# Patient Record
Sex: Male | Born: 1960 | Race: White | Hispanic: No | Smoking: Former smoker
Health system: Southern US, Community
[De-identification: ages and names within clinical notes are randomized; demographics above are authoritative.]

## PROBLEM LIST (undated history)

## (undated) DIAGNOSIS — Z79899 Other long term (current) drug therapy: Secondary | ICD-10-CM

## (undated) DIAGNOSIS — J449 Chronic obstructive pulmonary disease, unspecified: Secondary | ICD-10-CM

## (undated) DIAGNOSIS — I10 Essential (primary) hypertension: Secondary | ICD-10-CM

## (undated) DIAGNOSIS — E669 Obesity, unspecified: Secondary | ICD-10-CM

## (undated) DIAGNOSIS — Z5181 Encounter for therapeutic drug level monitoring: Secondary | ICD-10-CM

## (undated) DIAGNOSIS — G4733 Obstructive sleep apnea (adult) (pediatric): Secondary | ICD-10-CM

## (undated) DIAGNOSIS — I4819 Other persistent atrial fibrillation: Secondary | ICD-10-CM

## (undated) DIAGNOSIS — I5032 Chronic diastolic (congestive) heart failure: Secondary | ICD-10-CM

## (undated) HISTORY — DX: Other persistent atrial fibrillation: I48.19

## (undated) HISTORY — DX: Chronic diastolic (congestive) heart failure: I50.32

## (undated) HISTORY — DX: Obesity, unspecified: E66.9

## (undated) HISTORY — DX: Obstructive sleep apnea (adult) (pediatric): G47.33

## (undated) HISTORY — DX: Encounter for therapeutic drug level monitoring: Z79.899

## (undated) HISTORY — PX: HEMORROIDECTOMY: SUR656

## (undated) HISTORY — DX: Other long term (current) drug therapy: Z51.81

## (undated) HISTORY — PX: HERNIA REPAIR: SHX51

---

## 2015-10-29 ENCOUNTER — Ambulatory Visit: Payer: Self-pay | Admitting: Family Medicine

## 2015-11-09 ENCOUNTER — Ambulatory Visit: Payer: Self-pay | Admitting: Family Medicine

## 2016-12-15 ENCOUNTER — Other Ambulatory Visit: Payer: Self-pay | Admitting: Cardiology

## 2016-12-15 DIAGNOSIS — E669 Obesity, unspecified: Secondary | ICD-10-CM

## 2016-12-15 DIAGNOSIS — Z7901 Long term (current) use of anticoagulants: Secondary | ICD-10-CM

## 2016-12-15 DIAGNOSIS — I4819 Other persistent atrial fibrillation: Secondary | ICD-10-CM

## 2016-12-15 HISTORY — DX: Other persistent atrial fibrillation: I48.19

## 2016-12-15 HISTORY — DX: Obesity, unspecified: E66.9

## 2016-12-15 NOTE — H&P (Signed)
[name] [name], [name]    Date of visit:  12/15/2016 DOB:  [birth year]    Age:  56 yrs. Medical record number:  [id number]     Account number:  [id number] Primary Care Provider: Windle GuardELKINS, WILSON ____________________________ CURRENT DIAGNOSES  1. Dyspnea  2. Chest pain, unspecified  3. Persistent atrial fibrillation  4. COPD  5. Gastro-esophageal reflux disease  6. Obesity ____________________________ ALLERGIES  No Known Drug Allergies ____________________________ MEDICATIONS  1. Bevespi Aerosphere 9 mcg-4.8 mcg HFA aerosol inhaler, 2 puffs BID  2. carvedilol 12.5 mg tablet, take 2 tablets twice a day  3. flecainide 50 mg tablet, BID  4. pantoprazole 40 mg tablet,delayed release, 1 p.o. daily  5. Xarelto 20 mg tablet, 1 p.o. daily ____________________________ CHIEF COMPLAINTS  Followup of Persistent atrial fibrillation ____________________________ HISTORY OF PRESENT ILLNESS  Returns for cardiac followup. He was initially seen 3 weeks ago for evaluation of persistnet atrial fibrillation and echocardiogram showed EF of 55% with mild left atrial enlargement, mild LVH. No prior history of hypertension.He continues to complain of episodic dyspnea and remains in atrial fibrillation. He has been taking his carvedilol at a higher dose and states he has not missed any doses of anticoagulation. He is unclear how long his heart has been out of rhythm. He denies PND, orthopnea or significant edema. Has been anticoagulated now for close to one month. ____________________________ PAST HISTORY  Past Medical Illnesses:  GERD, COPD, erectile dysfunction, colitis, obesity;  Cardiovascular Illnesses:  angina (chronic), atrial fibrillation;  Infectious Diseases:  no previous history of significant infectious diseases;  Surgical Procedures:  hernia repair, hemmoroidectomy;  Trauma History:  no previous history of significant trauma;  NYHA Classification:  I;  Canadian Angina Classification:  Class 0: Asymptomatic;   Cardiology Procedures-Invasive:  no previous interventional or invasive cardiology procedures;  Cardiology Procedures-Noninvasive:  echocardiogram October 2018;  Peripheral Vascular Procedures:  no previous invasive peripheral vascular procedures.;  LVEF of 55% documented via echocardiogram on 12/01/2016,   ____________________________ CARDIO-PULMONARY TEST DATES EKG Date:  12/15/2016;  Echocardiography Date: 12/01/2016;   ____________________________ FAMILY HISTORY Brother -- Atrial fibrillation, Coronary artery bypass grafting, Brother alive with problem Brother -- Armed forces training and education officerAlive and well Father -- Father dead, COPD Mother -- Mother dead, COPD Sister -- Sister alive and well ____________________________ SOCIAL HISTORY Alcohol Use:  5-6 beers daily;  Smoking:  used to smoke but quit 2010, 30 pack year history;  Diet:  regular diet;  Lifestyle:  divorced and 1 daughter;  Exercise:  no regular exercise;  Occupation:  works part time;  Residence:  lives alone;   ____________________________ REVIEW OF SYSTEMS General:  obesity Ears, Nose, Throat, Mouth:  denies any hearing loss, epistaxis, hoarseness or difficulty speaking. Respiratory: denies dyspnea, cough, wheezing or hemoptysis. Cardiovascular:  please review HPI Abdominal: diarrhea Musculoskeletal:  denies arthritis, venous insufficiency, or muscle weakness Neurological:  denies headaches, stroke, or TIA  ____________________________ PHYSICAL EXAMINATION VITAL SIGNS  Blood Pressure:  114/70 Sitting, Left arm, large cuff  , 120/66 Standing, Left arm and large cuff   Pulse:  80/min. Weight:  222.00 lbs. Height:  70.00"BMI: 32  Constitutional:  pleasant white male in no acute distress, moderately obese Skin:  warm and dry to touch, no apparent skin lesions, or masses noted. Head:  normocephalic, normal hair pattern, no masses or tenderness Eyes:  EOMS Intact, PERRLA, C and S clear, Funduscopic exam not done. ENT:  ears, nose and throat reveal no  gross abnormalities.  Dentition good. Neck:  supple, without massess. No  JVD, thyromegaly or carotid bruits. Carotid upstroke normal. Chest:  normal symmetry, clear to auscultation. Cardiac:  irregular rhythm, normal S1 and S2, no S3 or S4, no murmur Abdomen:  abdomen soft,non-tender, no masses, no hepatospenomegaly, or aneurysm noted Peripheral Pulses:  the femoral,dorsalis pedis, and posterior tibial pulses are full and equal bilaterally with no bruits auscultated. Extremities & Back:  no deformities, clubbing, cyanosis, erythema or edema observed. Normal muscle strength and tone. Neurological:  no gross motor or sensory deficits noted, affect appropriate, oriented x3. ____________________________ IMPRESSIONS/PLAN  1. Persistent atrial fibrillation that is symptomatic 2. Long-term use of anticoagulation 3. Obesity  Recommendations:  I asked him to begin flecainide 50 mg twice daily.  He was also told to increase his carvedilol to 25 mg twice daily. We'll plan cardioversion next week to get him back into sinus rhythm. Cardioversion discussed with the patient including risks of stroke, arrhythmia, death, or anesthesia risks. The patient understands and is willing to proceed. ____________________________ TODAYS ORDERS  1. Comprehensive Metabolic Panel: Today  2. Complete Blood Count: Today  3. 12 Lead EKG: Today  4. Schedule cardioversion                       ____________________________ Cardiology Physician:  Darden PalmerW. Spencer Kindle Strohmeier, Jr. MD [facility]

## 2016-12-22 ENCOUNTER — Ambulatory Visit (HOSPITAL_COMMUNITY): Payer: Medicaid Other | Admitting: Certified Registered Nurse Anesthetist

## 2016-12-22 ENCOUNTER — Ambulatory Visit (HOSPITAL_COMMUNITY)
Admission: RE | Admit: 2016-12-22 | Discharge: 2016-12-22 | Disposition: A | Discharge status: Home / Self Care | Payer: Medicaid Other | Source: Ambulatory Visit | Attending: Cardiology | Admitting: Cardiology

## 2016-12-22 ENCOUNTER — Encounter (HOSPITAL_COMMUNITY)
Admission: RE | Disposition: A | Discharge status: Home / Self Care | Payer: Self-pay | Source: Ambulatory Visit | Attending: Cardiology

## 2016-12-22 ENCOUNTER — Encounter (HOSPITAL_COMMUNITY): Payer: Self-pay | Admitting: *Deleted

## 2016-12-22 DIAGNOSIS — E669 Obesity, unspecified: Secondary | ICD-10-CM

## 2016-12-22 DIAGNOSIS — I119 Hypertensive heart disease without heart failure: Secondary | ICD-10-CM | POA: Diagnosis not present

## 2016-12-22 DIAGNOSIS — K219 Gastro-esophageal reflux disease without esophagitis: Secondary | ICD-10-CM | POA: Diagnosis not present

## 2016-12-22 DIAGNOSIS — N529 Male erectile dysfunction, unspecified: Secondary | ICD-10-CM | POA: Insufficient documentation

## 2016-12-22 DIAGNOSIS — I517 Cardiomegaly: Secondary | ICD-10-CM | POA: Diagnosis not present

## 2016-12-22 DIAGNOSIS — Z7901 Long term (current) use of anticoagulants: Secondary | ICD-10-CM

## 2016-12-22 DIAGNOSIS — Z6831 Body mass index (BMI) 31.0-31.9, adult: Secondary | ICD-10-CM | POA: Diagnosis not present

## 2016-12-22 DIAGNOSIS — Z87891 Personal history of nicotine dependence: Secondary | ICD-10-CM | POA: Insufficient documentation

## 2016-12-22 DIAGNOSIS — Z836 Family history of other diseases of the respiratory system: Secondary | ICD-10-CM | POA: Insufficient documentation

## 2016-12-22 DIAGNOSIS — Z8249 Family history of ischemic heart disease and other diseases of the circulatory system: Secondary | ICD-10-CM | POA: Insufficient documentation

## 2016-12-22 DIAGNOSIS — Z79899 Other long term (current) drug therapy: Secondary | ICD-10-CM | POA: Insufficient documentation

## 2016-12-22 DIAGNOSIS — J449 Chronic obstructive pulmonary disease, unspecified: Secondary | ICD-10-CM | POA: Diagnosis not present

## 2016-12-22 DIAGNOSIS — I481 Persistent atrial fibrillation: Secondary | ICD-10-CM | POA: Diagnosis not present

## 2016-12-22 DIAGNOSIS — I4819 Other persistent atrial fibrillation: Secondary | ICD-10-CM

## 2016-12-22 HISTORY — DX: Chronic obstructive pulmonary disease, unspecified: J44.9

## 2016-12-22 HISTORY — DX: Essential (primary) hypertension: I10

## 2016-12-22 HISTORY — PX: CARDIOVERSION: SHX1299

## 2016-12-22 SURGERY — CARDIOVERSION
Anesthesia: General

## 2016-12-22 MED ORDER — PROPOFOL 10 MG/ML IV BOLUS
INTRAVENOUS | Status: DC | PRN
  Administered 2016-12-22: 50 mg via INTRAVENOUS
  Administered 2016-12-22: 100 mg via INTRAVENOUS

## 2016-12-22 MED ORDER — HYDROCORTISONE 1 % EX CREA: 1.0000 "application " | TOPICAL_CREAM | Freq: Three times a day (TID) | CUTANEOUS | Status: DC | PRN

## 2016-12-22 MED ORDER — SODIUM CHLORIDE 0.9 % IV SOLN
INTRAVENOUS | Status: DC
  Administered 2016-12-22: 13:00:00 via INTRAVENOUS

## 2016-12-22 MED ORDER — LIDOCAINE 2% (20 MG/ML) 5 ML SYRINGE
INTRAMUSCULAR | Status: DC | PRN
Start: 2016-12-22 — End: 2016-12-22
  Administered 2016-12-22: 20 mg via INTRAVENOUS

## 2016-12-22 NOTE — CV Procedure (Signed)
Electrical Cardioversion Procedure Note  [name] [name]   56 y.o. male MRN: [id number] DOB: 02/13/1960  Today's date: 12/22/2016  Procedure: Electrical Cardioversion  Indications:  Atrial Fibrillation  Time Out: Verified patient identification, verified procedure,medications/allergies/relevent history reviewed, required imaging and test results available.  Performed  Procedure Details  The patient was NPO after midnight. Anesthesia was administered at the beside  by Dr.Jackson with 20 mg of lidocaine and 100 mg and a subsequent bolus of 50 mg of propofol.  Cardioversion was done with synchronized biphasic defibrillation with AP pads with 150, 200 and 200 watts.  The patient failed to convert to sinus rhythm. The patient tolerated the procedure well   IMPRESSION:  Unsucessful cardioversion of atrial fibrillation    [name] [name], [name] HagemanJr. MD [facility]   12/22/2016, 1:25 PM

## 2016-12-22 NOTE — Anesthesia Postprocedure Evaluation (Signed)
Anesthesia Post Note  Patient: [name] [name]  Procedure(s) Performed: CARDIOVERSION (N/A )     Patient location during evaluation: Endoscopy Anesthesia Type: General Level of consciousness: awake and alert, patient cooperative and oriented Pain management: pain level controlled Vital Signs Assessment: post-procedure vital signs reviewed and stable Respiratory status: spontaneous breathing, nonlabored ventilation and respiratory function stable Cardiovascular status: blood pressure returned to baseline and stable (remains in afib) Postop Assessment: no apparent nausea or vomiting Anesthetic complications: no    Last Vitals:  Vitals:   12/22/16 1319 12/22/16 1334  BP:  107/64  Pulse:  71  Resp: 15 16  Temp:  36.5 C  SpO2:  97%    Last Pain:  Vitals:   12/22/16 1334  TempSrc: Oral                 [name] [name],[name] [name]

## 2016-12-22 NOTE — Anesthesia Preprocedure Evaluation (Addendum)
Anesthesia Evaluation  Patient identified by MRN, date of birth, ID band Patient awake    Reviewed: Allergy & Precautions, NPO status , Patient's Chart, lab work & pertinent test results, reviewed documented beta blocker date and time   History of Anesthesia Complications Negative for: history of anesthetic complications  Airway Mallampati: II  TM Distance: >3 FB Neck ROM: Full    Dental  (+) Upper Dentures, Poor Dentition, Loose, Chipped, Dental Advisory Given, Missing   Pulmonary COPD,  COPD inhaler, former smoker,    breath sounds clear to auscultation       Cardiovascular hypertension, Pt. on medications and Pt. on home beta blockers + dysrhythmias Atrial Fibrillation  Rhythm:Irregular Rate:Tachycardia  EF 55% (Dr. Donnie Ahoilley)   Neuro/Psych negative neurological ROS     GI/Hepatic Neg liver ROS, GERD  Medicated and Controlled,  Endo/Other  Morbid obesity  Renal/GU negative Renal ROS     Musculoskeletal   Abdominal (+) + obese,   Peds  Hematology Xarelto   Anesthesia Other Findings   Reproductive/Obstetrics                           Anesthesia Physical Anesthesia Plan  ASA: III  Anesthesia Plan: General   Post-op Pain Management:    Induction: Intravenous  PONV Risk Score and Plan: 2 and Treatment may vary due to age or medical condition  Airway Management Planned: Natural Airway and Mask  Additional Equipment:   Intra-op Plan:   Post-operative Plan:   Informed Consent: I have reviewed the patients History and Physical, chart, labs and discussed the procedure including the risks, benefits and alternatives for the proposed anesthesia with the patient or authorized representative who has indicated his/her understanding and acceptance.   Dental advisory given  Plan Discussed with: CRNA and Surgeon  Anesthesia Plan Comments: (Plan routine monitors, GA)        Anesthesia  Quick Evaluation

## 2016-12-22 NOTE — Transfer of Care (Signed)
Immediate Anesthesia Transfer of Care Note  Patient: [name] [name]  Procedure(s) Performed: CARDIOVERSION (N/A )  Patient Location: Endoscopy Unit  Anesthesia Type:General  Level of Consciousness: awake, alert  and oriented  Airway & Oxygen Therapy: Patient Spontanous Breathing  Post-op Assessment: Report given to RN and Post -op Vital signs reviewed and stable  Post vital signs: Reviewed and stable  Last Vitals:  Vitals:   12/22/16 1318 12/22/16 1319  BP:    Pulse: (!) 106   Resp: 14 15  Temp:    SpO2: 100%     Last Pain:  Vitals:   12/22/16 1204  TempSrc: Oral         Complications: No apparent anesthesia complications

## 2016-12-22 NOTE — Interval H&P Note (Signed)
History and Physical Interval Note:  12/22/2016 12:57 PM  [name] [name]  has presented today for surgery, with the diagnosis of AFIB  The various methods of treatment have been discussed with the patient and family. After consideration of risks, benefits and other options for treatment, the patient has consented to  Procedure(s): CARDIOVERSION (N/A) as a surgical intervention .  The patient's history has been reviewed, patient examined, no change in status, stable for surgery.  I have reviewed the patient's chart and labs.  Questions were answered to the patient's satisfaction.     [name] [name]

## 2016-12-22 NOTE — Discharge Instructions (Signed)
Electrical Cardioversion, Care After °This sheet gives you information about how to care for yourself after your procedure. Your health care provider may also give you more specific instructions. If you have problems or questions, contact your health care provider. °What can I expect after the procedure? °After the procedure, it is common to have: °· Some redness on the skin where the shocks were given. ° °Follow these instructions at home: °· Do not drive for 24 hours if you were given a medicine to help you relax (sedative). °· Take over-the-counter and prescription medicines only as told by your health care provider. °· Ask your health care provider how to check your pulse. Check it often. °· Rest for 48 hours after the procedure or as told by your health care provider. °· Avoid or limit your caffeine use as told by your health care provider. °Contact a health care provider if: °· You feel like your heart is beating too quickly or your pulse is not regular. °· You have a serious muscle cramp that does not go away. °Get help right away if: °· You have discomfort in your chest. °· You are dizzy or you feel faint. °· You have trouble breathing or you are short of breath. °· Your speech is slurred. °· You have trouble moving an arm or leg on one side of your body. °· Your fingers or toes turn cold or blue. °This information is not intended to replace advice given to you by your health care provider. Make sure you discuss any questions you have with your health care provider. °Document Released: 11/13/2012 Document Revised: 08/27/2015 Document Reviewed: 07/30/2015 °Elsevier Interactive Patient Education © 2018 [employer] ° °

## 2016-12-24 ENCOUNTER — Encounter (HOSPITAL_COMMUNITY): Payer: Self-pay | Admitting: Cardiology

## 2016-12-26 ENCOUNTER — Encounter: Payer: Self-pay | Admitting: Cardiology

## 2016-12-26 NOTE — Progress Notes (Signed)
[name] [name], [name] [name]    Date of visit:  12/26/2016 DOB:  09/04/1960    Age:  56 yrs. Medical record number:  [id number]     Account number:  [id number] Primary Care Provider: Windle GuardELKINS, WILSON ____________________________ CURRENT DIAGNOSES  1. Dyspnea  2. Chest pain, unspecified  3. Persistent atrial fibrillation  4. COPD  5. Gastro-esophageal reflux disease  6. Obesity ____________________________ ALLERGIES  No Known Drug Allergies ____________________________ MEDICATIONS  1. Bevespi Aerosphere 9 mcg-4.8 mcg HFA aerosol inhaler, 2 puffs BID  2. carvedilol 12.5 mg tablet, take 2 tablets twice a day  3. pantoprazole 40 mg tablet,delayed release, 1 p.o. daily PRN  4. Xarelto 20 mg tablet, 1 p.o. daily ____________________________ HISTORY OF PRESENT ILLNESS Returns for cardiac followup. He had cardioversion last week but was unsuccessful despite 3 shocks. After awakening from anesthesia he then admitted that he felt as if he could potentially have been out of rhythm as long as 3 or 4 years because he stated that his dyspnea was more chronic than he had originally let us to believe. He has mild left atrial enlargement. Mild LVH with normal systolic function. No bleeding complications anticoagulation. Remains obese. He has not had any previous study for sleep apnea. Still limited as to what he is able to do. Denies PND, orthopnea or edema. Tolerated cardioversion reasonably well. ____________________________ PAST HISTORY  Past Medical Illnesses:  GERD, COPD, erectile dysfunction, colitis, obesity;  Cardiovascular Illnesses:  atrial fibrillation;  Infectious Diseases:  no previous history of significant infectious diseases;  Surgical Procedures:  hernia repair, hemmoroidectomy;  Trauma History:  no previous history of significant trauma;  NYHA Classification:  I;  Canadian Angina Classification:  Class 0: Asymptomatic;  Cardiology Procedures-Invasive: cardioversion November 2018 Cardiology  Procedures-Noninvasive:  echocardiogram October 2018;  Peripheral Vascular Procedures:  no previous invasive peripheral vascular procedures.;  LVEF of 55% documented via echocardiogram on 12/01/2016,   ____________________________ CARDIO-PULMONARY TEST DATES EKG Date:  12/26/2016;  Echocardiography Date: 12/01/2016;   ____________________________ FAMILY HISTORY Brother -- Atrial fibrillation, Coronary artery bypass grafting, Brother alive with problem Brother -- Armed forces training and education officerAlive and well Father -- Father dead, COPD Mother -- Mother dead, COPD Sister -- Sister alive and well ____________________________ SOCIAL HISTORY Alcohol Use:  5-6 beers daily;  Smoking:  used to smoke but quit 2010, 30 pack year history;  Diet:  regular diet;  Lifestyle:  divorced and 1 daughter;  Exercise:  no regular exercise;  Occupation:  works part time;  Residence:  lives alone;   ____________________________ REVIEW OF SYSTEMS General:  obesity Ears, Nose, Throat, Mouth:  denies any hearing loss, epistaxis, hoarseness or difficulty speaking. Respiratory: denies dyspnea, cough, wheezing or hemoptysis. Cardiovascular:  please review HPI Abdominal: diarrhea Musculoskeletal:  denies arthritis, venous insufficiency, or muscle weakness Neurological:  denies headaches, stroke, or TIA  ____________________________ PHYSICAL EXAMINATION VITAL SIGNS  Blood Pressure:  100/72 Sitting, Left arm, large cuff  , 110/80 Standing, Left arm and large cuff   Pulse:  100/min. Weight:  224.00 lbs. Height:  70.00"BMI: 32  Constitutional:  pleasant white male in no acute distress, moderately obese Skin:  warm and dry to touch, no apparent skin lesions, or masses noted. Head:  normocephalic, normal hair pattern, no masses or tenderness Neck:  supple, without massess. No JVD, thyromegaly or carotid bruits. Carotid upstroke normal. Chest:  normal symmetry, clear to auscultation. Cardiac:  irregular rhythm, normal S1 and S2, no S3 or S4, no  murmur Extremities & Back:  no deformities, clubbing, cyanosis, erythema  or edema observed. Normal muscle strength and tone. Neurological:  no gross motor or sensory deficits noted, affect appropriate, oriented x3. ____________________________ IMPRESSIONS/PLAN 1. Persistent atrial fibrillation may have been long-standing 2. Obesity need to lose weight  3. COPD 4. Long-term use of anticoagulation  Recommendations:  Twelve-lead EKG personally reviewed by me today shows atrial fibrillation with controlled response, left axis deviation, nonspecific ST-T wave changes. We again discussed atrial fibrillation. He is fairly young and atrium is only mild to moderately enlarged. I would like for him to have a consultation with electrophysiologist to determine if we would be a candidate for additional antiarrhythmic drugs or ablation. He previously had been on flecainide prior to attempts at cardioversion and I discontinued that today. Continue anticoagulation. Follow up with me in 3 months. ____________________________ TODAYS ORDERS  1. 12 Lead EKG: Today  2. Sleep Study with c-pap: At Patient Convenience  3. Return Visit: 3 months                       ____________________________ Cardiology Physician:  Darden PalmerW. Spencer Gauri Galvao, Jr. MD [facility]

## 2016-12-27 ENCOUNTER — Other Ambulatory Visit (HOSPITAL_BASED_OUTPATIENT_CLINIC_OR_DEPARTMENT_OTHER): Payer: Self-pay

## 2016-12-27 DIAGNOSIS — I4819 Other persistent atrial fibrillation: Secondary | ICD-10-CM

## 2017-01-16 ENCOUNTER — Institutional Professional Consult (permissible substitution): Payer: Medicaid Other | Admitting: Cardiology

## 2017-01-22 ENCOUNTER — Ambulatory Visit (HOSPITAL_BASED_OUTPATIENT_CLINIC_OR_DEPARTMENT_OTHER): Payer: Medicaid Other | Attending: Cardiology | Admitting: Internal Medicine

## 2017-01-22 DIAGNOSIS — Z79899 Other long term (current) drug therapy: Secondary | ICD-10-CM | POA: Insufficient documentation

## 2017-01-22 DIAGNOSIS — R0683 Snoring: Secondary | ICD-10-CM | POA: Insufficient documentation

## 2017-01-22 DIAGNOSIS — Z7901 Long term (current) use of anticoagulants: Secondary | ICD-10-CM | POA: Diagnosis not present

## 2017-01-22 DIAGNOSIS — G4733 Obstructive sleep apnea (adult) (pediatric): Secondary | ICD-10-CM | POA: Diagnosis not present

## 2017-01-22 DIAGNOSIS — I4891 Unspecified atrial fibrillation: Secondary | ICD-10-CM | POA: Insufficient documentation

## 2017-01-22 DIAGNOSIS — Z6831 Body mass index (BMI) 31.0-31.9, adult: Secondary | ICD-10-CM | POA: Diagnosis not present

## 2017-01-22 DIAGNOSIS — G4736 Sleep related hypoventilation in conditions classified elsewhere: Secondary | ICD-10-CM | POA: Insufficient documentation

## 2017-01-22 DIAGNOSIS — I4819 Other persistent atrial fibrillation: Secondary | ICD-10-CM

## 2017-01-22 DIAGNOSIS — E669 Obesity, unspecified: Secondary | ICD-10-CM | POA: Diagnosis not present

## 2017-02-02 ENCOUNTER — Encounter: Payer: Self-pay | Admitting: *Deleted

## 2017-02-03 DIAGNOSIS — G4733 Obstructive sleep apnea (adult) (pediatric): Secondary | ICD-10-CM | POA: Diagnosis not present

## 2017-02-03 DIAGNOSIS — G4736 Sleep related hypoventilation in conditions classified elsewhere: Secondary | ICD-10-CM | POA: Diagnosis not present

## 2017-02-09 NOTE — Procedures (Signed)
    Patient Name: [name] [name], Shuayb Study Date: 01/22/2017 Gender: Male D.O.B: 18-Mar-1960 Age (years): 56 Referring Provider: Ellwood HandlerWilliam Tilley Height (inches): 71 Interpreting Physician: Jetty Duhamellinton Bernardina Cacho MD, ABSM Weight (lbs): 224 RPSGT: [name], [name] BMI: 31 MRN: [id number] Neck Size: 17.00 <br> <br> CLINICAL INFORMATION Sleep Study Type: NPSG  Indication for sleep study: Obesity  Epworth Sleepiness Score: 3  SLEEP STUDY TECHNIQUE As per the AASM Manual for the Scoring of Sleep and Associated Events v2.3 (April 2016) with a hypopnea requiring 4% desaturations.  The channels recorded and monitored were frontal, central and occipital EEG, electrooculogram (EOG), submentalis EMG (chin), nasal and oral airflow, thoracic and abdominal wall motion, anterior tibialis EMG, snore microphone, electrocardiogram, and pulse oximetry.  MEDICATIONS Medications self-administered by patient taken the night of the study : CARVEDILOL, XARELTO  SLEEP ARCHITECTURE The study was initiated at 11:08:47 PM and ended at 5:36:16 AM.  Sleep onset time was 89.6 minutes and the sleep efficiency was 68.4%. The total sleep time was 265.0 minutes.  Stage REM latency was 53.0 minutes.  The patient spent 29.81% of the night in stage N1 sleep, 54.91% in stage N2 sleep, 8.30% in stage N3 and 6.98% in REM.  Alpha intrusion was absent.  Supine sleep was 86.23%.  RESPIRATORY PARAMETERS The overall apnea/hypopnea index (AHI) was 24.5 per hour. There were 4 total apneas, including 4 obstructive, 0 central and 0 mixed apneas. There were 104 hypopneas and 64 RERAs.  The AHI during Stage REM sleep was 16.2 per hour.  AHI while supine was 28.4 per hour.  The mean oxygen saturation was 90.80%. The minimum SpO2 during sleep was 79.00%.  soft snoring was noted during this study.  CARDIAC DATA The 2 lead EKG demonstrated atrial fibrillation. The mean heart rate was 80.44 beats per minute. Other EKG findings  include: None . LEG MOVEMENT DATA The total PLMS were 0 with a resulting PLMS index of 0.00. Associated arousal with leg movement index was 0.0 .  IMPRESSIONS - Moderate obstructive sleep apnea occurred during this study (AHI = 24.5/h). - Insufficient early sleep ttime and eventss to meet protocol requirements for split CPAP titration on this study. - No significant central sleep apnea occurred during this study (CAI = 0.0/h). - Moderate oxygen desaturation was noted during this study (Min O2 = 79.00%, Mean 90.8%). - The patient snored with soft snoring volume. - No cardiac abnormalities were noted during this study. - Clinically significant periodic limb movements did not occur during sleep. No significant associated arousals.  DIAGNOSIS - Obstructive Sleep Apnea (327.23 [G47.33 ICD-10]) - Nocturnal Hypoxemia (327.26 [G47.36 ICD-10])  RECOMMENDATIONS - Therapeutic CPAP titration to determine optimal pressure required to alleviate sleep disordered breathing. - Positional therapy avoiding supine position during sleep. - Be careful with alcohol, sedatives and other CNS depressants that may worsen sleep apnea and disrupt normal sleep architecture. - Sleep hygiene should be reviewed to assess factors that may improve sleep quality. - Weight management and regular exercise should be initiated or continued if appropriate.  [Electronically signed] 02/03/2017 09:39 AM  Jetty Duhamellinton Garett Tetzloff MD, ABSM Diplomate, American Board of Sleep Medicine   NPI: [id number]                         Jetty Duhamellinton Ramia Sidney Diplomate, American Board of Sleep Medicine  ELECTRONICALLY SIGNED ON:  02/09/2017, 4:11 PM [facility] PH: [phone]   FX: [phone] ACCREDITED BY THE AMERICAN ACADEMY OF SLEEP MEDICINE

## 2017-02-27 NOTE — Progress Notes (Addendum)
Electrophysiology Office Note   Date:  02/28/2017   ID:  [name] DarterWendell Rothermel, DOB 03/27/1960, MRN [id number]  PCP:  Thomasene Lotpalski, Deborah, DO  Cardiologist:  Donnie Ahoilley Primary Electrophysiologist:  Kira Hartl Jorja LoaMartin Amantha Sklar, MD    Chief Complaint  Patient presents with  . Advice Only    Afib     History of Present Illness: [name] [name] is a 57 y.o. male who is being seen today for the evaluation of atrial fibrillation at the request of Viann FishSpencer Tilley. Presenting today for electrophysiology evaluation.  He has a history of GERD, COPD, obesity, and atrial fibrillation.  He had an attempted cardioversion on 12/22/16, but was not able to be converted to sinus rhythm.  After cardioversion attempts, the patient said that he had potentially been out of rhythm for 3 or 4 years.  He has mild left atrial enlargement with mild LVH and normal systolic function.    Today, he denies symptoms of palpitations, chest pain, lower extremity edema, claudication, dizziness, presyncope, syncope, bleeding, or neurologic sequela.  His main symptom today is of shortness of breath.  He got quite short of breath when walking into the office.  During the visit, he had an episode where he got hot, clammy, and dizzy.  This occurred during discussion of therapy for atrial fibrillation.  It appears that he had some vagal response.  This lasted approximately 5 minutes.  He continues to be short of breath with heart rates as high as 140 today on EKG.   Past Medical History:  Diagnosis Date  . COPD (chronic obstructive pulmonary disease) (HCC)   . Hypertension   . Obesity (BMI 30-39.9) 12/15/2016  . Persistent atrial fibrillation (HCC) 12/15/2016   Past Surgical History:  Procedure Laterality Date  . CARDIOVERSION N/A 12/22/2016   Procedure: CARDIOVERSION;  Surgeon: Othella Boyerilley, William S, MD;  [facility];  Service: Cardiovascular;  Laterality: N/A;  . HEMORROIDECTOMY    . HERNIA REPAIR       Current Outpatient  Medications  Medication Sig Dispense Refill  . carvedilol (COREG) 12.5 MG tablet Take 12.5 mg 2 (two) times daily by mouth.  12  . Glycopyrrolate-Formoterol (BEVESPI AEROSPHERE) 9-4.8 MCG/ACT AERO Inhale 1 puff daily into the lungs.    . pantoprazole (PROTONIX) 40 MG tablet Take 40 mg daily as needed by mouth. For acid reflux/indigestion  3  . XARELTO 20 MG TABS tablet Take 20 mg daily by mouth.  12   No current facility-administered medications for this visit.     Allergies:   Patient has no known allergies.   Social History:  The patient  reports that he has quit smoking. He has a 30.00 pack-year smoking history. he has never used smokeless tobacco. He reports that he drinks about 2.4 oz of alcohol per week. He reports that he does not use drugs.   Family History:  The patient's family history includes Atrial fibrillation in his brother; CAD in his brother; COPD in his father and mother.    ROS:  Please see the history of present illness.   Otherwise, review of systems is positive for shortness of breath, palpitations, abdominal pain, diarrhea, depression, anxiety, headaches.   All other systems are reviewed and negative.    PHYSICAL EXAM: VS:  BP 100/62   Pulse (!) 141   Ht 5\' 10"  (1.778 m)   Wt 220 lb (99.8 kg)   SpO2 91%   BMI 31.57 kg/m  , BMI Body mass index is 31.57 kg/m. GEN: Well nourished,  well developed, in no acute distress  HEENT: normal  Neck: no JVD, carotid bruits, or masses Cardiac: iRRR; no murmurs, rubs, or gallops,no edema  Respiratory:  clear to auscultation bilaterally, normal work of breathing GI: soft, nontender, nondistended, + BS MS: no deformity or atrophy  Skin: warm and dry Neuro:  Strength and sensation are intact Psych: euthymic mood, full affect  EKG:  EKG is ordered today. Personal review of the ekg ordered shows atrial fibrillation, rate 141  Recent Labs: No results found for requested labs within last 8760 hours.    Lipid Panel  No  results found for: CHOL, TRIG, HDL, CHOLHDL, VLDL, LDLCALC, LDLDIRECT   Wt Readings from Last 3 Encounters:  02/28/17 220 lb (99.8 kg)  01/22/17 224 lb (101.6 kg)  12/22/16 220 lb (99.8 kg)      Other studies Reviewed: Additional studies/ records that were reviewed today include: outside cardiology notes   LVEF of 55% documented via echocardiogram on 12/01/2016   ASSESSMENT AND PLAN:  1.  Long-standing persistent atrial fibrillation: Currently on carvedilol.  Not anticoagulated due to low stroke risk.  Due to his long history of atrial fibrillation, I feel that it is likely necessary for both drug and ablative therapy to potentially get him back into rhythm.  I did talk to him about the possibility of admission for tikosyn which he has agreed to.  We Yolette Hastings stop his carvedilol and start him on metoprolol, as this may help his low blood pressures.  We Rosaisela Jamroz also continue his Xarelto.  He has missed no doses.  Of note, he is failed flecainide.  It is unlikely therefore that any other class I antiarrhythmics would be beneficial.  This includes propafenone, disopyramide, mexiletine, and quinidine.  He would likely need a class III antiarrhythmic to maintain sinus rhythm.  Also, mexiletine and quinidine are not appropriate for atrial arrhythmias.  Disopyramide has a narrow spectrum of use and would not be appropriate for this patient.  This patients CHA2DS2-VASc Score and unadjusted Ischemic Stroke Rate (% per year) is equal to 0.2 % stroke rate/year from a score of 0  Above score calculated as 1 point each if present [CHF, HTN, DM, Vascular=MI/PAD/Aortic Plaque, Age if 65-74, or Male] Above score calculated as 2 points each if present [Age > 75, or Stroke/TIA/TE]    2. OSA: encouraged CPAP compliance  3.  Obesity: Weight loss encouraged  Current medicines are reviewed at length with the patient today.   The patient does not have concerns regarding his medicines.  The following changes  were made today: Stop carvedilol, start metoprolol  Labs/ tests ordered today include:  Orders Placed This Encounter  Procedures  . EKG 12-Lead     Disposition:   FU with Jamisen Duerson 3 months  Signed, Zola Runion Jorja Loa, MD  02/28/2017 9:21 AM     [facility] [address] [place] [address] [phone] (office) [phone] (fax)

## 2017-02-28 ENCOUNTER — Telehealth: Payer: Self-pay | Admitting: Cardiology

## 2017-02-28 ENCOUNTER — Ambulatory Visit: Payer: Medicaid Other | Admitting: Cardiology

## 2017-02-28 ENCOUNTER — Encounter: Payer: Self-pay | Admitting: Cardiology

## 2017-02-28 VITALS — BP 100/62 | HR 141 | Ht 70.0 in | Wt 220.0 lb

## 2017-02-28 DIAGNOSIS — G4733 Obstructive sleep apnea (adult) (pediatric): Secondary | ICD-10-CM | POA: Diagnosis not present

## 2017-02-28 DIAGNOSIS — I4811 Longstanding persistent atrial fibrillation: Secondary | ICD-10-CM

## 2017-02-28 DIAGNOSIS — I481 Persistent atrial fibrillation: Secondary | ICD-10-CM

## 2017-02-28 MED ORDER — METOPROLOL TARTRATE 50 MG PO TABS: 75.0000 mg | ORAL_TABLET | Freq: Two times a day (BID) | ORAL | 3 refills | Status: DC

## 2017-02-28 NOTE — Patient Instructions (Signed)
Medication Instructions:  Your physician has recommended you make the following change in your medication: 1. STOP Carvedilol (Coreg) 2. START Metoprolol Tartrate (Lopressor) 75 mg twice daily  * If you need a refill on your cardiac medications before your next appointment, please call your pharmacy. *  Labwork: None ordered  Testing/Procedures: None ordered  Follow-Up: Your physician recommends that you schedule a follow-up appointment in: 3 months with Dr. Elberta Fortisamnitz.  Thank you for choosing [facility]!!   Dory HornSherri Price, RN [phone]  Any Other Special Instructions Will Be Listed Below (If Applicable).  Call your insurance and check on the cost of Tikosyn (dofetilide).  Please call the nurse Dory Horn(Sherri Price, RN) and let her know if you can afford this medication. If you are able to afford this, then the plan will be for 3 day hospital admission next week.

## 2017-02-28 NOTE — Telephone Encounter (Signed)
Follow Up:    Pt says his insurance will pay for his Tikosyn. The lady told him that somebody from Dr RadioShackCamnitz's office will need to call and verify that he needs this medicine.The phone number is [phone].

## 2017-03-02 ENCOUNTER — Telehealth: Payer: Self-pay | Admitting: Cardiology

## 2017-03-02 NOTE — Telephone Encounter (Signed)
New Message   Patient is calling about his upcoming hospital admission. Please call to discuss.

## 2017-03-02 NOTE — Telephone Encounter (Signed)
Called [employer] at [phone] to get PA started for Dofetilide. They needed prior failed medication information that I do not have.  Will attempt to obtain and/or review w/ Dr. Elberta Fortisamnitz to determine next step

## 2017-03-02 NOTE — Telephone Encounter (Signed)
Left message informing patient I was working on PA for [employer].  Explained that insurance is requiring PA and that he has tried and failed 2 other drugs before they will approve this one.  He understands Dr. Elberta Fortisamnitz is not in the office today to review further.  Notified patient that I would call him next week to update him on plan of care.

## 2017-03-14 ENCOUNTER — Telehealth: Payer: Self-pay | Admitting: Pharmacist

## 2017-03-14 ENCOUNTER — Telehealth: Payer: Self-pay | Admitting: *Deleted

## 2017-03-14 NOTE — Telephone Encounter (Signed)
Pt agreeable to Tikosyn adx. Scheduled to see Rudi CocoDonna Carroll in the AFib clinic on Monday, 2/11 for labs/adx. Code for parking garage, to Afib clinic, given to patient. Confirmed no missed Xarelto doses in the last 3-4 weeks. Patient verbalized understanding and agreeable to plan.

## 2017-03-14 NOTE — Telephone Encounter (Signed)
Dr. Elberta Fortisamnitz addended office note to reflect why pt can't take other medications (Norpace, Mexiletine, Propafenone & Quinidine) that Medicaid was requiring prior to covering Tikosyn.

## 2017-03-14 NOTE — Telephone Encounter (Addendum)
Medication list reviewed in anticipation of upcoming Tikosyn initiation. Patient is taking Bevespi which can be Qtc prolonging, but is not contraindicated with Tikosyn. Patient is not taking any other contraindicated or QTc prolonging medications. Patient previously on Flecainide and it appears that this was stopped prior to his visit with Dr. Elberta Fortisamnitz on 02/28/17.   Patient is anticoagulated on Xarelto 20mg  daily. We are unable to determine if this is the appropriate dose as there is no BMET in our system or care everywhere. Please ensure that patient has not missed any anticoagulation doses in the 3 weeks prior to Tikosyn initiation.   Patient will need to be counseled to avoid use of Benadryl while on Tikosyn and in the 2-3 days prior to Tikosyn initiation.

## 2017-03-19 ENCOUNTER — Encounter (HOSPITAL_COMMUNITY): Payer: Self-pay | Admitting: Nurse Practitioner

## 2017-03-19 ENCOUNTER — Inpatient Hospital Stay (HOSPITAL_COMMUNITY)
Admission: RE | Admit: 2017-03-19 | Discharge: 2017-03-22 | DRG: 310 | Disposition: A | Discharge status: Home / Self Care | Payer: Medicaid Other | Source: Ambulatory Visit | Attending: Internal Medicine | Admitting: Internal Medicine

## 2017-03-19 ENCOUNTER — Ambulatory Visit (HOSPITAL_COMMUNITY)
Admission: RE | Admit: 2017-03-19 | Discharge: 2017-03-19 | Disposition: A | Discharge status: Home / Self Care | Payer: Medicaid Other | Source: Ambulatory Visit | Attending: Nurse Practitioner | Admitting: Nurse Practitioner

## 2017-03-19 ENCOUNTER — Other Ambulatory Visit: Payer: Self-pay

## 2017-03-19 VITALS — BP 128/80 | HR 101 | Ht 70.0 in | Wt 215.0 lb

## 2017-03-19 DIAGNOSIS — Z79899 Other long term (current) drug therapy: Secondary | ICD-10-CM

## 2017-03-19 DIAGNOSIS — Z87891 Personal history of nicotine dependence: Secondary | ICD-10-CM | POA: Diagnosis not present

## 2017-03-19 DIAGNOSIS — I10 Essential (primary) hypertension: Secondary | ICD-10-CM | POA: Diagnosis present

## 2017-03-19 DIAGNOSIS — E669 Obesity, unspecified: Secondary | ICD-10-CM | POA: Diagnosis present

## 2017-03-19 DIAGNOSIS — I4811 Longstanding persistent atrial fibrillation: Secondary | ICD-10-CM

## 2017-03-19 DIAGNOSIS — G4733 Obstructive sleep apnea (adult) (pediatric): Secondary | ICD-10-CM | POA: Diagnosis present

## 2017-03-19 DIAGNOSIS — I4892 Unspecified atrial flutter: Secondary | ICD-10-CM | POA: Diagnosis present

## 2017-03-19 DIAGNOSIS — I481 Persistent atrial fibrillation: Principal | ICD-10-CM | POA: Diagnosis present

## 2017-03-19 DIAGNOSIS — I4819 Other persistent atrial fibrillation: Secondary | ICD-10-CM | POA: Diagnosis present

## 2017-03-19 DIAGNOSIS — Z8249 Family history of ischemic heart disease and other diseases of the circulatory system: Secondary | ICD-10-CM

## 2017-03-19 DIAGNOSIS — Z6830 Body mass index (BMI) 30.0-30.9, adult: Secondary | ICD-10-CM | POA: Diagnosis not present

## 2017-03-19 DIAGNOSIS — J449 Chronic obstructive pulmonary disease, unspecified: Secondary | ICD-10-CM | POA: Diagnosis present

## 2017-03-19 DIAGNOSIS — Z7901 Long term (current) use of anticoagulants: Secondary | ICD-10-CM

## 2017-03-19 LAB — BASIC METABOLIC PANEL
Anion gap: 10 (ref 5–15)
BUN: 6 mg/dL (ref 6–20)
CHLORIDE: 103 mmol/L (ref 101–111)
CO2: 25 mmol/L (ref 22–32)
Calcium: 8.7 mg/dL — ABNORMAL LOW (ref 8.9–10.3)
Creatinine, Ser: 0.93 mg/dL (ref 0.61–1.24)
GFR calc Af Amer: >60 mL/min (ref >60)
GLUCOSE: 95 mg/dL (ref 65–99)
POTASSIUM: 4.1 mmol/L (ref 3.5–5.1)
Sodium: 138 mmol/L (ref 135–145)

## 2017-03-19 LAB — MAGNESIUM: Magnesium: 1.9 mg/dL (ref 1.7–2.4)

## 2017-03-19 MED ORDER — DOFETILIDE 500 MCG PO CAPS
500.0000 ug | ORAL_CAPSULE | Freq: Two times a day (BID) | ORAL | Status: DC
  Administered 2017-03-19 – 2017-03-22 (×6): 500 ug via ORAL
  Filled 2017-03-19 (×6): qty 1

## 2017-03-19 MED ORDER — ONDANSETRON HCL 4 MG/2ML IJ SOLN: 4.0000 mg | Freq: Four times a day (QID) | INTRAMUSCULAR | Status: DC | PRN

## 2017-03-19 MED ORDER — SODIUM CHLORIDE 0.9 % IV SOLN: 250.0000 mL | INTRAVENOUS | Status: DC | PRN

## 2017-03-19 MED ORDER — RIVAROXABAN 20 MG PO TABS
20.0000 mg | ORAL_TABLET | Freq: Every day | ORAL | Status: DC
  Administered 2017-03-20 – 2017-03-22 (×3): 20 mg via ORAL
  Filled 2017-03-19 (×3): qty 1

## 2017-03-19 MED ORDER — ACETAMINOPHEN 325 MG PO TABS: 650.0000 mg | ORAL_TABLET | ORAL | Status: DC | PRN

## 2017-03-19 MED ORDER — PANTOPRAZOLE SODIUM 40 MG PO TBEC: 40.0000 mg | DELAYED_RELEASE_TABLET | Freq: Every day | ORAL | Status: DC | PRN

## 2017-03-19 MED ORDER — SODIUM CHLORIDE 0.9% FLUSH: 3.0000 mL | INTRAVENOUS | Status: DC | PRN

## 2017-03-19 MED ORDER — GUAIFENESIN-DM 100-10 MG/5ML PO SYRP
5.0000 mL | ORAL_SOLUTION | ORAL | Status: DC | PRN
  Administered 2017-03-19 – 2017-03-21 (×3): 5 mL via ORAL
  Filled 2017-03-19 (×3): qty 5

## 2017-03-19 MED ORDER — FUROSEMIDE 10 MG/ML IJ SOLN
20.0000 mg | Freq: Once | INTRAMUSCULAR | Status: AC
  Administered 2017-03-19: 20 mg via INTRAVENOUS
  Filled 2017-03-19: qty 2

## 2017-03-19 MED ORDER — METOPROLOL TARTRATE 50 MG PO TABS
75.0000 mg | ORAL_TABLET | Freq: Two times a day (BID) | ORAL | Status: DC
  Administered 2017-03-20 – 2017-03-22 (×4): 75 mg via ORAL
  Filled 2017-03-19 (×6): qty 1

## 2017-03-19 MED ORDER — METOPROLOL TARTRATE 50 MG PO TABS: 75.0000 mg | ORAL_TABLET | Freq: Two times a day (BID) | ORAL | Status: DC

## 2017-03-19 MED ORDER — METOPROLOL TARTRATE 50 MG PO TABS
75.0000 mg | ORAL_TABLET | Freq: Once | ORAL | Status: AC
  Administered 2017-03-19: 75 mg via ORAL
  Filled 2017-03-19: qty 1

## 2017-03-19 MED ORDER — SODIUM CHLORIDE 0.9% FLUSH
3.0000 mL | Freq: Two times a day (BID) | INTRAVENOUS | Status: DC
  Administered 2017-03-19 – 2017-03-21 (×4): 3 mL via INTRAVENOUS

## 2017-03-19 NOTE — Plan of Care (Signed)
  Completed/[facility] Behavior/Discharge Planning: Ability to manage health-related needs will improve 03/19/2017 2020 - Completed/Met by Ocie Cornfield, RN Nutrition: Adequate nutrition will be maintained 03/19/2017 2020 - Completed/Met by Ocie Cornfield, RN Coping: Level of anxiety will decrease 03/19/2017 2020 - Completed/Met by Ocie Cornfield, RN Elimination: Will not experience complications related to bowel motility 03/19/2017 2020 - Completed/Met by Ocie Cornfield, RN Will not experience complications related to urinary retention 03/19/2017 2020 - Completed/Met by Ocie Cornfield, RN Pain Managment: General experience of comfort will improve 03/19/2017 2020 - Completed/Met by Ocie Cornfield, RN

## 2017-03-19 NOTE — Progress Notes (Addendum)
Direct admission for Tikosyn Load. HR between 120-140s. BP 109/73. Patient just c/o a cough. Bilateral lower bases- mild wheezing. Paged NP Glory BuffSeiler, stated the patient has persistent afibb, and will start Tikosyn tonight, along with scheduled metoprolol. To watch patient. Ordered 1 time dose of IV lasix 20 mg IV push. Will continue to monitor.

## 2017-03-19 NOTE — H&P (Signed)
Primary Care Physician: Thomasene Lotpalski, Deborah, DO Referring Physician: Dr. Gerrie Nordmannamnitz   [name] [name] is a 57 y.o. male with a h/o persistent afib of questionable duration but possibly 2-3 years. He recently failed cardioversion. He is in the afib clinic to come in for Tikosyn. He is aware of the cost of drug, but shows some concern regarding how specific he will have to be taking the drug. No missed doses of xarelto, has not used any benadryl.  Today, he denies symptoms of palpitations, chest pain, shortness of breath, orthopnea, PND, lower extremity edema, dizziness, presyncope, syncope, or neurologic sequela. The patient is tolerating medications without difficulties and is otherwise without complaint today.       Past Medical History:  Diagnosis Date  . COPD (chronic obstructive pulmonary disease) (HCC)   . Hypertension   . Obesity (BMI 30-39.9) 12/15/2016  . Persistent atrial fibrillation (HCC) 12/15/2016        Past Surgical History:  Procedure Laterality Date  . CARDIOVERSION N/A 12/22/2016   Procedure: CARDIOVERSION;  Surgeon: Othella Boyerilley, William S, MD;  [facility];  Service: Cardiovascular;  Laterality: N/A;  . HEMORROIDECTOMY    . HERNIA REPAIR            Current Outpatient Medications  Medication Sig Dispense Refill  . chlorpheniramine (CHLOR-TRIMETON) 4 MG tablet Take 4 mg by mouth 2 (two) times daily as needed for allergies.    . Glycopyrrolate-Formoterol (BEVESPI AEROSPHERE) 9-4.8 MCG/ACT AERO Inhale 1 puff daily into the lungs.    . metoprolol tartrate (LOPRESSOR) 50 MG tablet Take 1.5 tablets (75 mg total) by mouth 2 (two) times daily. 90 tablet 3  . pantoprazole (PROTONIX) 40 MG tablet Take 40 mg daily as needed by mouth. For acid reflux/indigestion  3  . XARELTO 20 MG TABS tablet Take 20 mg daily by mouth.  12   No current facility-administered medications for this encounter.     No Known Allergies  Social History         Socioeconomic History  . Marital status: Divorced    Spouse name: Not on file  . Number of children: Not on file  . Years of education: Not on file  . Highest education level: Not on file  Social Needs  . Financial resource strain: Not on file  . Food insecurity - worry: Not on file  . Food insecurity - inability: Not on file  . Transportation needs - medical: Not on file  . Transportation needs - non-medical: Not on file  Occupational History  . Not on file  Tobacco Use  . Smoking status: Former Smoker    Packs/day: 1.00    Years: 30.00    Pack years: 30.00  . Smokeless tobacco: Never Used  Substance and Sexual Activity  . Alcohol use: Yes    Alcohol/week: 2.4 oz    Types: 4 Cans of beer per week  . Drug use: No  . Sexual activity: Not on file  Other Topics Concern  . Not on file  Social History Narrative  . Not on file         Family History  Problem Relation Age of Onset  . COPD Mother   . COPD Father   . Atrial fibrillation Brother   . CAD Brother     ROS- All systems are reviewed and negative except as per the HPI above  Physical Exam:    Vitals:   03/19/17 1049  BP: 128/80  Pulse: (!) 101  SpO2: 94%  Weight: 215 lb (97.5 kg)  Height: 5\' 10"  (1.778 m)      Wt Readings from Last 3 Encounters:  03/19/17 215 lb (97.5 kg)  02/28/17 220 lb (99.8 kg)  01/22/17 224 lb (101.6 kg)    Labs: RecentLabs  No results found for: NA, K, CL, CO2, GLUCOSE, BUN, CREATININE, CALCIUM, PHOS, MG   RecentLabs  No results found for: INR   RecentLabs  No results found for: CHOL, HDL, LDLCALC, TRIG     GEN- The patient is well appearing, alert and oriented x 3 today.   Head- normocephalic, atraumatic Eyes-  Sclera clear, conjunctiva pink Ears- hearing intact Oropharynx- clear Neck- supple, no JVP Lymph- no cervical lymphadenopathy Lungs- Clear to ausculation bilaterally, normal work of breathing Heart- irregular rate and  rhythm, no murmurs, rubs or gallops, PMI not laterally displaced GI- soft, NT, ND, + BS Extremities- no clubbing, cyanosis, or edema MS- no significant deformity or atrophy Skin- no rash or lesion Psych- euthymic mood, full affect Neuro- strength and sensation are intact  EKG- afib at 101 ms,  qrs int 86 ms, qtc 461 ms Epic records reviewed    Assessment and Plan: 1. Persistent  afib  Here for admission for Tikosyn Precautions re use of Tikosyn reviewed He has a few concerns if he will be able to take drug consistently No benadryl use Has not missed any doses of xarelto 20 mg daily for a chadvasc score of at least 1(htn) Can afford drug Drugs scanned by pharmD, notes by Prudence Davidson ,Patient is taking Bevespi which can be Qtc prolonging, but is not contraindicated with Tikosyn.Patient isnottaking any othercontraindicated or QTc prolonging medications.Patient previously on Flecainideand it appears that this was stopped prior to his visit with Dr. Elberta Fortis on 02/28/17.  Bmet/mag today show K+ at 4.1/mag at 1.0 with crcl cal at 123.80 To be admitted per bed availability today   Lupita Leash C. Matthew Folks [facility] [address] [place], [place] [address] [phone]    EP Attending  Patient seen and examined. Agree with the findings as noted above. He is in atrial fib with an RVR but is not in distress on exam. I have outlined the treatment and reminded the patient that he will undergo DCCV if he has not reverted back to NSR. Plan more beta blocker for rate control.  Leonia Reeves.D.

## 2017-03-19 NOTE — Progress Notes (Signed)
Primary Care Physician: Thomasene Lot, DO Referring Physician: Dr. Gerrie Nordmann is a 57 y.o. male with a h/o persistent afib of questionable duration but possibly 2-3 years. He recently failed cardioversion. He is in the afib clinic to come in for Tikosyn. He is aware of the cost of drug, but shows some concern regarding how specific he will have to be taking the drug. No missed doses of xarelto, has not used any benadryl.  Today, he denies symptoms of palpitations, chest pain, shortness of breath, orthopnea, PND, lower extremity edema, dizziness, presyncope, syncope, or neurologic sequela. The patient is tolerating medications without difficulties and is otherwise without complaint today.   Past Medical History:  Diagnosis Date  . COPD (chronic obstructive pulmonary disease) (HCC)   . Hypertension   . Obesity (BMI 30-39.9) 12/15/2016  . Persistent atrial fibrillation (HCC) 12/15/2016   Past Surgical History:  Procedure Laterality Date  . CARDIOVERSION N/A 12/22/2016   Procedure: CARDIOVERSION;  Surgeon: Othella Boyer, MD;  Location: [facility] ENDOSCOPY;  Service: Cardiovascular;  Laterality: N/A;  . HEMORROIDECTOMY    . HERNIA REPAIR      Current Outpatient Medications  Medication Sig Dispense Refill  . chlorpheniramine (CHLOR-TRIMETON) 4 MG tablet Take 4 mg by mouth 2 (two) times daily as needed for allergies.    . Glycopyrrolate-Formoterol (BEVESPI AEROSPHERE) 9-4.8 MCG/ACT AERO Inhale 1 puff daily into the lungs.    . metoprolol tartrate (LOPRESSOR) 50 MG tablet Take 1.5 tablets (75 mg total) by mouth 2 (two) times daily. 90 tablet 3  . pantoprazole (PROTONIX) 40 MG tablet Take 40 mg daily as needed by mouth. For acid reflux/indigestion  3  . XARELTO 20 MG TABS tablet Take 20 mg daily by mouth.  12   No current facility-administered medications for this encounter.     No Known Allergies  Social History   Socioeconomic History  . Marital status: Divorced   Spouse name: Not on file  . Number of children: Not on file  . Years of education: Not on file  . Highest education level: Not on file  Social Needs  . Financial resource strain: Not on file  . Food insecurity - worry: Not on file  . Food insecurity - inability: Not on file  . Transportation needs - medical: Not on file  . Transportation needs - non-medical: Not on file  Occupational History  . Not on file  Tobacco Use  . Smoking status: Former Smoker    Packs/day: 1.00    Years: 30.00    Pack years: 30.00  . Smokeless tobacco: Never Used  Substance and Sexual Activity  . Alcohol use: Yes    Alcohol/week: 2.4 oz    Types: 4 Cans of beer per week  . Drug use: No  . Sexual activity: Not on file  Other Topics Concern  . Not on file  Social History Narrative  . Not on file    Family History  Problem Relation Age of Onset  . COPD Mother   . COPD Father   . Atrial fibrillation Brother   . CAD Brother     ROS- All systems are reviewed and negative except as per the HPI above  Physical Exam: Vitals:   03/19/17 1049  BP: 128/80  Pulse: (!) 101  SpO2: 94%  Weight: 215 lb (97.5 kg)  Height: 5\' 10"  (1.778 m)   Wt Readings from Last 3 Encounters:  03/19/17 215 lb (97.5 kg)  02/28/17 220 lb (  99.8 kg)  01/22/17 224 lb (101.6 kg)    Labs: No results found for: NA, K, CL, CO2, GLUCOSE, BUN, CREATININE, CALCIUM, PHOS, MG No results found for: INR No results found for: CHOL, HDL, LDLCALC, TRIG   GEN- The patient is well appearing, alert and oriented x 3 today.   Head- normocephalic, atraumatic Eyes-  Sclera clear, conjunctiva pink Ears- hearing intact Oropharynx- clear Neck- supple, no JVP Lymph- no cervical lymphadenopathy Lungs- Clear to ausculation bilaterally, normal work of breathing Heart- irregular rate and rhythm, no murmurs, rubs or gallops, PMI not laterally displaced GI- soft, NT, ND, + BS Extremities- no clubbing, cyanosis, or edema MS- no significant  deformity or atrophy Skin- no rash or lesion Psych- euthymic mood, full affect Neuro- strength and sensation are intact  EKG- afib at 101 ms,  qrs int 86 ms, qtc 461 ms Epic records reviewed    Assessment and Plan: 1. Persistent  afib  Here for admission for Tikosyn Precautions re use of Tikosyn reviewed He has a few concerns if he will be able to take drug consistently No benadryl use Has not missed any doses of xarelto 20 mg daily for a chadvasc score of at least 1(htn) Can afford drug Drugs scanned by pharmD, notes by Prudence DavidsonKelley Auten ,Patient is taking Bevespi which can be Qtc prolonging, but is not contraindicated with Tikosyn. Patient is not taking any other contraindicated or QTc prolonging medications. Patient previously on Flecainide and it appears that this was stopped prior to his visit with Dr. Elberta Fortisamnitz on 02/28/17.   Bmet/mag today show K+ at 4.1/mag at 1.0 with crcl cal at 123.80 To be admitted per bed availability today   Lupita LeashDonna C. Matthew Folksarroll, ANP-C [facility]  [address] [place], [place] [id number] [phone]

## 2017-03-19 NOTE — Progress Notes (Signed)
Pharmacy Review for Dofetilide (Tikosyn) Initiation  Admit Complaint: 57 y.o. male admitted 03/19/2017 with atrial fibrillation to be initiated on dofetilide.   Assessment:  Patient Exclusion Criteria: If any screening criteria checked as "Yes", then  patient  should NOT receive dofetilide until criteria item is corrected. If "Yes" please indicate correction plan.  YES  NO Patient  Exclusion Criteria Correction Plan  [x]  []  Baseline QTc interval is greater than or equal to 440 msec. IF above YES box checked dofetilide contraindicated unless patient has ICD; then may proceed if QTc 500-550 msec or with known ventricular conduction abnormalities may proceed with QTc 550-600 msec. QTc = 461 MD aware  []  [x]  Magnesium level is less than 1.8 mEq/l : Last magnesium:  Lab Results  Component Value Date   MG 1.9 03/19/2017         []  [x]  Potassium level is less than 4 mEq/l : Last potassium:  Lab Results  Component Value Date   K 4.1 03/19/2017         []  [x]  Patient is known or suspected to have a digoxin level greater than 2 ng/ml: No results found for: DIGOXIN    []  [x]  Creatinine clearance less than 20 ml/min (calculated using Cockcroft-Gault, actual body weight and serum creatinine): Estimated Creatinine Clearance: 105.1 mL/min (by C-G formula based on SCr of 0.93 mg/dL).    []  [x]  Patient has received drugs known to prolong the QT intervals within the last 48 hours (phenothiazines, tricyclics or tetracyclic antidepressants, erythromycin, H-1 antihistamines, cisapride, fluoroquinolones, azithromycin). Drugs not listed above may have an, as yet, undetected potential to prolong the QT interval, updated information on QT prolonging agents is available at this website:QT prolonging agents   []  [x]  Patient received a dose of hydrochlorothiazide (Oretic) alone or in any combination including triamterene (Dyazide, Maxzide) in the last 48 hours.   []  [x]  Patient received a medication known to  increase dofetilide plasma concentrations prior to initial dofetilide dose:  . Trimethoprim (Primsol, Proloprim) in the last 36 hours . Verapamil (Calan, Verelan) in the last 36 hours or a sustained release dose in the last 72 hours . Megestrol (Megace) in the last 5 days  . Cimetidine (Tagamet) in the last 6 hours . Ketoconazole (Nizoral) in the last 24 hours . Itraconazole (Sporanox) in the last 48 hours  . Prochlorperazine (Compazine) in the last 36 hours    []  [x]  Patient is known to have a history of torsades de pointes; congenital or acquired long QT syndromes.   []  [x]  Patient has received a Class 1 antiarrhythmic with less than 2 half-lives since last dose. (Disopyramide, Quinidine, Procainamide, Lidocaine, Mexiletine, Flecainide, Propafenone)   []  [x]  Patient has received amiodarone therapy in the past 3 months or amiodarone level is greater than 0.3 ng/ml.    Patient has been appropriately anticoagulated with xarelto.  Ordering provider was confirmed at [email or web address] if they are not listed on the [facility] Authorized Prescribers list.  Goal of Therapy: Follow renal function, electrolytes, potential drug interactions, and dose adjustment. Provide education and 1 week supply at discharge.  Plan:  [x]   Physician selected initial dose within range recommended for patients level of renal function - will monitor for response.  []   Physician selected initial dose outside of range recommended for patients level of renal function - will discuss if the dose should be altered at this time.   Select One Calculated CrCl  Dose q12h  [x]  > 60 ml/min 500 mcg  []   40-60 ml/min 250 mcg  []  20-40 ml/min 125 mcg   2. Follow up QTc after the first 5 doses, renal function, electrolytes (K & Mg) daily x 3     days, dose adjustment, success of initiation and facilitate 1 week discharge supply as     clinically indicated.    Severiano GilbertWilson, Frank Rhea 3:49 PM 03/19/2017

## 2017-03-20 LAB — BASIC METABOLIC PANEL
ANION GAP: 13 (ref 5–15)
BUN: 13 mg/dL (ref 6–20)
CALCIUM: 9.1 mg/dL (ref 8.9–10.3)
CO2: 27 mmol/L (ref 22–32)
Chloride: 99 mmol/L — ABNORMAL LOW (ref 101–111)
Creatinine, Ser: 1.13 mg/dL (ref 0.61–1.24)
GLUCOSE: 103 mg/dL — AB (ref 65–99)
Potassium: 3.7 mmol/L (ref 3.5–5.1)
Sodium: 139 mmol/L (ref 135–145)

## 2017-03-20 LAB — HIV ANTIBODY (ROUTINE TESTING W REFLEX): HIV SCREEN 4TH GENERATION: NONREACTIVE

## 2017-03-20 LAB — MAGNESIUM: Magnesium: 1.8 mg/dL (ref 1.7–2.4)

## 2017-03-20 MED ORDER — MAGNESIUM OXIDE 400 (241.3 MG) MG PO TABS
400.0000 mg | ORAL_TABLET | Freq: Every day | ORAL | Status: DC
  Administered 2017-03-20 – 2017-03-22 (×3): 400 mg via ORAL
  Filled 2017-03-20 (×3): qty 1

## 2017-03-20 MED ORDER — POTASSIUM CHLORIDE CRYS ER 20 MEQ PO TBCR
40.0000 meq | EXTENDED_RELEASE_TABLET | Freq: Once | ORAL | Status: AC
  Administered 2017-03-20: 40 meq via ORAL
  Filled 2017-03-20: qty 2

## 2017-03-20 MED ORDER — MOMETASONE FURO-FORMOTEROL FUM 100-5 MCG/ACT IN AERO
2.0000 | INHALATION_SPRAY | Freq: Two times a day (BID) | RESPIRATORY_TRACT | Status: DC
  Administered 2017-03-20 – 2017-03-22 (×4): 2 via RESPIRATORY_TRACT
  Filled 2017-03-20: qty 8.8

## 2017-03-20 NOTE — Progress Notes (Signed)
   Progress Note  Patient Name: Renita PapaWendell B Mccort Date of Encounter: 03/20/2017  Primary Cardiologist: Georga HackingW Spencer Tilley, MD   Subjective   Currently feeling well without major complaint  Inpatient Medications    Scheduled Meds: . dofetilide  500 mcg Oral BID  . magnesium oxide  400 mg Oral Daily  . metoprolol tartrate  75 mg Oral BID  . rivaroxaban  20 mg Oral Daily  . sodium chloride flush  3 mL Intravenous Q12H   Continuous Infusions: . sodium chloride     PRN Meds: sodium chloride, acetaminophen, guaiFENesin-dextromethorphan, ondansetron (ZOFRAN) IV, pantoprazole, sodium chloride flush   Vital Signs    Vitals:   03/19/17 2102 03/20/17 0502 03/20/17 0826 03/20/17 0828  BP: (!) 120/94 123/76  105/75  Pulse: (!) 109  (!) 128   Resp: 18 19    Temp: 98 F (36.7 C) 97.9 F (36.6 C)    TempSrc: Oral Oral    SpO2: 96% 99%    Weight:  209 lb 12.8 oz (95.2 kg)    Height:        Intake/Output Summary (Last 24 hours) at 03/20/2017 1131 Last data filed at 03/19/2017 1700 Gross per 24 hour  Intake 240 ml  Output -  Net 240 ml   Filed Weights   03/19/17 1517 03/20/17 0502  Weight: 215 lb (97.5 kg) 209 lb 12.8 oz (95.2 kg)    Telemetry    Atrial flutter- Personally Reviewed  ECG    Atrial flutter- Personally Reviewed  Physical Exam   GEN: No acute distress.   Neck: No JVD Cardiac:  Tachycardic, regular, no murmurs, rubs, or gallops.  Respiratory: Clear to auscultation bilaterally. GI: Soft, nontender, non-distended  MS: No edema; No deformity. Neuro:  Nonfocal  Psych: Normal affect   Labs    Chemistry Recent Labs  Lab 03/19/17 1045 03/20/17 0402  NA 138 139  K 4.1 3.7  CL 103 99*  CO2 25 27  GLUCOSE 95 103*  BUN 6 13  CREATININE 0.93 1.13  CALCIUM 8.7* 9.1  GFRNONAA >60 >60  GFRAA >60 >60  ANIONGAP 10 13     HematologyNo results for input(s): WBC, RBC, HGB, HCT, MCV, MCH, MCHC, RDW, PLT in the last 168 hours.  Cardiac EnzymesNo results  for input(s): TROPONINI in the last 168 hours. No results for input(s): TROPIPOC in the last 168 hours.   BNPNo results for input(s): BNP, PROBNP in the last 168 hours.   DDimer No results for input(s): DDIMER in the last 168 hours.   Radiology    No results found.  Cardiac Studies   LVEF of 55% documented via echocardiogram on 12/01/2016  Patient Profile     57 y.o. male with a history of long-standing persistent atrial fibrillation the hospital for  dofetilide loading  Assessment & Plan    1.  Long-standing persistent atrial fibrillation: Started dofetilide yesterday.  Tolerating 5 g.  Is currently in atrial flutter.  We Sydnei Ohaver plan for cardioversion tomorrow if he does not convert to normal rhythm.  We Khole Arterburn plan to replete magnesium and potassium today.  2. OSA: Encouraged CPAP compliance  3.  Obesity:  Weight loss  For questions or updates, please contact [facility] Please consult [email or web address] for contact info under Cardiology/STEMI.      Signed, Tenleigh Byer Jorja LoaMartin Kipling Graser, MD  03/20/2017, 11:31 AM

## 2017-03-20 NOTE — Progress Notes (Signed)
Qtc post 3rd dose of Tikosyn 471. Pt remains in NSR. VS stable. No complaints at this time. Dierdre HighmanHall, Muneer Leider Marie, RN

## 2017-03-20 NOTE — Progress Notes (Signed)
Tikosyn administered per orders. 3 hour post EKG reading A Flutter/ACUTE MI. Pt is with out complaints of CP or any other complaint. VS stable. Pt also had 4 bts NSVT at 2130. Dr Mayford Knifeurner on call and notified/reviewed EKG. No new orders at this. Dierdre HighmanHall, Thurmond Hildebran Marie, RN

## 2017-03-20 NOTE — Care Management Note (Signed)
Case Management Note  Patient Details  Name: Renita PapaWendell B Daniely MRN: [id number] Date of Birth: 12/10/1960  Subjective/Objective: Pt presented for Tikosyn Load! PTA Independent from home. Pt will need a Rx for 7 day supply Tikosyn no refill and the original Rx with refills. CM will assist with the 7 day supply no refills via Main Pharmacy.                    Action/Plan: [facility] [address] and the pharmacy can [employer] [facility]. Pt has Medicaid and Tikosyn will need prior authorization. Please call # [phone]. Co- pay will be $3.00. No further needs from CM at this time.    Expected Discharge Date:                  Expected Discharge Plan:  Home/Self Care  In-House Referral:  NA  Discharge planning Services  CM Consult, Medication Assistance  Post Acute Care Choice:  NA Choice offered to:  NA  DME Arranged:  N/A DME Agency:  NA  HH Arranged:  NA HH Agency:  NA  Status of Service:  Completed, signed off  If discussed at Long Length of Stay Meetings, dates discussed:    Additional Comments:  Gala LewandowskyGraves-Bigelow, Aaryn Parrilla Kaye, RN 03/20/2017, 11:33 AM

## 2017-03-20 NOTE — Progress Notes (Signed)
Received 2nd dose of Tikosyn in AM. Converted to Sinus rhythm. EKG done, VSS. QTc stable. Updated NP Glory BuffSeiler. Will continue to monitor.

## 2017-03-21 ENCOUNTER — Other Ambulatory Visit: Payer: Self-pay

## 2017-03-21 ENCOUNTER — Encounter (HOSPITAL_COMMUNITY)
Admission: RE | Disposition: A | Discharge status: Home / Self Care | Payer: Self-pay | Source: Ambulatory Visit | Attending: Internal Medicine

## 2017-03-21 LAB — BASIC METABOLIC PANEL
Anion gap: 11 (ref 5–15)
BUN: 13 mg/dL (ref 6–20)
CALCIUM: 8.9 mg/dL (ref 8.9–10.3)
CO2: 26 mmol/L (ref 22–32)
Chloride: 100 mmol/L — ABNORMAL LOW (ref 101–111)
Creatinine, Ser: 1.23 mg/dL (ref 0.61–1.24)
GFR calc Af Amer: >60 mL/min (ref >60)
GLUCOSE: 101 mg/dL — AB (ref 65–99)
POTASSIUM: 4.3 mmol/L (ref 3.5–5.1)
SODIUM: 137 mmol/L (ref 135–145)

## 2017-03-21 LAB — MAGNESIUM: MAGNESIUM: 1.9 mg/dL (ref 1.7–2.4)

## 2017-03-21 SURGERY — CARDIOVERSION
Anesthesia: General

## 2017-03-21 NOTE — Progress Notes (Signed)
   Electrophysiology Rounding Note  Patient Name: [name] [name] Date of Encounter: 03/21/2017  Primary Cardiologist: Donnie Ahoilley Electrophysiologist: Elberta Fortisamnitz   Subjective   The patient is doing well today.  At this time, the patient denies chest pain, shortness of breath, or any new concerns.  Inpatient Medications    Scheduled Meds: . dofetilide  500 mcg Oral BID  . magnesium oxide  400 mg Oral Daily  . metoprolol tartrate  75 mg Oral BID  . mometasone-formoterol  2 puff Inhalation BID  . rivaroxaban  20 mg Oral Daily  . sodium chloride flush  3 mL Intravenous Q12H   Continuous Infusions: . sodium chloride     PRN Meds: sodium chloride, acetaminophen, guaiFENesin-dextromethorphan, ondansetron (ZOFRAN) IV, pantoprazole, sodium chloride flush   Vital Signs    Vitals:   03/20/17 0826 03/20/17 0828 03/20/17 1406 03/20/17 2035  BP:  105/75 107/72 127/64  Pulse: (!) 128   92  Resp:    18  Temp:   97.6 F (36.4 C) 98.2 F (36.8 C)  TempSrc:   Oral Oral  SpO2:   92% 94%  Weight:      Height:        Intake/Output Summary (Last 24 hours) at 03/21/2017 0620 Last data filed at 03/20/2017 0850 Gross per 24 hour  Intake 240 ml  Output -  Net 240 ml   Filed Weights   03/19/17 1517 03/20/17 0502  Weight: 215 lb (97.5 kg) 209 lb 12.8 oz (95.2 kg)    Physical Exam    GEN- The patient is well appearing, alert and oriented x 3 today.   Head- normocephalic, atraumatic Eyes-  Sclera clear, conjunctiva pink Ears- hearing intact Oropharynx- clear Neck- supple Lungs- Clear to ausculation bilaterally, normal work of breathing Heart- Regular rate and rhythm  GI- soft, NT, ND, + BS Extremities- no clubbing, cyanosis, or edema Skin- no rash or lesion Psych- euthymic mood, full affect Neuro- strength and sensation are intact  Labs    CBC No results for input(s): WBC, NEUTROABS, HGB, HCT, MCV, PLT in the last 72 hours. Basic Metabolic Panel Recent Labs    13/09/6500/12/19 0402  03/21/17 0326  NA 139 137  K 3.7 4.3  CL 99* 100*  CO2 27 26  GLUCOSE 103* 101*  BUN 13 13  CREATININE 1.13 1.23  CALCIUM 9.1 8.9  MG 1.8 1.9    Telemetry    Flutter -> SR (personally reviewed)  Radiology    No results found.   Patient Profile     [name] [name] is a 57 y.o. male admitted for Tikosyn load  Assessment & Plan    1.  Persistent atrial fibrillation/flutter Converted to SR on Tikosyn Labs, QTc stable Continue OAC for CHADS2VASC of 1  2.  HTN Stable No change required today  Plan discharge tomorrow if QT remains stable  Signed, Gypsy BalsamAmber Seiler, NP  03/21/2017, 6:20 AM   I have seen and examined this patient with Gypsy BalsamAmber Seiler.  Agree with above, note added to reflect my findings.  On exam, RRR, no murmurs, lungs clear. Converted to sinus rhythm after 3rd dose of tikosyn. Plan for 2 more doses. Plan for likely discharge tomorrow AM.    Runa Whittingham M. Ric Rosenberg MD 03/21/2017 7:42 AM

## 2017-03-21 NOTE — Discharge Summary (Signed)
ELECTROPHYSIOLOGY PROCEDURE DISCHARGE SUMMARY    Patient ID: [name] [name],  MRN: [id number], DOB/AGE: 55/10/1960 55 y.o.  Admit date: 03/19/2017 Discharge date: 03/22/17  Primary Care Physician: Thomasene Lotpalski, Deborah, DO  Primary Cardiologist: Dr. Donnie Ahoilley Electrophysiologist: Dr. Elberta Fortisamnitz  Primary Discharge Diagnosis:  1.  Persistent  atrial fibrillation status post Tikosyn loading this admission      CHA2DS2Vasc is 1, on Xarelto  Secondary Discharge Diagnosis:  1. COPD 2. HTN 3. Obesity  No Known Allergies   Procedures This Admission:  1.  Tikosyn loading   Brief HPI: [name] [name] is a 57 y.o. male with a past medical history as noted above.  He was referred to the AFib clinic to assiat in arrangements for Tikosyn initiation.  Risks, benefits, and alternatives to Tikosyn were reviewed with the patient who wished to proceed.    Hospital Course:  The patient was admitted and Tikosyn was initiated.  Renal function and electrolytes were followed during the hospitalization.  His QTc remained stable.  The patient converted with drug and did not require DCCV.   He was monitored until discharge on telemetry which demonstrated SB/SR 60's day/50's night.  On the day of discharge, the patient was examined by Dr Gerre Pebblesamitz who considered him stable for discharge to home.  Follow-up has been arranged with the AFib clinic in 1 week and with Dr Elberta Fortisamnitz in 4 weeks.   prior authorization was requested for Dofetilide 500mcg PO BID  # [phone] Spoke w/Katie Prior Auth Ref # [id number] Final response may take up to 24 hours   Physical Exam: Vitals:   03/21/17 2100 03/21/17 2113 03/22/17 0639 03/22/17 1004  BP: 130/79 127/68 118/64   Pulse:  (!) 53 71   Resp:  16    Temp:  97.9 F (36.6 C) 97.8 F (36.6 C)   TempSrc:  Oral Oral   SpO2:  95% 94% 94%  Weight:   215 lb 4.8 oz (97.7 kg)   Height:        GEN- The patient is well appearing, alert and oriented x 3 today.     HEENT: normocephalic, atraumatic; sclera clear, conjunctiva pink; hearing intact; oropharynx clear; neck supple, no JVP Lymph- no cervical lymphadenopathy Lungs- CTA b/l, normal work of breathing.  No wheezes, rales, rhonchi Heart- RRR, no murmurs, rubs or gallops, PMI not laterally displaced GI- soft, non-tender, non-distended Extremities- no clubbing, cyanosis, or edema MS- no significant deformity or atrophy Skin- warm and dry, no rash or lesion Psych- euthymic mood, full affect Neuro- strength and sensation are intact   Labs:  No results found for: WBC, HGB, HCT, MCV, PLT  Recent Labs  Lab 03/22/17 0504  NA 139  K 4.5  CL 100*  CO2 28  BUN 11  CREATININE 1.10  CALCIUM 8.8*  GLUCOSE 92     Discharge Medications:  Allergies as of 03/22/2017   No Known Allergies     Medication List    TAKE these medications   BEVESPI AEROSPHERE 9-4.8 MCG/ACT Aero Generic drug:  Glycopyrrolate-Formoterol Inhale 1 puff daily into the lungs.   chlorpheniramine 4 MG tablet Commonly known as:  CHLOR-TRIMETON Take 4 mg by mouth 2 (two) times daily as needed for allergies.   dofetilide 500 MCG capsule Commonly known as:  TIKOSYN Take 1 capsule (500 mcg total) by mouth 2 (two) times daily.   magnesium oxide 400 (241.3 Mg) MG tablet Commonly known as:  MAG-OX Take 1 tablet (400 mg total) by  mouth daily. Start taking on:  03/23/2017   metoprolol tartrate 50 MG tablet Commonly known as:  LOPRESSOR Take 1.5 tablets (75 mg total) by mouth 2 (two) times daily.   pantoprazole 40 MG tablet Commonly known as:  PROTONIX Take 40 mg daily as needed by mouth. For acid reflux/indigestion   XARELTO 20 MG Tabs tablet Generic drug:  rivaroxaban Take 20 mg daily by mouth.       Disposition:  Home Discharge Instructions    Diet - low sodium heart healthy   Complete by:  As directed    Increase activity slowly   Complete by:  As directed      Follow-up Information    [facility] Follow up on 03/29/2017.   Specialty:  Cardiology Why:  3:00PM Contact information: [address] [id number] mc [address] [place] [address] [phone]       Regan Lemming, MD Follow up on 04/23/2017.   Specialty:  Cardiology Why:  8:45AM Contact information: [address] [place] [address] [phone]           Duration of Discharge Encounter: Greater than 30 minutes including physician time.  Signed, Francis Dowse, PA-C 03/22/2017 12:30 PM  I have seen and examined this patient with Francis Dowse.  Agree with above, note added to reflect my findings.  On exam, RRR, no murmurs, lungs clear.  The hospital for dofetilide load.  Converted to sinus rhythm after his third dose.  We Elisabet Gutzmer plan to discharge today with follow-up in A. fib clinic.  Unfortunately, insurance has denied paying for the medication.  Plan to work through a prior authorization to get the medication for the patient.  Dalia Jollie M. Saddie Sandeen MD 03/22/2017 3:18 PM

## 2017-03-21 NOTE — Plan of Care (Deleted)
  Cardiac: Ability to achieve and maintain adequate cardiopulmonary perfusion will improve 03/21/2017 2328 - Not Progressing by Viviano SimasNewell, Jahdiel Krol N, RN Note Pt converted back to A.Fib during the day 2/13. Pt's cardizem drip has been transitioned to PO cardizem. HR is controlled at the moment. Will continue to monitor and continue current plan of care as ordered.

## 2017-03-21 NOTE — Progress Notes (Signed)
Patient's QTc per EKG at 1100 506. Spoke with Gypsy BalsamAmber Seiler NP; received orders to repeat EKG at 1600. 1600 EKG showed QTc of 441. Paged Gypsy BalsamAmber Seiler NP with this information; no new orders received at this time.

## 2017-03-21 NOTE — Discharge Instructions (Addendum)
Please go to the Atrial Fibrillation Clinic tomorrow, 03/23/17, and bring papers for insurance as discussed on day of discharge.  You have an appointment set up with the Atrial Fibrillation Clinic.  Multiple studies have shown that being followed by a dedicated atrial fibrillation clinic in addition to the standard care you receive from your other physicians improves health. We believe that enrollment in the atrial fibrillation clinic will allow [name] [name] to better care for you.   The phone number to the Atrial Fibrillation Clinic is [phone]. The clinic is staffed Monday through Friday from 8:30am to 5pm.  Parking Directions: The clinic is located in the Heart and Vascular Building connected to [facility]. 1)From [address] turn on to [address] and go to the 3rd entrance  (Heart and Vascular entrance) on the right. 2)Look to the right for Heart &Vascular Parking Garage. 3)A code for the entrance is required please call the clinic to receive this.   4)Take the elevators to the 1st floor. Registration is in the room with the glass walls at the end of the hallway.  If you have any trouble parking or locating the clinic, please dont hesitate to call [phone].  Information on my medicine - XARELTO (Rivaroxaban)  Why was Xarelto prescribed for you? Xarelto was prescribed for you to reduce the risk of a blood clot forming that can cause a stroke if you have a medical condition called atrial fibrillation (a type of irregular heartbeat).  What do you need to know about xarelto ? Take your Xarelto ONCE DAILY at the same time every day with your evening meal. If you have difficulty swallowing the tablet whole, you may crush it and mix in applesauce just prior to taking your dose.  Take Xarelto exactly as prescribed by your doctor and DO NOT stop taking Xarelto without talking to the doctor who prescribed the medication.  Stopping without other stroke prevention medication to  take the place of Xarelto may increase your risk of developing a clot that causes a stroke.  Refill your prescription before you run out.  After discharge, you should have regular check-up appointments with your healthcare provider that is prescribing your Xarelto.  In the future your dose may need to be changed if your kidney function or weight changes by a significant amount.  What do you do if you miss a dose? If you are taking Xarelto ONCE DAILY and you miss a dose, take it as soon as you remember on the same day then continue your regularly scheduled once daily regimen the next day. Do not take two doses of Xarelto at the same time or on the same day.   Important Safety Information A possible side effect of Xarelto is bleeding. You should call your healthcare provider right away if you experience any of the following: ? Bleeding from an injury or your nose that does not stop. ? Unusual colored urine (red or dark brown) or unusual colored stools (red or black). ? Unusual bruising for unknown reasons. ? A serious fall or if you hit your head (even if there is no bleeding).  Some medicines may interact with Xarelto and might increase your risk of bleeding while on Xarelto. To help avoid this, consult your healthcare provider or pharmacist prior to using any new prescription or non-prescription medications, including herbals, vitamins, non-steroidal anti-inflammatory drugs (NSAIDs) and supplements.  This website has more information on Xarelto: www.xarelto.com

## 2017-03-22 ENCOUNTER — Encounter (HOSPITAL_COMMUNITY): Payer: Self-pay | Admitting: *Deleted

## 2017-03-22 ENCOUNTER — Encounter: Payer: Self-pay | Admitting: Cardiology

## 2017-03-22 ENCOUNTER — Other Ambulatory Visit: Payer: Self-pay

## 2017-03-22 ENCOUNTER — Telehealth: Payer: Self-pay | Admitting: Physician Assistant

## 2017-03-22 DIAGNOSIS — I119 Hypertensive heart disease without heart failure: Secondary | ICD-10-CM | POA: Insufficient documentation

## 2017-03-22 LAB — BASIC METABOLIC PANEL
Anion gap: 11 (ref 5–15)
BUN: 11 mg/dL (ref 6–20)
CHLORIDE: 100 mmol/L — AB (ref 101–111)
CO2: 28 mmol/L (ref 22–32)
CREATININE: 1.1 mg/dL (ref 0.61–1.24)
Calcium: 8.8 mg/dL — ABNORMAL LOW (ref 8.9–10.3)
GFR calc non Af Amer: >60 mL/min (ref >60)
GLUCOSE: 92 mg/dL (ref 65–99)
Potassium: 4.5 mmol/L (ref 3.5–5.1)
Sodium: 139 mmol/L (ref 135–145)

## 2017-03-22 LAB — MAGNESIUM: Magnesium: 1.9 mg/dL (ref 1.7–2.4)

## 2017-03-22 MED ORDER — MAGNESIUM OXIDE 400 (241.3 MG) MG PO TABS: 400.0000 mg | ORAL_TABLET | Freq: Every day | ORAL | 6 refills | Status: DC

## 2017-03-22 MED ORDER — DOFETILIDE 500 MCG PO CAPS: 500.0000 ug | ORAL_CAPSULE | Freq: Two times a day (BID) | ORAL | 6 refills | Status: DC

## 2017-03-22 NOTE — Progress Notes (Signed)
Patient was denied coverage for Dofetilide.   Amiodarone is a preferred drug as well apparently, (beyond those initially reported and listed in Dr. Gershon Craneamnitz's note, explaining why the preferred drugs are in appropriate and others given he had failed Flecainide)   In my prior-auth request I reported amiodarone was not a reasonable medication for the patient given chronic lung disease.  His young age is also a reason not to prescribe amiodarone.  Despite this he was denied.    This can be appealed, though needs to be initiated by the patient, am told he [name] receive information from his insurance company on how to go about this directly to him.  I spoke with the Afib clinic, recommend that he apply for financial assistance, given he is on medicaid unlikely that he [name] not be approved.  I spoke with the patient and informed him.    He has a week supply of drug leaving today, he Jammi Morrissette fill out the paperwork for financial assistance and bring it and the financial information need to the AFib clinic tomorrow, they Sinda Leedom assist him with completing this process and Latoyia Tecson be able to help with further samples to cover him.  The patient understands need to start this as soon as possible, to go to the AFib clinic tomorrow.  Francis Dowseenee Ursuy, PA-C  Loman BrooklynWill Chea Malan, MD

## 2017-03-22 NOTE — Telephone Encounter (Signed)
Re-called prior-authorization to re-request authorization for Dofetilide 500mcg BID 1- [phone] [id number] Interaction number w/Gene #[id number] Pending prior auth can take up to 24 hours Number to reference  is #[id number]  Francis Dowseenee Avree Szczygiel, PA-C

## 2017-03-26 ENCOUNTER — Telehealth: Payer: Self-pay | Admitting: Physician Assistant

## 2017-03-26 NOTE — Telephone Encounter (Signed)
Called to f/u 1- [phone] Spoke with Kenard Gowerrew Approved for Dofetilide 500mcg BID VH#[id number]#[id number]  Made patient aware.  Francis Dowseenee Ursuy, PA-C

## 2017-03-29 ENCOUNTER — Ambulatory Visit (HOSPITAL_COMMUNITY)
Admission: RE | Admit: 2017-03-29 | Discharge: 2017-03-29 | Disposition: A | Discharge status: Home / Self Care | Payer: Medicaid Other | Source: Ambulatory Visit | Attending: Nurse Practitioner | Admitting: Nurse Practitioner

## 2017-03-29 ENCOUNTER — Encounter (HOSPITAL_COMMUNITY): Payer: Self-pay | Admitting: Nurse Practitioner

## 2017-03-29 VITALS — BP 126/72 | HR 84 | Ht 70.0 in | Wt 221.4 lb

## 2017-03-29 DIAGNOSIS — Z6831 Body mass index (BMI) 31.0-31.9, adult: Secondary | ICD-10-CM | POA: Diagnosis not present

## 2017-03-29 DIAGNOSIS — Z87891 Personal history of nicotine dependence: Secondary | ICD-10-CM | POA: Insufficient documentation

## 2017-03-29 DIAGNOSIS — Z79899 Other long term (current) drug therapy: Secondary | ICD-10-CM | POA: Diagnosis not present

## 2017-03-29 DIAGNOSIS — E669 Obesity, unspecified: Secondary | ICD-10-CM | POA: Insufficient documentation

## 2017-03-29 DIAGNOSIS — I4811 Longstanding persistent atrial fibrillation: Secondary | ICD-10-CM

## 2017-03-29 DIAGNOSIS — Z8249 Family history of ischemic heart disease and other diseases of the circulatory system: Secondary | ICD-10-CM | POA: Insufficient documentation

## 2017-03-29 DIAGNOSIS — Z7902 Long term (current) use of antithrombotics/antiplatelets: Secondary | ICD-10-CM | POA: Insufficient documentation

## 2017-03-29 DIAGNOSIS — I10 Essential (primary) hypertension: Secondary | ICD-10-CM | POA: Insufficient documentation

## 2017-03-29 DIAGNOSIS — J449 Chronic obstructive pulmonary disease, unspecified: Secondary | ICD-10-CM | POA: Diagnosis not present

## 2017-03-29 DIAGNOSIS — Z9889 Other specified postprocedural states: Secondary | ICD-10-CM | POA: Insufficient documentation

## 2017-03-29 DIAGNOSIS — I481 Persistent atrial fibrillation: Secondary | ICD-10-CM

## 2017-03-29 DIAGNOSIS — Z836 Family history of other diseases of the respiratory system: Secondary | ICD-10-CM | POA: Diagnosis not present

## 2017-03-29 LAB — BASIC METABOLIC PANEL
Anion gap: 8 (ref 5–15)
BUN: 11 mg/dL (ref 6–20)
CALCIUM: 9.1 mg/dL (ref 8.9–10.3)
CHLORIDE: 103 mmol/L (ref 101–111)
CO2: 28 mmol/L (ref 22–32)
CREATININE: 1 mg/dL (ref 0.61–1.24)
GFR calc Af Amer: >60 mL/min (ref >60)
GFR calc non Af Amer: >60 mL/min (ref >60)
GLUCOSE: 135 mg/dL — AB (ref 65–99)
Potassium: 4.4 mmol/L (ref 3.5–5.1)
Sodium: 139 mmol/L (ref 135–145)

## 2017-03-29 LAB — MAGNESIUM: Magnesium: 1.9 mg/dL (ref 1.7–2.4)

## 2017-03-29 NOTE — Progress Notes (Signed)
Primary Care Physician: Thomasene Lotpalski, Deborah, DO Referring Physician: Dr. Azalia Bilisamnitz   [name] [name] is a 57 y.o. male with a h/o persistent afib of questionable duration but possibly 2-3 years. He recently failed cardioversion. He is in the afib clinic to come in for Tikosyn. He is aware of the cost of drug, but shows some concern regarding how specific he will have to be taking the drug. No missed doses of xarelto, has not used any benadryl.  F/u in afib clinic, 2/21. He is s/p Tikosyn load x one week. He felt improved until this am, he felt he could not catch his breath for about one hour.  He did not think to take his pulse . He did drink alcohol last pm and his Tikosyn was delayed x one hour this am. He is feeling back to baseline now. EKG shows SR. He is still drinking 6-8 beers a night and we did discuss that excessive alcohol can increase afib burden, as well as make him at risk for GI bleeds.    Today, he denies symptoms of palpitations, chest pain, shortness of breath, orthopnea, PND, lower extremity edema, dizziness, presyncope, syncope, or neurologic sequela. The patient is tolerating medications without difficulties and is otherwise without complaint today.   Past Medical History:  Diagnosis Date  . COPD (chronic obstructive pulmonary disease) (HCC)   . Hypertension   . Obesity (BMI 30-39.9) 12/15/2016  . Persistent atrial fibrillation (HCC) 12/15/2016   Past Surgical History:  Procedure Laterality Date  . CARDIOVERSION N/A 12/22/2016   Procedure: CARDIOVERSION;  Surgeon: Othella Boyerilley, William S, MD;  [facility];  Service: Cardiovascular;  Laterality: N/A;  . HEMORROIDECTOMY    . HERNIA REPAIR      Current Outpatient Medications  Medication Sig Dispense Refill  . dofetilide (TIKOSYN) 500 MCG capsule Take 1 capsule (500 mcg total) by mouth 2 (two) times daily. 60 capsule 6  . Glycopyrrolate-Formoterol (BEVESPI AEROSPHERE) 9-4.8 MCG/ACT AERO Inhale 1 puff daily into the  lungs.    . magnesium oxide (MAG-OX) 400 (241.3 Mg) MG tablet Take 1 tablet (400 mg total) by mouth daily. 30 tablet 6  . metoprolol tartrate (LOPRESSOR) 50 MG tablet Take 1.5 tablets (75 mg total) by mouth 2 (two) times daily. 90 tablet 3  . pantoprazole (PROTONIX) 40 MG tablet Take 40 mg daily as needed by mouth. For acid reflux/indigestion  3  . XARELTO 20 MG TABS tablet Take 20 mg daily by mouth.  12   No current facility-administered medications for this encounter.     No Known Allergies  Social History   Socioeconomic History  . Marital status: Divorced    Spouse name: Not on file  . Number of children: Not on file  . Years of education: Not on file  . Highest education level: Not on file  Social Needs  . Financial resource strain: Not on file  . Food insecurity - worry: Not on file  . Food insecurity - inability: Not on file  . Transportation needs - medical: Not on file  . Transportation needs - non-medical: Not on file  Occupational History  . Not on file  Tobacco Use  . Smoking status: Former Smoker    Packs/day: 1.00    Years: 30.00    Pack years: 30.00  . Smokeless tobacco: Never Used  Substance and Sexual Activity  . Alcohol use: Yes    Alcohol/week: 2.4 oz    Types: 4 Cans of beer per week  .  Drug use: No  . Sexual activity: Not on file  Other Topics Concern  . Not on file  Social History Narrative  . Not on file    Family History  Problem Relation Age of Onset  . COPD Mother   . COPD Father   . Atrial fibrillation Brother   . CAD Brother     ROS- All systems are reviewed and negative except as per the HPI above  Physical Exam: Vitals:   03/29/17 1523  BP: 126/72  Pulse: 84  Weight: 221 lb 6.4 oz (100.4 kg)  Height: 5\' 10"  (1.778 m)   Wt Readings from Last 3 Encounters:  03/29/17 221 lb 6.4 oz (100.4 kg)  03/22/17 215 lb 4.8 oz (97.7 kg)  03/19/17 215 lb (97.5 kg)    Labs: Lab Results  Component Value Date   NA 139 03/22/2017   K  4.5 03/22/2017   CL 100 (L) 03/22/2017   CO2 28 03/22/2017   GLUCOSE 92 03/22/2017   BUN 11 03/22/2017   CREATININE 1.10 03/22/2017   CALCIUM 8.8 (L) 03/22/2017   MG 1.9 03/22/2017   No results found for: INR No results found for: CHOL, HDL, LDLCALC, TRIG   GEN- The patient is well appearing, alert and oriented x 3 today.   Head- normocephalic, atraumatic Eyes-  Sclera clear, conjunctiva pink Ears- hearing intact Oropharynx- clear Neck- supple, no JVP Lymph- no cervical lymphadenopathy Lungs- Clear to ausculation bilaterally, normal work of breathing Heart- regular rate and rhythm, no murmurs, rubs or gallops, PMI not laterally displaced GI- soft, NT, ND, + BS Extremities- no clubbing, cyanosis, or edema MS- no significant deformity or atrophy Skin- no rash or lesion Psych- euthymic mood, full affect Neuro- strength and sensation are intact  EKG- NSR at 84 bpm, qrs int 90 ms, qtc 479 ms, st /t wave abnormality, unchanged from previous EKG Epic records reviewed    Assessment and Plan: 1. Persistent  afib   s/p one week tikosyn load Precautions re use of Tikosyn reviewed, especially to take on time No benadryl use Continue xarelto 20 mg daily for a chadvasc score of at least 1(htn) Discussed role of alcohol as to how it  promotes afib burden, and I would really like him to reduce alcohol intake by half now and eventually to no more than 2 a week Bmet/magtoday  F/u with Dr. Elberta Fortis 3/18 Afib clinic as needed   Lupita Leash C. [facility] [address] [place], [place] [address] [phone]

## 2017-04-23 ENCOUNTER — Ambulatory Visit: Payer: Medicaid Other | Admitting: Cardiology

## 2017-05-08 ENCOUNTER — Ambulatory Visit: Payer: Medicaid Other | Admitting: Cardiology

## 2017-05-08 ENCOUNTER — Encounter: Payer: Self-pay | Admitting: Cardiology

## 2017-05-08 VITALS — BP 134/82 | HR 77 | Ht 69.0 in | Wt 225.8 lb

## 2017-05-08 DIAGNOSIS — I481 Persistent atrial fibrillation: Secondary | ICD-10-CM | POA: Diagnosis not present

## 2017-05-08 DIAGNOSIS — F1010 Alcohol abuse, uncomplicated: Secondary | ICD-10-CM

## 2017-05-08 DIAGNOSIS — I4819 Other persistent atrial fibrillation: Secondary | ICD-10-CM

## 2017-05-08 DIAGNOSIS — G4733 Obstructive sleep apnea (adult) (pediatric): Secondary | ICD-10-CM

## 2017-05-08 DIAGNOSIS — I209 Angina pectoris, unspecified: Secondary | ICD-10-CM

## 2017-05-08 NOTE — Patient Instructions (Addendum)
Medication Instructions: STOP Xarelto   Labwork: None ordered  Procedures/Testing: Your physician has requested that you have cardiac CT. Cardiac computed tomography (CT) is a painless test that uses an x-ray machine to take clear, detailed pictures of your heart. For further information please visit [email or web address]. Please follow instructions below.  Follow-Up: You have been referred to Dr. Mayford Knifeurner for your sleep apnea.  Your physician recommends that you schedule a follow-up appointment in: 3 months with Dr. Elberta Fortisamnitz.    If you need a refill on your cardiac medications before your next appointment, please call your pharmacy.      CT INSTRUCTIONS [facility] at _____ AM (30-45 minutes prior to test start time)  [facility] [address] [place], [place] [address] [phone]  Proceed to the [facility] Radiology Department (First Floor).  Please follow these instructions carefully (unless otherwise directed):  Hold all erectile dysfunction medications at least 48 hours prior to test.  On the Night Before the Test: . Drink plenty of water. . Do not consume any caffeinated/decaffeinated beverages or chocolate 12 hours prior to your test. . Do not take any antihistamines 12 hours prior to your test. . If you take Metformin do not take 24 hours prior to test. . If the patient has contrast allergy: ? Patient will need a prescription for Prednisone and very clear instructions (as follows): 1. Prednisone 50 mg - take 13 hours prior to test 2. Take another Prednisone 50 mg 7 hours prior to test 3. Take another Prednisone 50 mg 1 hour prior to test 4. Take Benadryl 50 mg 1 hour prior to test . Patient must complete all four doses of above prophylactic medications. . Patient will need a ride after test due to Benadryl.  On the Day of the Test: . Drink plenty of water. Do not drink any water within one  hour of the test. . Do not eat any food 4 hours prior to the test. . You may take your regular medications prior to the test. . IF NOT ON A BETA BLOCKER - Take 50 mg of lopressor (metoprolol) one hour before the test. . HOLD Furosemide morning of the test.  After the Test: . Drink plenty of water. . After receiving IV contrast, you may experience a mild flushed feeling. This is normal. . On occasion, you may experience a mild rash up to 24 hours after the test. This is not dangerous. If this occurs, you can take Benadryl 25 mg and increase your fluid intake. . If you experience trouble breathing, this can be serious. If it is severe call 911 IMMEDIATELY. If it is mild, please call our office. . If you take any of these medications: Glipizide/Metformin, Avandament, Glucavance, please do not take 48 hours after completing test.

## 2017-05-08 NOTE — Progress Notes (Signed)
Electrophysiology Office Note   Date:  05/08/2017   ID:  [name] [name], DOB 1960/07/26, MRN [id number]  PCP:  Thomasene Lot, DO  Cardiologist:  Donnie Aho Primary Electrophysiologist:  [name], MD    Chief Complaint  Patient presents with  . Follow-up    Persistent Afib     History of Present Illness: [name] [name] is a 57 y.o. male who is being seen today for the evaluation of atrial fibrillation at the request of [name] [name]. Presenting today for electrophysiology evaluation.  He has a history of GERD, COPD, obesity, and atrial fibrillation.  He had an attempted cardioversion on 12/22/16, but was not able to be converted to sinus rhythm.  After cardioversion attempts, the patient said that he had potentially been out of rhythm for 3 or 4 years.  He has mild left atrial enlargement with mild LVH and normal systolic function.  Today, denies symptoms of palpitations,  orthopnea, PND, lower extremity edema, claudication, dizziness, presyncope, syncope, bleeding, or neurologic sequela. The patient is tolerating medications without difficulties.  He has been having chest pain and shortness of breath.  His chest pain is worse when he exerts himself and better at rest.  It is associated with shortness of breath.  He also gets short of breath with rest.  He feels that he may have lung issues.  He has been a heavy smoker but quit at age 89.   Past Medical History:  Diagnosis Date  . COPD (chronic obstructive pulmonary disease) (HCC)   . Hypertension   . Obesity (BMI 30-39.9) 12/15/2016  . Persistent atrial fibrillation (HCC) 12/15/2016   Past Surgical History:  Procedure Laterality Date  . CARDIOVERSION N/A 12/22/2016   Procedure: CARDIOVERSION;  Surgeon: Othella Boyer, MD;  Location: [facility] ENDOSCOPY;  Service: Cardiovascular;  Laterality: N/A;  . HEMORROIDECTOMY    . HERNIA REPAIR       Current Outpatient Medications  Medication Sig Dispense Refill  . dofetilide  (TIKOSYN) 500 MCG capsule Take 1 capsule (500 mcg total) by mouth 2 (two) times daily. 60 capsule 6  . Glycopyrrolate-Formoterol (BEVESPI AEROSPHERE) 9-4.8 MCG/ACT AERO Inhale 1 puff daily into the lungs.    . magnesium oxide (MAG-OX) 400 (241.3 Mg) MG tablet Take 1 tablet (400 mg total) by mouth daily. 30 tablet 6  . metoprolol tartrate (LOPRESSOR) 50 MG tablet Take 1.5 tablets (75 mg total) by mouth 2 (two) times daily. 90 tablet 3  . pantoprazole (PROTONIX) 40 MG tablet Take 40 mg daily as needed by mouth. For acid reflux/indigestion  3   No current facility-administered medications for this visit.     Allergies:   Patient has no known allergies.   Social History:  The patient  reports that he has quit smoking. He has a 30.00 pack-year smoking history. He has never used smokeless tobacco. He reports that he drinks about 2.4 oz of alcohol per week. He reports that he does not use drugs.   Family History:  The patient's family history includes Atrial fibrillation in his brother; CAD in his brother; COPD in his father and mother.    ROS:  Please see the history of present illness.   Otherwise, review of systems is positive for shortness of breath, palpitations, abdominal pain, diarrhea, depression, anxiety, headaches.   All other systems are reviewed and negative.    PHYSICAL EXAM: VS:  BP 134/82   Pulse 77   Ht  (1.753 m)   Wt 225 lb  12.8 oz (102.4 kg)   SpO2 96%   BMI 33.34 kg/m  , BMI Body mass index is 33.34 kg/m. GEN: Well nourished, well developed, in no acute distress  HEENT: normal  Neck: no JVD, carotid bruits, or masses Cardiac: iRRR; no murmurs, rubs, or gallops,no edema  Respiratory:  clear to auscultation bilaterally, normal work of breathing GI: soft, nontender, nondistended, + BS MS: no deformity or atrophy  Skin: warm and dry Neuro:  Strength and sensation are intact Psych: euthymic mood, full affect  EKG:  EKG is ordered today. Personal review of the ekg  ordered shows atrial fibrillation, rate 141  Recent Labs: 03/29/2017: BUN 11; Creatinine, Ser 1.00; Magnesium 1.9; Potassium 4.4; Sodium 139    Lipid Panel  No results found for: CHOL, TRIG, HDL, CHOLHDL, VLDL, LDLCALC, LDLDIRECT   Wt Readings from Last 3 Encounters:  05/08/17 225 lb 12.8 oz (102.4 kg)  03/29/17 221 lb 6.4 oz (100.4 kg)  03/22/17 215 lb 4.8 oz (97.7 kg)      Other studies Reviewed: Additional studies/ records that were reviewed today include: outside cardiology notes   LVEF of 55% documented via echocardiogram on 12/01/2016   ASSESSMENT AND PLAN:  1.  Long-standing persistent atrial fibrillation: Previously admitted to the hospital and loaded on dofetilide.  Has a low stroke risk and thus not anticoagulated.  He is remained in sinus rhythm.  He is currently on Xarelto for anticoagulation.  Due to his low stroke risk, we Demetric Parslow plan to stop Xarelto today.  Weight loss encouraged  This patients CHA2DS2-VASc Score and unadjusted Ischemic Stroke Rate (% per year) is equal to 0.2 % stroke rate/year from a score of 0  Above score calculated as 1 point each if present [CHF, HTN, DM, Vascular=MI/PAD/Aortic Plaque, Age if 65-74, or Male] Above score calculated as 2 points each if present [Age > 75, or Stroke/TIA/TE]    2. OSA: Encouraged CPAP compliance.  Octavion Mollenkopf refer to sleep medicine.  3.  Obesity: Weight loss encouraged  4.  Chest pain: Could be cardiac in nature.  He has pain with exertion.  He would prefer not to have stress testing performed.  We Shawndell Schillaci thus order a coronary CT with FFR.  Should this be positive, he would likely benefit from left heart catheterization.  5.  Alcohol abuse: Encouraged cessation  Current medicines are reviewed at length with the patient today.   The patient does not have concerns regarding his medicines.  The following changes were made today: stop Xarelto  Labs/ tests ordered today include:  Orders Placed This Encounter    Procedures  . CT CORONARY MORPH W/CTA COR W/SCORE W/CA W/CM &/OR WO/CM  . CT CORONARY FRACTIONAL FLOW RESERVE DATA PREP  . CT CORONARY FRACTIONAL FLOW RESERVE FLUID ANALYSIS  . EKG 12-Lead     Disposition:   FU with Pelham Hennick 3 months  Signed, Birgit Nowling Jorja Loa, MD  05/08/2017 1:52 PM     [facility] [address] [place] [address] [phone] (office) [phone] (fax)

## 2017-05-14 ENCOUNTER — Telehealth: Payer: Self-pay | Admitting: *Deleted

## 2017-05-14 DIAGNOSIS — G4733 Obstructive sleep apnea (adult) (pediatric): Secondary | ICD-10-CM

## 2017-05-14 NOTE — Telephone Encounter (Signed)
-----   Message from Quintella Reichertraci R Turner, MD sent at 05/11/2017 10:45 PM EDT ----- Regarding: RE: Referral to Turner for OSA Yes please order CPAP titration  Traci ----- Message ----- From: Reesa ChewJones, Ausha Sieh G, CMA Sent: 05/11/2017   4:58 PM To: Quintella Reichertraci R Turner, MD Subject: FW: Referral to Turner for OSA                 Dr Mayford Knifeurner, This patient had a sleep study on 12/17 ordered by Dr Donnie Ahoilley and read by Dr Maple HudsonYoung which recommended a CPAP titration that was never completed. The patient wishes to be referred to you at this time, are you ok with scheduling?   Thanks ----- Message ----- From: Silas SacramentoPrice, Donna M Sent: 05/11/2017   1:49 PM To: Baird LyonsSherri L Price, RN, Reesa Cheworothea G Icyss Skog, CMA Subject: FW: Referral to Turner for OSA                 Hi Coralee Northina,  This pt is being referred to Dr Mayford Knifeurner for sleep apnea.  Not sure if he has had a sleep study.  In the past, Dr Mayford Knifeurner required all pts to have had a sleep study before scheduling with her.  Let me know if I can schedule an appt with her. ----- Message ----- From: Baird LyonsPrice, Sherri L, RN Sent: 05/10/2017   9:57 AM To: Baird LyonsSherri L Price, RN, [facility] Subject: Referral to Turner for OSA                     Please call patient and arrange referral to Dr. Mayford Knifeurner for sleep apnea.

## 2017-05-14 NOTE — Telephone Encounter (Signed)
Titration sent to precert 

## 2017-05-15 ENCOUNTER — Telehealth: Payer: Self-pay | Admitting: *Deleted

## 2017-05-15 NOTE — Telephone Encounter (Signed)
Staff message sent to Coralee Northina informing her patient has Medicaid. Per protocol he doesn't need PA for CPAP titration. Ok to schedule.

## 2017-05-17 ENCOUNTER — Encounter: Payer: Self-pay | Admitting: *Deleted

## 2017-05-17 NOTE — Telephone Encounter (Addendum)
Patient is scheduled for CPAP Titration on 06/07/17. Patient understands his tit[facility]. Patient understands he will receive a letter in a week or so detailing appointment, date, time, and location. Patient understands to call if he does not receive the letter  in a timely manner. Patient agrees with treatment and thanked me for call. .[name]

## 2017-05-29 ENCOUNTER — Ambulatory Visit: Payer: Medicaid Other | Admitting: Cardiology

## 2017-06-07 ENCOUNTER — Ambulatory Visit (HOSPITAL_BASED_OUTPATIENT_CLINIC_OR_DEPARTMENT_OTHER): Payer: Medicaid Other | Attending: Cardiology | Admitting: Cardiology

## 2017-06-07 DIAGNOSIS — G4733 Obstructive sleep apnea (adult) (pediatric): Secondary | ICD-10-CM

## 2017-06-17 NOTE — Procedures (Signed)
   Patient Name: [name] [name], [name] [name] Date: 06/07/2017 Gender: Male D.O.B: July 11, 1960 Age (years): 48 Referring Provider: Armanda Magic MD, ABSM Height (inches): 71 Interpreting Physician: Armanda Magic MD, ABSM Weight (lbs): 224 RPSGT: Cherylann Parr BMI: 31 MRN: [id number] Neck Size: 18.00  CLINICAL INFORMATION The patient is referred for a CPAP titration to treat sleep apnea.  SLEEP STUDY TECHNIQUE As per the AASM Manual for the Scoring of Sleep and Associated Events v2.3 (April 2016) with a hypopnea requiring 4% desaturations.  The channels recorded and monitored were frontal, central and occipital EEG, electrooculogram (EOG), submentalis EMG (chin), nasal and oral airflow, thoracic and abdominal wall motion, anterior tibialis EMG, snore microphone, electrocardiogram, and pulse oximetry. Continuous positive airway pressure (CPAP) was initiated at the beginning of the study and titrated to treat sleep-disordered breathing.  MEDICATIONS Medications self-administered by patient taken the night of the study : CARVEDILOL, XARELTO  TECHNICIAN COMMENTS Comments added by technician: PATIENT TOLERATED THE MASK GOOD Comments added by scorer: N/A  RESPIRATORY PARAMETERS Optimal PAP Pressure (cm):15  AHI at Optimal Pressure (/hr):4.7 Overall Minimal O2 (%):77.0  Supine % at Optimal Pressure (%):25 Minimal O2 at Optimal Pressure (%): 82.0   SLEEP ARCHITECTURE The study was initiated at 11:17:51 PM and ended at 5:18:59 AM.  Sleep onset time was 0.8 minutes and the sleep efficiency was 79.4%%. The total sleep time was 286.8 minutes.  The patient spent 9.4%% of the night in stage N1 sleep, 79.4%% in stage N2 sleep, 0.0%% in stage N3 and 11.16% in REM.Stage REM latency was 96.0 minutes  Wake after sleep onset was 73.5. Alpha intrusion was absent. Supine sleep was 26.15%.  CARDIAC DATA The 2 lead EKG demonstrated sinus rhythm. The mean heart rate was 79.6 beats per minute. Other EKG  findings include: None.  LEG MOVEMENT DATA The total Periodic Limb Movements of Sleep (PLMS) were 0. The PLMS index was 0.0. A PLMS index of <15 is considered normal in adults.  IMPRESSIONS - The optimal PAP pressure was 15 cm of water. - Central sleep apnea was not noted during this titration (CAI = 0.0/h). - Severe oxygen desaturations were observed during this titration (min O2 = 77.0%). - No snoring was audible during this study. - No cardiac abnormalities were observed during this study. - Clinically significant periodic limb movements were not noted during this study. Arousals associated with PLMs were rare.  DIAGNOSIS - Obstructive Sleep Apnea (327.23 [G47.33 ICD-10])  RECOMMENDATIONS - Trial of CPAP therapy on 15 cm H2O with a Large size Fisher&Paykel Full Face Mask Simplus mask and heated humidification and Oxygen at 2 L via CPAP device. - Avoid alcohol, sedatives and other CNS depressants that may worsen sleep apnea and disrupt normal sleep architecture. - Sleep hygiene should be reviewed to assess factors that may improve sleep quality. - Weight management and regular exercise should be initiated or continued. - Return to Sleep Center for re-evaluation after 10 weeks of therapy  [Electronically signed] 06/17/2017 02:18 PM  Armanda Magic MD, ABSM Diplomate, American Board of Sleep Medicine

## 2017-06-21 ENCOUNTER — Telehealth: Payer: Self-pay | Admitting: *Deleted

## 2017-06-21 NOTE — Telephone Encounter (Addendum)
Informed patient of sleep study results and patient understanding was verbalized. Patient understands her sleep study showed she had a successful PAP titration. Upon patient request DME selection is [facility] Patient understands he will be contacted by [facility] to set up her cpap. Patient understands to call if [facility] does not contact her with new setup in a timely manner. Patient understands they will be called once confirmation has been received from [facility] that they have received their new machine to schedule 10 week follow up appointment.  [facility] notified of new cpap order  Please add to airview Patient was grateful for the call and thanked me.

## 2017-06-21 NOTE — Telephone Encounter (Signed)
-----   Message from Quintella Reichert, MD sent at 06/17/2017  2:21 PM EDT ----- Please let patient know that they had a successful PAP titration and let DME know that orders are in EPIC.  Please set up 10 week OV with me. Please get an overnight pulse oximetry on CPAP with 2L O2

## 2017-06-25 ENCOUNTER — Ambulatory Visit (HOSPITAL_COMMUNITY): Payer: Medicaid Other

## 2017-06-25 ENCOUNTER — Other Ambulatory Visit: Payer: Self-pay | Admitting: Cardiology

## 2017-06-27 ENCOUNTER — Ambulatory Visit (HOSPITAL_COMMUNITY): Payer: Medicaid Other

## 2017-06-27 ENCOUNTER — Encounter (HOSPITAL_COMMUNITY): Payer: Self-pay

## 2017-06-27 ENCOUNTER — Ambulatory Visit (HOSPITAL_COMMUNITY)
Admission: RE | Admit: 2017-06-27 | Discharge: 2017-06-27 | Disposition: A | Discharge status: Home / Self Care | Payer: Medicaid Other | Source: Ambulatory Visit | Attending: Cardiology | Admitting: Cardiology

## 2017-06-27 DIAGNOSIS — I209 Angina pectoris, unspecified: Secondary | ICD-10-CM

## 2017-06-27 DIAGNOSIS — I481 Persistent atrial fibrillation: Secondary | ICD-10-CM | POA: Diagnosis present

## 2017-06-27 DIAGNOSIS — I4819 Other persistent atrial fibrillation: Secondary | ICD-10-CM

## 2017-06-27 DIAGNOSIS — R079 Chest pain, unspecified: Secondary | ICD-10-CM | POA: Diagnosis not present

## 2017-06-27 DIAGNOSIS — I700 Atherosclerosis of aorta: Secondary | ICD-10-CM | POA: Diagnosis not present

## 2017-06-27 DIAGNOSIS — K760 Fatty (change of) liver, not elsewhere classified: Secondary | ICD-10-CM | POA: Diagnosis not present

## 2017-06-27 MED ORDER — NITROGLYCERIN 0.4 MG SL SUBL
SUBLINGUAL_TABLET | SUBLINGUAL | Status: AC
  Filled 2017-06-27: qty 2

## 2017-06-27 MED ORDER — NITROGLYCERIN 0.4 MG SL SUBL
0.8000 mg | SUBLINGUAL_TABLET | Freq: Once | SUBLINGUAL | Status: AC
  Administered 2017-06-27: 0.8 mg via SUBLINGUAL

## 2017-06-27 MED ORDER — IOPAMIDOL (ISOVUE-370) INJECTION 76%
INTRAVENOUS | Status: AC
  Administered 2017-06-27: 80 mL
  Filled 2017-06-27: qty 100

## 2017-06-27 NOTE — Progress Notes (Signed)
Ct complete. Patient denies any complaints. Patient drinking coffee at this time.

## 2017-07-04 ENCOUNTER — Encounter: Payer: Self-pay | Admitting: Cardiology

## 2017-07-04 ENCOUNTER — Telehealth: Payer: Self-pay

## 2017-07-04 DIAGNOSIS — I2510 Atherosclerotic heart disease of native coronary artery without angina pectoris: Secondary | ICD-10-CM

## 2017-07-04 NOTE — Telephone Encounter (Signed)
Pt stated he is returning call to nurse.please call pt

## 2017-07-04 NOTE — Telephone Encounter (Signed)
This encounter was created in error - please disregard.

## 2017-07-04 NOTE — Telephone Encounter (Signed)
No obstructive CAD seen on CT scan. Does have coronary calcium. Needs fasting lipid panel...Marland KitchenMarland Kitchen Pt advised and will come in for fasting lipids Tues 07/10/17.

## 2017-07-10 ENCOUNTER — Other Ambulatory Visit: Payer: Medicaid Other | Admitting: *Deleted

## 2017-07-11 LAB — LIPID PANEL
Chol/HDL Ratio: 2.5 ratio (ref 0.0–5.0)
Cholesterol, Total: 150 mg/dL (ref 100–199)
HDL: 61 mg/dL (ref >39)
LDL Calculated: 81 mg/dL (ref 0–99)
Triglycerides: 40 mg/dL (ref 0–149)
VLDL Cholesterol Cal: 8 mg/dL (ref 5–40)

## 2017-07-13 ENCOUNTER — Telehealth: Payer: Self-pay

## 2017-07-13 DIAGNOSIS — E785 Hyperlipidemia, unspecified: Secondary | ICD-10-CM

## 2017-07-13 MED ORDER — ROSUVASTATIN CALCIUM 10 MG PO TABS: 10.0000 mg | ORAL_TABLET | Freq: Every day | ORAL | 3 refills | Status: DC

## 2017-07-13 NOTE — Telephone Encounter (Signed)
-----   Message from Will Jorja LoaMartin Camnitz, MD sent at 07/11/2017 11:12 AM EDT ----- Goal LDL 70. Start crestor 10 mg

## 2017-07-13 NOTE — Telephone Encounter (Signed)
Notes recorded by Phineas Semenobertson, Oliverio Cho, RN on 07/13/2017 at 3:50 PM EDT Pt made aware of lipid panel results. Informed pt Dr. Elberta Fortisamnitz recommends starting Crestor to lower LDL goal <70. He is in agreement with plan and thankful for the call. Pt scheduled for repeat FLP and ALT on 10/09/17.

## 2017-08-07 ENCOUNTER — Ambulatory Visit: Payer: Medicaid Other | Admitting: Cardiology

## 2017-08-07 ENCOUNTER — Encounter: Payer: Self-pay | Admitting: Cardiology

## 2017-08-07 ENCOUNTER — Encounter (INDEPENDENT_AMBULATORY_CARE_PROVIDER_SITE_OTHER): Payer: Self-pay

## 2017-08-07 VITALS — BP 134/78 | HR 66 | Ht 70.0 in | Wt 228.2 lb

## 2017-08-07 DIAGNOSIS — I4819 Other persistent atrial fibrillation: Secondary | ICD-10-CM

## 2017-08-07 DIAGNOSIS — Z79899 Other long term (current) drug therapy: Secondary | ICD-10-CM | POA: Diagnosis not present

## 2017-08-07 DIAGNOSIS — I481 Persistent atrial fibrillation: Secondary | ICD-10-CM | POA: Diagnosis not present

## 2017-08-07 NOTE — Progress Notes (Signed)
Electrophysiology Office Note   Date:  08/07/2017   ID:  [name] [name], DOB 11/25/1960, MRN [id number]  PCP:  [name] Davenport, MD  Cardiologist:  [name] [name] Primary Electrophysiologist:  [name] Postlewait Jorja Loa, MD    Chief Complaint  Patient presents with  . Follow-up    Persistent Afib     History of Present Illness: [name] [name] is a 57 y.o. male who is being seen today for the evaluation of atrial fibrillation at the request of [name]. Presenting today for electrophysiology evaluation.  He has a history of GERD, COPD, obesity, and atrial fibrillation.  He had an attempted cardioversion on 12/22/16, but was not able to be converted to sinus rhythm.  After cardioversion attempts, the patient said that he had potentially been out of rhythm for 3 or 4 years.  He has mild left atrial enlargement with mild LVH and normal systolic function.  He was admitted to the hospital for dofetilide loading.  He also had a sleep study and was found to have obstructive sleep apnea.  Today, denies symptoms of palpitations, chest pain, shortness of breath, orthopnea, PND, lower extremity edema, claudication, dizziness, presyncope, syncope, bleeding, or neurologic sequela. The patient is tolerating medications without difficulties.  Overall he is feeling well.  He does have some shortness of breath with exertion which he attributes to his 30 years of smoking.  He has not noted any further episodes of atrial fibrillation.  He is tolerating the dofetilide without issue.   Past Medical History:  Diagnosis Date  . COPD (chronic obstructive pulmonary disease) (HCC)   . Hypertension   . Obesity (BMI 30-39.9) 12/15/2016  . Persistent atrial fibrillation (HCC) 12/15/2016   Past Surgical History:  Procedure Laterality Date  . CARDIOVERSION N/A 12/22/2016   Procedure: CARDIOVERSION;  Surgeon: Othella Boyer, MD;  Location: [facility] ENDOSCOPY;  Service: Cardiovascular;  Laterality: N/A;  . HEMORROIDECTOMY     . HERNIA REPAIR       Current Outpatient Medications  Medication Sig Dispense Refill  . dofetilide (TIKOSYN) 500 MCG capsule Take 1 capsule (500 mcg total) by mouth 2 (two) times daily. 60 capsule 6  . Glycopyrrolate-Formoterol (BEVESPI AEROSPHERE) 9-4.8 MCG/ACT AERO Inhale 1 puff daily into the lungs.    . magnesium oxide (MAG-OX) 400 (241.3 Mg) MG tablet Take 1 tablet (400 mg total) by mouth daily. 30 tablet 6  . metoprolol tartrate (LOPRESSOR) 50 MG tablet TAKE 1 AND 1/2 TABLETS BY MOUTH 2 TIMES DAILY STOP CARVEDILOL 90 tablet 3  . pantoprazole (PROTONIX) 40 MG tablet Take 40 mg daily as needed by mouth. For acid reflux/indigestion  3  . rosuvastatin (CRESTOR) 10 MG tablet Take 1 tablet (10 mg total) by mouth daily. 90 tablet 3   No current facility-administered medications for this visit.     Allergies:   Patient has no known allergies.   Social History:  The patient  reports that he has quit smoking. He has a 30.00 pack-year smoking history. He has never used smokeless tobacco. He reports that he drinks about 2.4 oz of alcohol per week. He reports that he does not use drugs.   Family History:  The patient's family history includes Atrial fibrillation in his brother; CAD in his brother; COPD in his father and mother.    ROS:  Please see the history of present illness.   Otherwise, review of systems is positive for shortness of breath, diarrhea, rash.   All other systems are reviewed and negative.  PHYSICAL EXAM: VS:  BP 134/78   Pulse 66   Ht 5\' 10"  (1.778 m)   Wt 228 lb 3.2 oz (103.5 kg)   SpO2 94%   BMI 32.74 kg/m  , BMI Body mass index is 32.74 kg/m. GEN: Well nourished, well developed, in no acute distress  HEENT: normal  Neck: no JVD, carotid bruits, or masses Cardiac: RRR; no murmurs, rubs, or gallops,no edema  Respiratory:  clear to auscultation bilaterally, normal work of breathing GI: soft, nontender, nondistended, + BS MS: no deformity or atrophy  Skin: warm  and dry Neuro:  Strength and sensation are intact Psych: euthymic mood, full affect  EKG:  EKG is ordered today. Personal review of the ekg ordered shows sinus rhythm, rate 66  Recent Labs: 03/29/2017: BUN 11; Creatinine, Ser 1.00; Magnesium 1.9; Potassium 4.4; Sodium 139    Lipid Panel     Component Value Date/Time   CHOL 150 07/10/2017 0000   TRIG 40 07/10/2017 0000   HDL 61 07/10/2017 0000   CHOLHDL 2.5 07/10/2017 0000   LDLCALC 81 07/10/2017 0000     Wt Readings from Last 3 Encounters:  08/07/17 228 lb 3.2 oz (103.5 kg)  06/07/17 224 lb (101.6 kg)  05/08/17 225 lb 12.8 oz (102.4 kg)      Other studies Reviewed: Additional studies/ records that were reviewed today include: outside cardiology notes   LVEF of 55% documented via echocardiogram on 12/01/2016  Coronary CT 06/27/17 1. Coronary calcium score of 4. This was 4647 percentile for age and sex matched control.  2. Normal coronary origin with right dominance.  3. Minimal non-obstructive CAD. Aggressive risk factor management is recommended.  ASSESSMENT AND PLAN:  1.  Long-standing persistent atrial fibrillation: Torsionally, his stroke risk is low and thus he is not anticoagulated.  He is currently on dofetilide and is tolerating the medication without issue.  We [name] plan to check a magnesium and potassium today.  This patients CHA2DS2-VASc Score and unadjusted Ischemic Stroke Rate (% per year) is equal to 0.2 % stroke rate/year from a score of 0  Above score calculated as 1 point each if present [CHF, HTN, DM, Vascular=MI/PAD/Aortic Plaque, Age if 65-74, or Male] Above score calculated as 2 points each if present [Age > 75, or Stroke/TIA/TE]   2. OSA: Is not yet been fitted for a CPAP mask.  [name] help him reschedule an appointment for that visit.  3.  Obesity: Weight loss encouraged  4.  Alcohol abuse: Cessation encouraged  5.  Shortness of breath: Patient attributes this to long history of tobacco  abuse.  I have encouraged him to discuss it with his primary physician.  Current medicines are reviewed at length with the patient today.   The patient does not have concerns regarding his medicines.  The following changes were made today: None  Labs/ tests ordered today include:  Orders Placed This Encounter  Procedures  . Basic Metabolic Panel (BMET)  . Magnesium  . EKG 12-Lead     Disposition:   FU with Faylinn Schwenn 6 months  Signed, Ravenna Legore Jorja LoaMartin Stone Spirito, MD  08/07/2017 12:19 PM     [facility] [address] [place] [id number] [phone] (office) [phone] (fax)

## 2017-08-07 NOTE — Patient Instructions (Signed)
Medication Instructions:  Your physician recommends that you continue on your current medications as directed. Please refer to the Current Medication list given to you today.  Labwork: Today: BMET & Magnesium     *We will only notify you of abnormal results, otherwise continue current treatment plan.  Testing/Procedures: None ordered  Follow-Up: Your physician wants you to follow-up in: 6 months with Dr. Elberta Fortisamnitz.  You will receive a reminder letter in the mail two months in advance. If you don't receive a letter, please call our office to schedule the follow-up appointment.   * If you need a refill on your cardiac medications before your next appointment, please call your pharmacy.   *Please note that any paperwork needing to be filled out by the provider will need to be addressed at the front desk prior to seeing the provider. Please note that any FMLA, disability or other documents regarding health condition is subject to a $25.00 charge that must be received prior to completion of paperwork in the form of a money order or check.  Thank you for choosing [facility]!!   Dory HornSherri Mazell Aylesworth, RN [phone]

## 2017-08-08 LAB — BASIC METABOLIC PANEL
BUN / CREAT RATIO: 11 (ref 9–20)
BUN: 12 mg/dL (ref 6–24)
CALCIUM: 9.3 mg/dL (ref 8.7–10.2)
CO2: 26 mmol/L (ref 20–29)
Chloride: 99 mmol/L (ref 96–106)
Creatinine, Ser: 1.07 mg/dL (ref 0.76–1.27)
GFR calc Af Amer: 90 mL/min/{1.73_m2} (ref >59)
GFR, EST NON AFRICAN AMERICAN: 78 mL/min/{1.73_m2} (ref >59)
Glucose: 87 mg/dL (ref 65–99)
Potassium: 5 mmol/L (ref 3.5–5.2)
Sodium: 138 mmol/L (ref 134–144)

## 2017-08-08 LAB — MAGNESIUM: Magnesium: 2.1 mg/dL (ref 1.6–2.3)

## 2017-10-09 ENCOUNTER — Other Ambulatory Visit: Payer: Medicaid Other

## 2017-10-25 ENCOUNTER — Other Ambulatory Visit: Payer: Self-pay | Admitting: Physician Assistant

## 2017-11-24 ENCOUNTER — Other Ambulatory Visit: Payer: Self-pay | Admitting: Cardiology

## 2018-02-08 ENCOUNTER — Ambulatory Visit: Payer: Medicaid Other | Admitting: Cardiology

## 2018-02-08 ENCOUNTER — Encounter: Payer: Self-pay | Admitting: Cardiology

## 2018-02-08 VITALS — BP 102/68 | HR 67 | Ht 70.0 in | Wt 239.6 lb

## 2018-02-08 DIAGNOSIS — R0609 Other forms of dyspnea: Secondary | ICD-10-CM | POA: Diagnosis not present

## 2018-02-08 DIAGNOSIS — Z01812 Encounter for preprocedural laboratory examination: Secondary | ICD-10-CM

## 2018-02-08 DIAGNOSIS — Z79899 Other long term (current) drug therapy: Secondary | ICD-10-CM

## 2018-02-08 DIAGNOSIS — I4819 Other persistent atrial fibrillation: Secondary | ICD-10-CM | POA: Diagnosis not present

## 2018-02-08 DIAGNOSIS — R079 Chest pain, unspecified: Secondary | ICD-10-CM | POA: Diagnosis not present

## 2018-02-08 DIAGNOSIS — G4733 Obstructive sleep apnea (adult) (pediatric): Secondary | ICD-10-CM

## 2018-02-08 NOTE — Progress Notes (Signed)
Electrophysiology Office Note   Date:  02/08/2018   ID:  [name] [name], DOB 09/26/1960, MRN [id number]  PCP:  Dois Davenportichter, Karen L, MD  Cardiologist:  [name] [name] Primary Electrophysiologist:  Dr Elberta Fortisamnitz    CC: Follow up for atrial fibrilaltion   History of Present Illness: [name] [name] is a 58 y.o. male who is being seen today for the evaluation of atrial fibrillation at the request of [name] [name]. Presenting today for electrophysiology evaluation.  He has a history of GERD, COPD, obesity, and atrial fibrillation.  He had an attempted cardioversion on 12/22/16, but was not able to be converted to sinus rhythm.  After cardioversion attempts, the patient said that he had potentially been out of rhythm for 3 or 4 years.  He has mild left atrial enlargement with mild LVH and normal systolic function.  He was admitted to the hospital for dofetilide loading.  He also had a sleep study and was found to have obstructive sleep apnea however, he admits that he has not yet picked up his CPAP machine.   Today, he describes worsening dyspnea on exertion over the last two months. A few weeks ago, he also had chest "fullness" associated with his dyspnea which radiate to his neck and left arm. The chest discomfort as abated somewhat in the last week but his shortness of breath persists. He is in normal rhythm today.  He denies symptoms of palpitations, orthopnea, PND, lower extremity edema, claudication, dizziness, presyncope, syncope, bleeding, or neurologic sequela. The patient is tolerating medications without difficulties.   Past Medical History:  Diagnosis Date  . COPD (chronic obstructive pulmonary disease) (HCC)   . Hypertension   . Obesity (BMI 30-39.9) 12/15/2016  . Persistent atrial fibrillation 12/15/2016   Past Surgical History:  Procedure Laterality Date  . CARDIOVERSION N/A 12/22/2016   Procedure: CARDIOVERSION;  Surgeon: Othella Boyerilley, [name] S, MD;  [facility];  Service:  Cardiovascular;  Laterality: N/A;  . HEMORROIDECTOMY    . HERNIA REPAIR       Current Outpatient Medications  Medication Sig Dispense Refill  . dofetilide (TIKOSYN) 500 MCG capsule TAKE 1 CAPSULE BY MOUTH 2 TIMES DAILY. 180 capsule 3  . Glycopyrrolate-Formoterol (BEVESPI AEROSPHERE) 9-4.8 MCG/ACT AERO Inhale 1 puff daily into the lungs.    . magnesium oxide (MAG-OX) 400 (241.3 Mg) MG tablet Take 1 tablet (400 mg total) by mouth daily. 30 tablet 6  . metoprolol tartrate (LOPRESSOR) 50 MG tablet TAKE 1 AND 1/2 TABLETS BY MOUTH 2 TIMES DAILY STOP CARVEDILOL 90 tablet 9  . pantoprazole (PROTONIX) 40 MG tablet Take 40 mg daily as needed by mouth. For acid reflux/indigestion  3  . rosuvastatin (CRESTOR) 10 MG tablet Take 1 tablet (10 mg total) by mouth daily. 90 tablet 3   No current facility-administered medications for this visit.     Allergies:   Patient has no known allergies.   Social History:  The patient  reports that he has quit smoking. He has a 30.00 pack-year smoking history. He has never used smokeless tobacco. He reports current alcohol use of about 4.0 standard drinks of alcohol per week. He reports that he does not use drugs.   Family History:  The patient'[name] family history includes Atrial fibrillation in his brother; CAD in his brother; COPD in his father and mother.    ROS:  Please see the history of present illness.  All other systems are reviewed and negative.   PHYSICAL EXAM: VS:  BP 102/68  Pulse 67   Ht 5\' 10"  (1.778 m)   Wt 239 lb 9.6 oz (108.7 kg)   SpO2 97%   BMI 34.38 kg/m  , BMI Body mass index is 34.38 kg/m. GEN: Well nourished, well developed, in no acute distress  HEENT: normal  Neck: no JVD, carotid bruits, or masses Cardiac: RRR; no murmurs, rubs, or gallops,no edema  Respiratory:  Diminished breath sounds, clear to auscultation bilaterally, normal work of breathing GI: soft, nontender, nondistended, + BS MS: no deformity or atrophy  Skin: warm and  dry Neuro:  Strength and sensation are intact Psych: euthymic mood, full affect  EKG:  EKG is ordered today. Personal review of the ekg ordered shows SR HR 67, LAD, PR 196, QRS 88, QTc 490 by manual calculation   Recent Labs: 08/07/2017: BUN 12; Creatinine, Ser 1.07; Magnesium 2.1; Potassium 5.0; Sodium 138    Lipid Panel     Component Value Date/Time   CHOL 150 07/10/2017 0000   TRIG 40 07/10/2017 0000   HDL 61 07/10/2017 0000   CHOLHDL 2.5 07/10/2017 0000   LDLCALC 81 07/10/2017 0000     Wt Readings from Last 3 Encounters:  02/08/18 239 lb 9.6 oz (108.7 kg)  08/07/17 228 lb 3.2 oz (103.5 kg)  06/07/17 224 lb (101.6 kg)      Other studies Reviewed: Additional studies/ records that were reviewed today include: outside cardiology notes   LVEF of 55% documented via echocardiogram on 12/01/2016  Coronary CT 06/27/17 1. Coronary calcium score of 4. This was 62 percentile for age and sex matched control.  2. Normal coronary origin with right dominance.  3. Minimal non-obstructive CAD. Aggressive risk factor management is recommended.  ASSESSMENT AND PLAN:  1.  Long-standing persistent atrial fibrillation: He is currently on dofetilide and is tolerating the medication without issue. No symptoms of heart racing noted. Continue dofetilide 500 mcg BID Continue metoprolol 75 mg BID Check Bmet/mag  This patients CHA2DS2-VASc Score and unadjusted Ischemic Stroke Rate (% per year) is equal to 0.2 % stroke rate/year from a score of 0  Above score calculated as 1 point each if present [CHF, HTN, DM, Vascular=MI/PAD/Aortic Plaque, Age if 65-74, or Male] Above score calculated as 2 points each if present [Age > 75, or Stroke/TIA/TE]    2. OSA: He has been fitted for a CPAP mask but has not picked up his machine. Encouraged him to do this as soon as he is able.  3.  Obesity: Diet and exercise encouraged  4.  Alcohol abuse: Cessation encouraged  5.  Shortness of  breath/chest pain: History of tobacco abuse and COPD. Dyspnea on exertion worse lately and associated with radiating chest discomfort. CT review from 07/2017 which showed calcium score 4 in the 47th percentile, minimal non-obstructive CAD. Given typical anginal symptoms, [name] [name] arrange for elective LHC to further define coronary anatomy. Risk and benefits of procedure discussed, patient voiced understanding and is willing to proceed. Check CBC/Bmet Continue statin therapy. Check lipid panel and LFT  Current medicines are reviewed at length with the patient today.   The patient does not have concerns regarding his medicines.  The following changes were made today: none  Labs/ tests ordered today include:  Orders Placed This Encounter  Procedures  . Basic metabolic panel  . CBC  . Lipid Profile  . Magnesium  . EKG 12-Lead     Disposition:   FU with [name] [name] 6 months, or sooner pending LHC results.  Signed, [name]  Shauna Hugh, PA  02/08/2018 1:52 PM     [facility] [address] [place] [address] [phone] (office) [phone] (fax)   I have seen and examined this patient with [name] [name].  Agree with above, note added to reflect my findings.  On exam, RRR, no murmurs, lungs clear.  Fortunately, he remains in sinus rhythm on dofetilide.  He does complain of shortness of breath with exertion as well as chest and abdominal pain that radiates to his left arm.  It is relieved with rest.  He does have a history of COPD and is on on medications for this at this time.  His shortness of breath is the most concerning symptom to him which worsened acutely.  He did have a CT in the spring 2019 which showed no major coronary obstruction, but due to the very typical symptoms, we [name] [name] plan for left heart catheterization.  The patient understands that risks include but are not limited to stroke (1 in 1000), death (1 in 1000), kidney failure [usually temporary] (1 in 500),  bleeding (1 in 200), allergic reaction [possibly serious] (1 in 200), and agrees to proceed.   [name] [name] M. Anaira Seay MD 02/08/2018 2:21 PM

## 2018-02-08 NOTE — H&P (View-Only) (Signed)
 Electrophysiology Office Note   Date:  02/08/2018   ID:  [name] [name], DOB 02/15/1960, MRN [id number]  PCP:  Richter, Karen L, MD  Cardiologist:  Tilley Primary Electrophysiologist:  Dr Winthrop Shannahan    CC: Follow up for atrial fibrilaltion   History of Present Illness: [name] [name] is a 57 y.o. male who is being seen today for the evaluation of atrial fibrillation at the request of Spencer Tilley. Presenting today for electrophysiology evaluation.  He has a history of GERD, COPD, obesity, and atrial fibrillation.  He had an attempted cardioversion on 12/22/16, but was not able to be converted to sinus rhythm.  After cardioversion attempts, the patient said that he had potentially been out of rhythm for 3 or 4 years.  He has mild left atrial enlargement with mild LVH and normal systolic function.  He was admitted to the hospital for dofetilide loading.  He also had a sleep study and was found to have obstructive sleep apnea however, he admits that he has not yet picked up his CPAP machine.   Today, he describes worsening dyspnea on exertion over the last two months. A few weeks ago, he also had chest "fullness" associated with his dyspnea which radiate to his neck and left arm. The chest discomfort as abated somewhat in the last week but his shortness of breath persists. He is in normal rhythm today.  He denies symptoms of palpitations, orthopnea, PND, lower extremity edema, claudication, dizziness, presyncope, syncope, bleeding, or neurologic sequela. The patient is tolerating medications without difficulties.   Past Medical History:  Diagnosis Date  . COPD (chronic obstructive pulmonary disease) (HCC)   . Hypertension   . Obesity (BMI 30-39.9) 12/15/2016  . Persistent atrial fibrillation 12/15/2016   Past Surgical History:  Procedure Laterality Date  . CARDIOVERSION N/A 12/22/2016   Procedure: CARDIOVERSION;  Surgeon: Tilley, William S, MD;  Location: [facility] ENDOSCOPY;  Service:  Cardiovascular;  Laterality: N/A;  . HEMORROIDECTOMY    . HERNIA REPAIR       Current Outpatient Medications  Medication Sig Dispense Refill  . dofetilide (TIKOSYN) 500 MCG capsule TAKE 1 CAPSULE BY MOUTH 2 TIMES DAILY. 180 capsule 3  . Glycopyrrolate-Formoterol (BEVESPI AEROSPHERE) 9-4.8 MCG/ACT AERO Inhale 1 puff daily into the lungs.    . magnesium oxide (MAG-OX) 400 (241.3 Mg) MG tablet Take 1 tablet (400 mg total) by mouth daily. 30 tablet 6  . metoprolol tartrate (LOPRESSOR) 50 MG tablet TAKE 1 AND 1/2 TABLETS BY MOUTH 2 TIMES DAILY STOP CARVEDILOL 90 tablet 9  . pantoprazole (PROTONIX) 40 MG tablet Take 40 mg daily as needed by mouth. For acid reflux/indigestion  3  . rosuvastatin (CRESTOR) 10 MG tablet Take 1 tablet (10 mg total) by mouth daily. 90 tablet 3   No current facility-administered medications for this visit.     Allergies:   Patient has no known allergies.   Social History:  The patient  reports that he has quit smoking. He has a 30.00 pack-year smoking history. He has never used smokeless tobacco. He reports current alcohol use of about 4.0 standard drinks of alcohol per week. He reports that he does not use drugs.   Family History:  The patient's family history includes Atrial fibrillation in his brother; CAD in his brother; COPD in his father and mother.    ROS:  Please see the history of present illness.  All other systems are reviewed and negative.   PHYSICAL EXAM: VS:  BP 102/68     Pulse 67   Ht 5\' 10"  (1.778 m)   Wt 239 lb 9.6 oz (108.7 kg)   SpO2 97%   BMI 34.38 kg/m  , BMI Body mass index is 34.38 kg/m. GEN: Well nourished, well developed, in no acute distress  HEENT: normal  Neck: no JVD, carotid bruits, or masses Cardiac: RRR; no murmurs, rubs, or gallops,no edema  Respiratory:  Diminished breath sounds, clear to auscultation bilaterally, normal work of breathing GI: soft, nontender, nondistended, + BS MS: no deformity or atrophy  Skin: warm and  dry Neuro:  Strength and sensation are intact Psych: euthymic mood, full affect  EKG:  EKG is ordered today. Personal review of the ekg ordered shows SR HR 67, LAD, PR 196, QRS 88, QTc 490 by manual calculation   Recent Labs: 08/07/2017: BUN 12; Creatinine, Ser 1.07; Magnesium 2.1; Potassium 5.0; Sodium 138    Lipid Panel     Component Value Date/Time   CHOL 150 07/10/2017 0000   TRIG 40 07/10/2017 0000   HDL 61 07/10/2017 0000   CHOLHDL 2.5 07/10/2017 0000   LDLCALC 81 07/10/2017 0000     Wt Readings from Last 3 Encounters:  02/08/18 239 lb 9.6 oz (108.7 kg)  08/07/17 228 lb 3.2 oz (103.5 kg)  06/07/17 224 lb (101.6 kg)      Other studies Reviewed: Additional studies/ records that were reviewed today include: outside cardiology notes   LVEF of 55% documented via echocardiogram on 12/01/2016  Coronary CT 06/27/17 1. Coronary calcium score of 4. This was 62 percentile for age and sex matched control.  2. Normal coronary origin with right dominance.  3. Minimal non-obstructive CAD. Aggressive risk factor management is recommended.  ASSESSMENT AND PLAN:  1.  Long-standing persistent atrial fibrillation: He is currently on dofetilide and is tolerating the medication without issue. No symptoms of heart racing noted. Continue dofetilide 500 mcg BID Continue metoprolol 75 mg BID Check Bmet/mag  This patients CHA2DS2-VASc Score and unadjusted Ischemic Stroke Rate (% per year) is equal to 0.2 % stroke rate/year from a score of 0  Above score calculated as 1 point each if present [CHF, HTN, DM, Vascular=MI/PAD/Aortic Plaque, Age if 65-74, or Male] Above score calculated as 2 points each if present [Age > 75, or Stroke/TIA/TE]    2. OSA: He has been fitted for a CPAP mask but has not picked up his machine. Encouraged him to do this as soon as he is able.  3.  Obesity: Diet and exercise encouraged  4.  Alcohol abuse: Cessation encouraged  5.  Shortness of  breath/chest pain: History of tobacco abuse and COPD. Dyspnea on exertion worse lately and associated with radiating chest discomfort. CT review from 07/2017 which showed calcium score 4 in the 47th percentile, minimal non-obstructive CAD. Given typical anginal symptoms, [name] [name] arrange for elective LHC to further define coronary anatomy. Risk and benefits of procedure discussed, patient voiced understanding and is willing to proceed. Check CBC/Bmet Continue statin therapy. Check lipid panel and LFT  Current medicines are reviewed at length with the patient today.   The patient does not have concerns regarding his medicines.  The following changes were made today: none  Labs/ tests ordered today include:  Orders Placed This Encounter  Procedures  . Basic metabolic panel  . CBC  . Lipid Profile  . Magnesium  . EKG 12-Lead     Disposition:   FU with [name] [name] 6 months, or sooner pending LHC results.  Signed, [name]  Shauna Hugh, PA  02/08/2018 1:52 PM     [facility] [address] [place] [address] [phone] (office) [phone] (fax)   I have seen and examined this patient with [name] [name].  Agree with above, note added to reflect my findings.  On exam, RRR, no murmurs, lungs clear.  Fortunately, he remains in sinus rhythm on dofetilide.  He does complain of shortness of breath with exertion as well as chest and abdominal pain that radiates to his left arm.  It is relieved with rest.  He does have a history of COPD and is on on medications for this at this time.  His shortness of breath is the most concerning symptom to him which worsened acutely.  He did have a CT in the spring 2019 which showed no major coronary obstruction, but due to the very typical symptoms, we [name] [name] plan for left heart catheterization.  The patient understands that risks include but are not limited to stroke (1 in 1000), death (1 in 1000), kidney failure [usually temporary] (1 in 500),  bleeding (1 in 200), allergic reaction [possibly serious] (1 in 200), and agrees to proceed.   [name] [name] M. Lidia Clavijo MD 02/08/2018 2:21 PM

## 2018-02-08 NOTE — Patient Instructions (Addendum)
Medication Instructions:  Your physician recommends that you continue on your current medications as directed. Please refer to the Current Medication list given to you today.  * If you need a refill on your cardiac medications before your next appointment, please call your pharmacy.   Labwork: Today: BMET, CBC, Magnesium & Lipid profile *We will only notify you of abnormal results, otherwise continue current treatment plan.  Testing/Procedures: Your physician has requested that you have a cardiac catheterization. Cardiac catheterization is used to diagnose and/or treat various heart conditions. Doctors may recommend this procedure for a number of different reasons. The most common reason is to evaluate chest pain. Chest pain can be a symptom of coronary artery disease (CAD), and cardiac catheterization can show whether plaque is narrowing or blocking your heart's arteries. This procedure is also used to evaluate the valves, as well as measure the blood flow and oxygen levels in different parts of your heart. For further information please visit [email or web address]. Please follow instructions below located under special instructions area  Follow-Up: Your physician wants you to follow-up in: 6 months with Dr. Elberta Fortisamnitz.  You will receive a reminder letter in the mail two months in advance. If you don't receive a letter, please call our office to schedule the follow-up appointment.  Thank you for choosing [facility]!!   Dory HornSherri Jahlisa Rossitto, RN [phone]  Any Other Special Instructions Will Be Listed Below (If Applicable).  CATH INSTRUCTIONS You are scheduled for a Cardiac Catheterization on Friday, January 17 with Dr. Cristal Deerhristopher End.  1. Please [facility] at [facility]: [address] [place], [place] [address] at 8:30 AM (This time is two hours before your procedure to ensure your preparation). Free valet parking service is available.   Special  note: Every effort is made to have your procedure done on time. Please understand that emergencies sometimes delay scheduled procedures.  2. Diet: Do not eat solid foods after midnight.   3. Labs: You will need to have blood drawn on Friday, January 3 at [facility] at [facility] You do not need to be fasting.  4. Medication instructions in preparation for your procedure:  On the morning of your procedure, take Aspirin 81 mg and any morning medicines NOT listed above.  You may use sips of water.  5. Plan for one night stay--bring personal belongings. 6. Bring a current list of your medications and current insurance cards. 7. You MUST have a responsible person to drive you home. 8. Someone MUST be with you the first 24 hours after you arrive home or your discharge will be delayed. 9. Please wear clothes that are easy to get on and off and wear slip-on shoes.  Thank you for allowing us to care for you!   -- [place] [facility]      \

## 2018-02-09 LAB — BASIC METABOLIC PANEL
BUN/Creatinine Ratio: 15 (ref 9–20)
BUN: 15 mg/dL (ref 6–24)
CO2: 27 mmol/L (ref 20–29)
Calcium: 9.3 mg/dL (ref 8.7–10.2)
Chloride: 95 mmol/L — ABNORMAL LOW (ref 96–106)
Creatinine, Ser: 0.98 mg/dL (ref 0.76–1.27)
GFR calc Af Amer: 100 mL/min/{1.73_m2} (ref >59)
GFR calc non Af Amer: 86 mL/min/{1.73_m2} (ref >59)
Glucose: 91 mg/dL (ref 65–99)
Potassium: 4.7 mmol/L (ref 3.5–5.2)
SODIUM: 138 mmol/L (ref 134–144)

## 2018-02-09 LAB — CBC
HEMATOCRIT: 43.6 % (ref 37.5–51.0)
Hemoglobin: 15.2 g/dL (ref 13.0–17.7)
MCH: 32.1 pg (ref 26.6–33.0)
MCHC: 34.9 g/dL (ref 31.5–35.7)
MCV: 92 fL (ref 79–97)
Platelets: 251 10*3/uL (ref 150–450)
RBC: 4.74 x10E6/uL (ref 4.14–5.80)
RDW: 12.5 % (ref 12.3–15.4)
WBC: 7 10*3/uL (ref 3.4–10.8)

## 2018-02-09 LAB — LIPID PANEL
CHOLESTEROL TOTAL: 183 mg/dL (ref 100–199)
Chol/HDL Ratio: 3.9 ratio (ref 0.0–5.0)
HDL: 47 mg/dL (ref >39)
LDL Calculated: 111 mg/dL — ABNORMAL HIGH (ref 0–99)
TRIGLYCERIDES: 125 mg/dL (ref 0–149)
VLDL CHOLESTEROL CAL: 25 mg/dL (ref 5–40)

## 2018-02-09 LAB — MAGNESIUM: Magnesium: 2.1 mg/dL (ref 1.6–2.3)

## 2018-02-13 ENCOUNTER — Telehealth: Payer: Self-pay | Admitting: *Deleted

## 2018-02-13 MED ORDER — ROSUVASTATIN CALCIUM 40 MG PO TABS: 40.0000 mg | ORAL_TABLET | Freq: Every day | ORAL | 3 refills | Status: DC

## 2018-02-13 NOTE — Telephone Encounter (Signed)
-----   Message from Will Jorja Loa, MD sent at 02/12/2018 12:23 PM EST ----- Stable preop labs Increase crestor to 40 mg daily.

## 2018-02-13 NOTE — Telephone Encounter (Signed)
Pt has been notified of lab results. Pt is agreeable to recommendation per Dr. Elberta Fortis to increase Crestor to 40 mg daily; new Rx has been sent in. Pt does states he has not been faithful about taking his Crestor everyday. I stressed the importance of making sure to take his medication everyday. Pt is agreeable to plan of care. Pt thanked me for the call. Per Dr. Rodrigo Ran note he is to f/u in 6 months. I do not see a recall. I will place a recall in the system.

## 2018-02-21 ENCOUNTER — Telehealth: Payer: Self-pay | Admitting: *Deleted

## 2018-02-21 NOTE — Telephone Encounter (Signed)
Pt contacted [facility]-catheterization scheduled at [facility] for: Friday February 22, 2018 12 noon Verified arrival time and place: [facility] Main Entrance A at: 10 AM  No solid food after midnight prior to cath, clear liquids until 5 AM day of procedure.  AM meds can be  taken pre-cath with sip of water including: ASA 81 mg  Pt states he has someone to drive him home after procedure, but does not have responsible person to observe him at home after procedure.  Pt states his circumstances would be the same regardless of day of procedure.  I have notified Victorino DikeJennifer, cath lab.

## 2018-02-22 ENCOUNTER — Other Ambulatory Visit: Payer: Self-pay

## 2018-02-22 ENCOUNTER — Ambulatory Visit (HOSPITAL_COMMUNITY)
Admission: RE | Admit: 2018-02-22 | Discharge: 2018-02-23 | Disposition: A | Discharge status: Home / Self Care | Payer: Medicaid Other | Attending: Internal Medicine | Admitting: Internal Medicine

## 2018-02-22 ENCOUNTER — Encounter (HOSPITAL_COMMUNITY): Payer: Self-pay | Admitting: General Practice

## 2018-02-22 ENCOUNTER — Encounter (HOSPITAL_COMMUNITY)
Admission: RE | Disposition: A | Discharge status: Home / Self Care | Payer: Self-pay | Source: Home / Self Care | Attending: Internal Medicine

## 2018-02-22 DIAGNOSIS — I5030 Unspecified diastolic (congestive) heart failure: Secondary | ICD-10-CM | POA: Diagnosis present

## 2018-02-22 DIAGNOSIS — Z8249 Family history of ischemic heart disease and other diseases of the circulatory system: Secondary | ICD-10-CM | POA: Insufficient documentation

## 2018-02-22 DIAGNOSIS — J449 Chronic obstructive pulmonary disease, unspecified: Secondary | ICD-10-CM | POA: Insufficient documentation

## 2018-02-22 DIAGNOSIS — I4811 Longstanding persistent atrial fibrillation: Secondary | ICD-10-CM | POA: Insufficient documentation

## 2018-02-22 DIAGNOSIS — Z79899 Other long term (current) drug therapy: Secondary | ICD-10-CM | POA: Diagnosis not present

## 2018-02-22 DIAGNOSIS — E119 Type 2 diabetes mellitus without complications: Secondary | ICD-10-CM | POA: Diagnosis not present

## 2018-02-22 DIAGNOSIS — Z23 Encounter for immunization: Secondary | ICD-10-CM | POA: Insufficient documentation

## 2018-02-22 DIAGNOSIS — R0602 Shortness of breath: Secondary | ICD-10-CM | POA: Diagnosis present

## 2018-02-22 DIAGNOSIS — I209 Angina pectoris, unspecified: Secondary | ICD-10-CM | POA: Diagnosis present

## 2018-02-22 DIAGNOSIS — G4733 Obstructive sleep apnea (adult) (pediatric): Secondary | ICD-10-CM | POA: Insufficient documentation

## 2018-02-22 DIAGNOSIS — I110 Hypertensive heart disease with heart failure: Secondary | ICD-10-CM | POA: Diagnosis not present

## 2018-02-22 DIAGNOSIS — I25119 Atherosclerotic heart disease of native coronary artery with unspecified angina pectoris: Secondary | ICD-10-CM | POA: Diagnosis not present

## 2018-02-22 DIAGNOSIS — Z87891 Personal history of nicotine dependence: Secondary | ICD-10-CM | POA: Diagnosis not present

## 2018-02-22 DIAGNOSIS — R0609 Other forms of dyspnea: Secondary | ICD-10-CM | POA: Insufficient documentation

## 2018-02-22 DIAGNOSIS — E669 Obesity, unspecified: Secondary | ICD-10-CM | POA: Diagnosis not present

## 2018-02-22 DIAGNOSIS — Z6834 Body mass index (BMI) 34.0-34.9, adult: Secondary | ICD-10-CM | POA: Diagnosis not present

## 2018-02-22 DIAGNOSIS — Z825 Family history of asthma and other chronic lower respiratory diseases: Secondary | ICD-10-CM | POA: Diagnosis not present

## 2018-02-22 HISTORY — PX: RIGHT/LEFT HEART CATH AND CORONARY ANGIOGRAPHY: CATH118266

## 2018-02-22 HISTORY — PX: CARDIAC CATHETERIZATION: SHX172

## 2018-02-22 LAB — POCT I-STAT 3, VENOUS BLOOD GAS (G3P V)
Acid-Base Excess: 4 mmol/L — ABNORMAL HIGH (ref 0.0–2.0)
Bicarbonate: 31.3 mmol/L — ABNORMAL HIGH (ref 20.0–28.0)
O2 Saturation: 71 %
TCO2: 33 mmol/L — ABNORMAL HIGH (ref 22–32)
pCO2, Ven: 59.9 mmHg (ref 44.0–60.0)
pH, Ven: 7.327 (ref 7.250–7.430)
pO2, Ven: 41 mmHg (ref 32.0–45.0)

## 2018-02-22 LAB — POCT I-STAT 3, ART BLOOD GAS (G3+)
Acid-Base Excess: 3 mmol/L — ABNORMAL HIGH (ref 0.0–2.0)
Bicarbonate: 29.7 mmol/L — ABNORMAL HIGH (ref 20.0–28.0)
O2 Saturation: 98 %
TCO2: 31 mmol/L (ref 22–32)
pCO2 arterial: 54.5 mmHg — ABNORMAL HIGH (ref 32.0–48.0)
pH, Arterial: 7.344 — ABNORMAL LOW (ref 7.350–7.450)
pO2, Arterial: 110 mmHg — ABNORMAL HIGH (ref 83.0–108.0)

## 2018-02-22 SURGERY — RIGHT/LEFT HEART CATH AND CORONARY ANGIOGRAPHY
Anesthesia: LOCAL

## 2018-02-22 MED ORDER — FENTANYL CITRATE (PF) 100 MCG/2ML IJ SOLN
INTRAMUSCULAR | Status: DC | PRN
  Administered 2018-02-22: 25 ug via INTRAVENOUS

## 2018-02-22 MED ORDER — VERAPAMIL HCL 2.5 MG/ML IV SOLN
INTRAVENOUS | Status: AC
  Filled 2018-02-22: qty 2

## 2018-02-22 MED ORDER — SODIUM CHLORIDE 0.9 % WEIGHT BASED INFUSION: 1.0000 mL/kg/h | INTRAVENOUS | Status: DC

## 2018-02-22 MED ORDER — SODIUM CHLORIDE 0.9 % WEIGHT BASED INFUSION
3.0000 mL/kg/h | INTRAVENOUS | Status: DC
  Administered 2018-02-22: 3 mL/kg/h via INTRAVENOUS

## 2018-02-22 MED ORDER — FUROSEMIDE 40 MG PO TABS
40.0000 mg | ORAL_TABLET | Freq: Every day | ORAL | Status: DC
  Administered 2018-02-22 – 2018-02-23 (×2): 40 mg via ORAL
  Filled 2018-02-22 (×2): qty 1

## 2018-02-22 MED ORDER — SODIUM CHLORIDE 0.9% FLUSH: 3.0000 mL | INTRAVENOUS | Status: DC | PRN

## 2018-02-22 MED ORDER — LIDOCAINE HCL (PF) 1 % IJ SOLN
INTRAMUSCULAR | Status: AC
  Filled 2018-02-22: qty 30

## 2018-02-22 MED ORDER — METOPROLOL TARTRATE 25 MG PO TABS
75.0000 mg | ORAL_TABLET | Freq: Two times a day (BID) | ORAL | Status: DC
  Administered 2018-02-22: 21:00:00 75 mg via ORAL
  Filled 2018-02-22 (×2): qty 3

## 2018-02-22 MED ORDER — SODIUM CHLORIDE 0.9 % IV SOLN: 250.0000 mL | INTRAVENOUS | Status: DC | PRN

## 2018-02-22 MED ORDER — SODIUM CHLORIDE 0.9% FLUSH: 3.0000 mL | Freq: Two times a day (BID) | INTRAVENOUS | Status: DC

## 2018-02-22 MED ORDER — HEPARIN SODIUM (PORCINE) 1000 UNIT/ML IJ SOLN
INTRAMUSCULAR | Status: DC | PRN
  Administered 2018-02-22: 5000 [IU] via INTRAVENOUS

## 2018-02-22 MED ORDER — HEPARIN SODIUM (PORCINE) 1000 UNIT/ML IJ SOLN
INTRAMUSCULAR | Status: AC
  Filled 2018-02-22: qty 1

## 2018-02-22 MED ORDER — ROSUVASTATIN CALCIUM 20 MG PO TABS
40.0000 mg | ORAL_TABLET | Freq: Every day | ORAL | Status: DC
  Administered 2018-02-22: 16:00:00 40 mg via ORAL
  Filled 2018-02-22: qty 2

## 2018-02-22 MED ORDER — FENTANYL CITRATE (PF) 100 MCG/2ML IJ SOLN
INTRAMUSCULAR | Status: AC
  Filled 2018-02-22: qty 2

## 2018-02-22 MED ORDER — MIDAZOLAM HCL 2 MG/2ML IJ SOLN
INTRAMUSCULAR | Status: AC
  Filled 2018-02-22: qty 2

## 2018-02-22 MED ORDER — ASPIRIN 81 MG PO CHEW
81.0000 mg | CHEWABLE_TABLET | Freq: Once | ORAL | Status: AC
  Administered 2018-02-23: 81 mg via ORAL
  Filled 2018-02-22: qty 1

## 2018-02-22 MED ORDER — SODIUM CHLORIDE 0.9% FLUSH
3.0000 mL | Freq: Two times a day (BID) | INTRAVENOUS | Status: DC
  Administered 2018-02-22 (×2): 3 mL via INTRAVENOUS

## 2018-02-22 MED ORDER — MIDAZOLAM HCL 2 MG/2ML IJ SOLN
INTRAMUSCULAR | Status: DC | PRN
  Administered 2018-02-22: 1 mg via INTRAVENOUS

## 2018-02-22 MED ORDER — VERAPAMIL HCL 2.5 MG/ML IV SOLN
INTRAVENOUS | Status: DC | PRN
  Administered 2018-02-22: 10 mL via INTRA_ARTERIAL

## 2018-02-22 MED ORDER — GUAIFENESIN-DM 100-10 MG/5ML PO SYRP
5.0000 mL | ORAL_SOLUTION | ORAL | Status: DC | PRN
  Administered 2018-02-22 – 2018-02-23 (×2): 5 mL via ORAL
  Filled 2018-02-22 (×2): qty 5

## 2018-02-22 MED ORDER — DIAZEPAM 5 MG PO TABS
ORAL_TABLET | ORAL | Status: AC
  Filled 2018-02-22: qty 1

## 2018-02-22 MED ORDER — DIAZEPAM 5 MG PO TABS
5.0000 mg | ORAL_TABLET | Freq: Once | ORAL | Status: AC
  Administered 2018-02-22: 5 mg via ORAL

## 2018-02-22 MED ORDER — ACETAMINOPHEN 325 MG PO TABS: 650.0000 mg | ORAL_TABLET | ORAL | Status: DC | PRN

## 2018-02-22 MED ORDER — INFLUENZA VAC SPLIT QUAD 0.5 ML IM SUSY
0.5000 mL | PREFILLED_SYRINGE | INTRAMUSCULAR | Status: AC
  Administered 2018-02-23: 0.5 mL via INTRAMUSCULAR
  Filled 2018-02-22 (×2): qty 0.5

## 2018-02-22 MED ORDER — ENOXAPARIN SODIUM 40 MG/0.4ML ~~LOC~~ SOLN
40.0000 mg | SUBCUTANEOUS | Status: DC
  Administered 2018-02-23: 09:00:00 40 mg via SUBCUTANEOUS
  Filled 2018-02-22: qty 0.4

## 2018-02-22 MED ORDER — HEPARIN (PORCINE) IN NACL 1000-0.9 UT/500ML-% IV SOLN
INTRAVENOUS | Status: AC
  Filled 2018-02-22: qty 1000

## 2018-02-22 MED ORDER — LIDOCAINE HCL (PF) 1 % IJ SOLN
INTRAMUSCULAR | Status: DC | PRN
  Administered 2018-02-22: 2 mL via INTRADERMAL
  Administered 2018-02-22: 2 mL

## 2018-02-22 MED ORDER — ASPIRIN 81 MG PO CHEW: 81.0000 mg | CHEWABLE_TABLET | ORAL | Status: DC

## 2018-02-22 MED ORDER — HEPARIN (PORCINE) IN NACL 1000-0.9 UT/500ML-% IV SOLN
INTRAVENOUS | Status: DC | PRN
  Administered 2018-02-22 (×2): 500 mL

## 2018-02-22 MED ORDER — DOFETILIDE 500 MCG PO CAPS
500.0000 ug | ORAL_CAPSULE | Freq: Two times a day (BID) | ORAL | Status: DC
  Administered 2018-02-22: 21:00:00 500 ug via ORAL
  Filled 2018-02-22 (×2): qty 1

## 2018-02-22 MED ORDER — IOHEXOL 350 MG/ML SOLN
INTRAVENOUS | Status: DC | PRN
  Administered 2018-02-22: 50 mL via INTRACARDIAC

## 2018-02-22 MED ORDER — METOPROLOL TARTRATE 25 MG PO TABS: 75.0000 mg | ORAL_TABLET | Freq: Two times a day (BID) | ORAL | Status: DC

## 2018-02-22 SURGICAL SUPPLY — 13 items
CATH BALLN WEDGE 5F 110CM (CATHETERS) ×1 IMPLANT
CATH IMPULSE 5F ANG/FL3.5 (CATHETERS) ×1 IMPLANT
DEVICE RAD COMP TR BAND LRG (VASCULAR PRODUCTS) ×1 IMPLANT
GLIDESHEATH SLEND SS 6F .021 (SHEATH) ×1 IMPLANT
GUIDEWIRE INQWIRE 1.5J.035X260 (WIRE) IMPLANT
INQWIRE 1.5J .035X260CM (WIRE) ×2
KIT HEART LEFT (KITS) ×2 IMPLANT
PACK CARDIAC CATHETERIZATION (CUSTOM PROCEDURE TRAY) ×2 IMPLANT
SHEATH GLIDE SLENDER 4/5FR (SHEATH) ×1 IMPLANT
TRANSDUCER W/STOPCOCK (MISCELLANEOUS) ×3 IMPLANT
TUBING ART PRESS 72  MALE/FEM (TUBING) ×1
TUBING ART PRESS 72 MALE/FEM (TUBING) IMPLANT
TUBING CIL FLEX 10 FLL-RA (TUBING) ×2 IMPLANT

## 2018-02-22 NOTE — Progress Notes (Signed)
TR BAND REMOVAL  LOCATION:    right radial  DEFLATED PER PROTOCOL:    Yes.    TIME BAND OFF / DRESSING APPLIED:    1530   SITE UPON ARRIVAL:    Level 0  SITE AFTER BAND REMOVAL:    Level 0  CIRCULATION SENSATION AND MOVEMENT:    Within Normal Limits   Yes.    COMMENTS:   Tolerated procedure well 

## 2018-02-22 NOTE — Interval H&P Note (Signed)
History and Physical Interval Note:  02/22/2018 12:19 PM  [name] [name]  has presented today for cardiac catheterization, with the diagnosis of chest pain and shortness of breath. The various methods of treatment have been discussed with the patient and family. After consideration of risks, benefits and other options for treatment, the patient has consented to  Procedure(s): RIGHT/LEFT HEART CATH AND CORONARY ANGIOGRAPHY (N/A) as a surgical intervention .  The patient's history has been reviewed, patient examined, no change in status, stable for surgery.  I have reviewed the patient's chart and labs.  Questions were answered to the patient's satisfaction.    Cath Lab Visit (complete for each Cath Lab visit)  Clinical Evaluation Leading to the Procedure:   ACS: No.  Non-ACS:    Anginal Classification: CCS III  Anti-ischemic medical therapy: Minimal Therapy (1 class of medications)  Non-Invasive Test Results: Low-risk stress test findings: cardiac mortality <1%/year  Prior CABG: No previous CABG  Balen Woolum

## 2018-02-22 NOTE — Progress Notes (Signed)
[name] [name] called and informed pt doesn't have anyone to stay with him over night. Also for Dr End to come see pt because he has questions.

## 2018-02-22 NOTE — Brief Op Note (Signed)
BRIEF CARDIAC CATHETERIZATION NOTE  DATE: 02/22/2018 TIME: 1:29 PM  PATIENT:  [name] [name]  58 y.o. male  PRE-OPERATIVE DIAGNOSIS:  Chest pain and shortness of breath  POST-OPERATIVE DIAGNOSIS:  Non-obstructive CAD, HFpEF, pulmonary hypertension  PROCEDURE:  Procedure(s): RIGHT/LEFT HEART CATH AND CORONARY ANGIOGRAPHY (N/A)  SURGEON:  Surgeon(s) and Role:    * Matilde Pottenger, Cristal Deer, MD - Primary  FINDINGS: 1. Non-obstructive CAD. 2. Mildly to moderate elevated left heart filling pressures. 3. Moderately to severely elevated right heart filling pressures. 4. Moderate pulmonary hypertension. 5. Equalization of [name]-diastolic pressures with respiratory concordance suggestive of restrictive physiology.  Significant respiratory variation in pressures noted. 6. Normal Fick cardiac output/index.  RECOMMENDATIONS: 1. Medical therapy of non-obstructive CAD. 2. Diuresis and evaluation/treatment of likely OSA. 3. If symptoms persist, consider further workup of potential restrictive cardiomyopathy.  Yvonne Kendall, MD [facility] Pager: [phone]

## 2018-02-23 DIAGNOSIS — I25119 Atherosclerotic heart disease of native coronary artery with unspecified angina pectoris: Secondary | ICD-10-CM | POA: Diagnosis not present

## 2018-02-23 DIAGNOSIS — I5031 Acute diastolic (congestive) heart failure: Secondary | ICD-10-CM

## 2018-02-23 DIAGNOSIS — R0602 Shortness of breath: Secondary | ICD-10-CM | POA: Diagnosis not present

## 2018-02-23 LAB — BASIC METABOLIC PANEL
ANION GAP: 10 (ref 5–15)
BUN: 16 mg/dL (ref 6–20)
CO2: 30 mmol/L (ref 22–32)
Calcium: 8.6 mg/dL — ABNORMAL LOW (ref 8.9–10.3)
Chloride: 98 mmol/L (ref 98–111)
Creatinine, Ser: 1.26 mg/dL — ABNORMAL HIGH (ref 0.61–1.24)
GFR calc Af Amer: >60 mL/min (ref >60)
GFR calc non Af Amer: >60 mL/min (ref >60)
Glucose, Bld: 111 mg/dL — ABNORMAL HIGH (ref 70–99)
Potassium: 4.1 mmol/L (ref 3.5–5.1)
Sodium: 138 mmol/L (ref 135–145)

## 2018-02-23 MED ORDER — FUROSEMIDE 40 MG PO TABS: 40.0000 mg | ORAL_TABLET | Freq: Every day | ORAL | 5 refills | Status: DC

## 2018-02-23 NOTE — Discharge Instructions (Signed)

## 2018-02-23 NOTE — Discharge Summary (Signed)
Discharge Summary    Patient ID: [name] [name] MRN: [id number]; DOB: 01/28/1961  Admit date: 02/22/2018 Discharge date: 02/23/2018  Primary Care Provider: Dois Davenportichter, Karen L, MD  Primary Cardiologist: Georga HackingW Spencer Tilley, MD  Primary Electrophysiologist:  Regan LemmingWill Martin Camnitz, MD   Discharge Diagnoses    Active Problems:   Angina pectoris [facility](HCC)   Shortness of breath   (HFpEF) heart failure with preserved ejection fraction [facility](HCC)   Allergies No Known Allergies  Diagnostic Studies/Procedures    RIGHT/LEFT HEART CATH AND CORONARY ANGIOGRAPHY  02/22/2018   Conclusions: 1. Mild, non-obstructive coronary artery disease. 2. Moderately to severely elevated left heart, right heart, and pulmonary artery pressures.  Hemodynamics suggest restrictive physiology. 3. Significant respiratory variation noted in intracardiac pressures, which can be seen with obstructive sleep apnea. 4. Low normal to mildly reduced Fick cardiac output/index.  Recommendations: 1. Medical therapy to prevent progression of coronary artery disease. 2. Diuresis and further evaluation/treatment for suspected obstructive sleep apnea. 3. Consider transthoracic echocardiogram. 4. If symptoms persist, advanced heart failure consultation may be helpful. 5. Overnight extended recovery, as patient does not have anyone to stay with him tonight.  Likely discharge in the morning.  [name], MD    _____________   History of Present Illness     [name] [name] [place] is a 58 y.o. male who is being seen today for the evaluation of atrial fibrillation at the request of Viann FishSpencer Tilley. Presenting today for electrophysiology evaluation.  He has a history of GERD, COPD, obesity, and atrial fibrillation.  He had an attempted cardioversion on 12/22/16, but was not able to be converted to sinus rhythm.  After cardioversion attempts, the patient said that he had potentially been out of rhythm for 3 or 4 years.  He has mild left  atrial enlargement with mild LVH and normal systolic function.  He was admitted to the hospital for dofetilide loading.  He also had a sleep study and was found to have obstructive sleep apnea however, he admits that he has not yet picked up his CPAP machine.   At office visit on 02/08/2018 patient was noted to have worsening dyspnea on exertion over the prior 2 months and a few weeks prior he had chest fullness associated with dyspnea which radiated to his neck and left arm.  His chest discomfort abated somewhat but shortness of breath persisted.  He was in normal sinus rhythm.  He had had a CT in 07/2017 that showed a calcium score of 4 with minimal nonobstructive CAD.  Given his typical anginal symptoms he was arranged for elective left heart cath to define coronary anatomy.   Hospital Course     Consultants: None  The patient underwent left heart cath on 02/22/2018 which revealed mild nonobstructive CAD, moderately to severely elevated left heart, right heart and pulmonary artery pressures.  Hemodynamics suggested restrictive physiology.  Significant respiratory variation was noted in intracardiac pressures, which can be seen with obstructive sleep apnea.  He had low normal to mildly reduced Fick cardiac output/index.  The patient will benefit from medical therapy to prevent progression of coronary artery disease.  He is on a Crestor which was increased last week with LDL 111. Will need F/U FLP and AST in 4-6 weeks.  Lasix 40 mg daily has been initiated for diuresis. The patient has been counseled on low-sodium diet.  He has maintained sinus rhythm on his home dofetilide.  The patient has been counseled on the benefits of using CPAP to treat sleep  apnea.  We will arrange for close outpatient follow-up.  If symptoms persist, advanced heart failure consultation may be helpful.  The patient was monitored overnight.  His right radial cath site is unremarkable.  He still has mild intermittent shortness  of breath, no chest discomfort.  Patient has been seen by Dr. Rennis Golden today and deemed ready for discharge home. All follow up appointments have been scheduled. Discharge medications are listed below. _____________  Discharge Vitals Blood pressure 139/79, pulse 74, temperature 97.8 F (36.6 C), temperature source Oral, resp. rate 17, height 5\' 10"  (1.778 m), weight 106.2 kg, SpO2 94 %.  Filed Weights   02/22/18 0945 02/23/18 0424  Weight: 99.8 kg 106.2 kg   Physical Exam  Constitutional: He is oriented to person, place, and time. He appears well-developed and well-nourished. No distress.  HENT:  Head: Normocephalic and atraumatic.  Neck: Normal range of motion. Neck supple. No JVD present.  Cardiovascular: Normal rate, regular rhythm, normal heart sounds and intact distal pulses. Exam reveals no gallop and no friction rub.  No murmur heard. Pulmonary/Chest: Effort normal. No respiratory distress. He has wheezes. He has no rales.  Faint end expiratory wheezes  Abdominal: Soft. Bowel sounds are normal. He exhibits no distension.  Musculoskeletal: Normal range of motion.        General: No deformity or edema.  Neurological: He is alert and oriented to person, place, and time.  Skin: Skin is warm and dry.  Psychiatric: He has a normal mood and affect. His behavior is normal. Judgment and thought content normal.  Vitals reviewed.  Right radial cath site is without hematoma or bleeding.  Labs & Radiologic Studies    CBC No results for input(s): WBC, NEUTROABS, HGB, HCT, MCV, PLT in the last 72 hours. Basic Metabolic Panel Recent Labs    28/76/81 0439  NA 138  K 4.1  CL 98  CO2 30  GLUCOSE 111*  BUN 16  CREATININE 1.26*  CALCIUM 8.6*   Liver Function Tests No results for input(s): AST, ALT, ALKPHOS, BILITOT, PROT, ALBUMIN in the last 72 hours. No results for input(s): LIPASE, AMYLASE in the last 72 hours. Cardiac Enzymes No results for input(s): CKTOTAL, CKMB, CKMBINDEX,  TROPONINI in the last 72 hours. BNP Invalid input(s): POCBNP D-Dimer No results for input(s): DDIMER in the last 72 hours. Hemoglobin A1C No results for input(s): HGBA1C in the last 72 hours. Fasting Lipid Panel No results for input(s): CHOL, HDL, LDLCALC, TRIG, CHOLHDL, LDLDIRECT in the last 72 hours. Thyroid Function Tests No results for input(s): TSH, T4TOTAL, T3FREE, THYROIDAB in the last 72 hours.  Invalid input(s): FREET3 _____________  No results found. Disposition   Pt is being discharged home today in good condition.  Follow-up Plans & Appointments    Follow-up Information    Regan Lemming, MD Follow up.   Specialty:  Cardiology Why:  You will be called on Monday to schedule a 1 week cardiology follow-up.  If you are not called by Tuesday, please call our office to arrange.  He will have labs done on that day, you do not need to be fasting. Contact information: [address] [place] [address] [phone]          Discharge Instructions    Diet - low sodium heart healthy   Complete by:  As directed    Discharge instructions   Complete by:  As directed    PLEASE REMEMBER TO BRING ALL OF YOUR MEDICATIONS TO  EACH OF YOUR FOLLOW-UP OFFICE VISITS.  PLEASE ATTEND ALL SCHEDULED FOLLOW-UP APPOINTMENTS.   Activity: Increase activity slowly as tolerated. You may shower, but no soaking baths (or swimming) for 1 week. No driving for 24 hours. No lifting over 5 lbs for 1 week. No sexual activity for 1 week.   You May Return to Work: To be determined at follow up appointment.   Wound Care: You may wash cath site gently with soap and water. Keep cath site clean and dry. If you notice pain, swelling, bleeding or pus at your cath site, please call [phone].   Increase activity slowly   Complete by:  As directed       Discharge Medications   Allergies as of 02/23/2018   No Known Allergies     Medication List    STOP taking these  medications   aspirin 81 MG chewable tablet     TAKE these medications   BEVESPI AEROSPHERE 9-4.8 MCG/ACT Aero Generic drug:  Glycopyrrolate-Formoterol Inhale 1 puff into the lungs daily as needed (respiratory issues.).   dofetilide 500 MCG capsule Commonly known as:  TIKOSYN TAKE 1 CAPSULE BY MOUTH 2 TIMES DAILY. What changed:  See the new instructions.   furosemide 40 MG tablet Commonly known as:  LASIX Take 1 tablet (40 mg total) by mouth daily.   magnesium oxide 400 (241.3 Mg) MG tablet Commonly known as:  MAG-OX Take 1 tablet (400 mg total) by mouth daily.   metoprolol tartrate 50 MG tablet Commonly known as:  LOPRESSOR TAKE 1 AND 1/2 TABLETS BY MOUTH 2 TIMES DAILY STOP CARVEDILOL What changed:  See the new instructions.   rosuvastatin 40 MG tablet Commonly known as:  CRESTOR Take 1 tablet (40 mg total) by mouth daily.        Acute coronary syndrome (MI, NSTEMI, STEMI, etc) this admission?: No.    Outstanding Labs/Studies   BMet at follow up Lipid panel in 4-6 weeks  Duration of Discharge Encounter   Greater than 30 minutes including physician time.  Signed, Berton BonJanine Trajan Grove, NP 02/23/2018, 8:21 AM

## 2018-02-25 ENCOUNTER — Encounter (HOSPITAL_COMMUNITY): Payer: Self-pay | Admitting: Internal Medicine

## 2018-03-26 ENCOUNTER — Encounter: Payer: Self-pay | Admitting: Physician Assistant

## 2018-03-26 NOTE — Progress Notes (Deleted)
Cardiology Office Note    Date:  03/26/2018  ID:  [name] [name], DOB 07-May-1960, MRN [id number] PCP:  Dois Davenport, MD  Cardiologist:  Regan Lemming, MD -> now Dr. Elberta Fortis   Chief Complaint: f/u heart cath, shortness of breath  History of Present Illness:  [name] [name] is a 58 y.o. male with history of GERD, COPD, obesity, persistent atrial fibrillation, OSA who presents for post-cath follow-up. He was previously followed by Dr. Donnie Aho. He has since been established with Dr. Elberta Fortis for management of his afib and is now on Tikosyn after prior failed cardioversion attempts. He had prior coronary CT 06/2017 with minimal nonobstructive CAD. At visit on 02/08/18, he complained of worsening DOE. He had previously been diagnosed with OSA but had picked up his CPAP machine as instructed. Labs 02/08/18 showed normal CBC, LDL 111, K 4.7, Cr 0.98. [facility] 02/22/18 showed mild nonobstructive CAD with 30% mLAD with possible myocardial bridging and otherwise mild luminal irregularities. He had moderately to severely elevated left heart, right heart, and pulmonary artery pressures. Hemodynamics suggest restrictive physiology. There was significant respiratory variation noted in intracardiac pressures, which can be seen with obstructive sleep apnea. He had low normal to mildly reduced Fick cardiac output/index. Dr. Okey Dupre recommended diuresis and so Lasix 40mg  daily was started and also recommended compliance with OSA therapy.  no prior echo on file  cath site bmet, mg crestor increased 02/2018 - repeat CMET/lip   R/LHC was arranged  Dyspnea in setting of chronic diastolic CHF Mild CAD Persistent afib OSA   Past Medical History:  Diagnosis Date  . Chronic diastolic CHF (congestive heart failure) (HCC)   . COPD (chronic obstructive pulmonary disease) (HCC)   . Hypertension   . Obesity (BMI 30-39.9) 12/15/2016  . OSA (obstructive sleep apnea)   . Persistent atrial fibrillation 12/15/2016  .  Visit for monitoring Tikosyn therapy     Past Surgical History:  Procedure Laterality Date  . CARDIAC CATHETERIZATION  02/22/2018  . CARDIOVERSION N/A 12/22/2016   Procedure: CARDIOVERSION;  Surgeon: Othella Boyer, MD;  [facility] END[facility]COPY;  Service: Cardiovascular;  Laterality: N/A;  . HEMORROIDECTOMY    . HERNIA REPAIR    . RIGHT/LEFT HEART CATH AND CORONARY ANGIOGRAPHY N/A 02/22/2018   Procedure: RIGHT/LEFT HEART CATH AND CORONARY ANGIOGRAPHY;  Surgeon: Yvonne Kendall, MD;  Location: [facility] INVASIVE CV LAB;  Service: Cardiovascular;  Laterality: N/A;    Current Medications: No outpatient medications have been marked as taking for the 03/27/18 encounter (Appointment) with Laurann Montana, PA-C.   ***   Allergies:   Patient has no known allergies.   Social History   Socioeconomic History  . Marital status: Divorced    Spouse name: Not on file  . Number of children: Not on file  . Years of education: Not on file  . Highest education level: Not on file  Occupational History  . Not on file  Social Needs  . Financial resource strain: Not on file  . Food insecurity:    Worry: Not on file    Inability: Not on file  . Transportation needs:    Medical: Not on file    Non-medical: Not on file  Tobacco Use  . Smoking status: Former Smoker    Packs/day: 1.00    Years: 30.00    Pack years: 30.00  . Smokeless tobacco: Never Used  Substance and Sexual Activity  . Alcohol use: Yes    Alcohol/week: 4.0 standard drinks  Types: 4 Cans of beer per week  . Drug use: No  . Sexual activity: Not on file  Lifestyle  . Physical activity:    Days per week: Not on file    Minutes per session: Not on file  . Stress: Not on file  Relationships  . Social connections:    Talks on phone: Not on file    Gets together: Not on file    Attends religious service: Not on file    Active member of club or organization: Not on file    Attends meetings of clubs or organizations: Not on file      Relationship status: Not on file  Other Topics Concern  . Not on file  Social History Narrative  . Not on file     Family History:  The patient's ***family history includes Atrial fibrillation in his brother; CAD in his brother; COPD in his father and mother.  ROS:   Please see the history of present illness. Otherwise, review of systems is positive for ***.  All other systems are reviewed and otherwise negative.    PHYSICAL EXAM:   VS:  There were no vitals taken for this visit.  BMI: There is no height or weight on file to calculate BMI. GEN: Well nourished, well developed, in no acute distress HEENT: normocephalic, atraumatic Neck: no JVD, carotid bruits, or masses Cardiac: ***RRR; no murmurs, rubs, or gallops, no edema  Respiratory:  clear to auscultation bilaterally, normal work of breathing GI: soft, nontender, nondistended, + BS MS: no deformity or atrophy Skin: warm and dry, no rash Neuro:  Alert and Oriented x 3, Strength and sensation are intact, follows commands Psych: euthymic mood, full affect  Wt Readings from Last 3 Encounters:  02/23/18 234 lb 2.1 oz (106.2 kg)  02/08/18 239 lb 9.6 oz (108.7 kg)  08/07/17 228 lb 3.2 oz (103.5 kg)      Studies/Labs Reviewed:   EKG:  EKG was ordered today and personally reviewed by me and demonstrates *** EKG was not ordered today.***  Recent Labs: 02/08/2018: Hemoglobin 15.2; Magnesium 2.1; Platelets 251 02/23/2018: BUN 16; Creatinine, Ser 1.26; Potassium 4.1; Sodium 138   Lipid Panel    Component Value Date/Time   CHOL 183 02/08/2018 1319   TRIG 125 02/08/2018 1319   HDL 47 02/08/2018 1319   CHOLHDL 3.9 02/08/2018 1319   LDLCALC 111 (H) 02/08/2018 1319    Additional studies/ records that were reviewed today include: Summarized above.***    ASSESSMENT & PLAN:   1. ***  Disposition: F/u with ***   Medication Adjustments/Labs and Tests Ordered: Current medicines are reviewed at length with the patient  today.  Concerns regarding medicines are outlined above. Medication changes, Labs and Tests ordered today are summarized above and listed in the Patient Instructions accessible in Encounters.   Signed, Laurann Montana, PA-C  03/26/2018 3:10 PM    [facility] [address] [place], [place], [place]  [address] Phone: [phone]; Fax: [phone]

## 2018-03-27 ENCOUNTER — Ambulatory Visit: Payer: Medicaid Other | Admitting: Physician Assistant

## 2018-03-28 ENCOUNTER — Encounter: Payer: Self-pay | Admitting: Physician Assistant

## 2018-06-24 ENCOUNTER — Emergency Department (HOSPITAL_COMMUNITY)
Admission: EM | Admit: 2018-06-24 | Discharge: 2018-06-25 | Disposition: A | Discharge status: Home / Self Care | Payer: Medicaid Other | Attending: Emergency Medicine | Admitting: Emergency Medicine

## 2018-06-24 ENCOUNTER — Other Ambulatory Visit: Payer: Self-pay

## 2018-06-24 DIAGNOSIS — Z87891 Personal history of nicotine dependence: Secondary | ICD-10-CM | POA: Diagnosis not present

## 2018-06-24 DIAGNOSIS — S7012XA Contusion of left thigh, initial encounter: Secondary | ICD-10-CM | POA: Diagnosis not present

## 2018-06-24 DIAGNOSIS — Y939 Activity, unspecified: Secondary | ICD-10-CM | POA: Diagnosis not present

## 2018-06-24 DIAGNOSIS — J449 Chronic obstructive pulmonary disease, unspecified: Secondary | ICD-10-CM | POA: Diagnosis not present

## 2018-06-24 DIAGNOSIS — I110 Hypertensive heart disease with heart failure: Secondary | ICD-10-CM | POA: Insufficient documentation

## 2018-06-24 DIAGNOSIS — Y929 Unspecified place or not applicable: Secondary | ICD-10-CM | POA: Diagnosis not present

## 2018-06-24 DIAGNOSIS — W19XXXA Unspecified fall, initial encounter: Secondary | ICD-10-CM

## 2018-06-24 DIAGNOSIS — Y999 Unspecified external cause status: Secondary | ICD-10-CM | POA: Diagnosis not present

## 2018-06-24 DIAGNOSIS — W11XXXA Fall on and from ladder, initial encounter: Secondary | ICD-10-CM | POA: Insufficient documentation

## 2018-06-24 DIAGNOSIS — M546 Pain in thoracic spine: Secondary | ICD-10-CM | POA: Diagnosis not present

## 2018-06-24 DIAGNOSIS — I5032 Chronic diastolic (congestive) heart failure: Secondary | ICD-10-CM | POA: Insufficient documentation

## 2018-06-24 DIAGNOSIS — S8992XA Unspecified injury of left lower leg, initial encounter: Secondary | ICD-10-CM | POA: Diagnosis present

## 2018-06-24 DIAGNOSIS — Z79899 Other long term (current) drug therapy: Secondary | ICD-10-CM | POA: Diagnosis not present

## 2018-06-24 DIAGNOSIS — R1084 Generalized abdominal pain: Secondary | ICD-10-CM | POA: Diagnosis not present

## 2018-06-24 NOTE — ED Notes (Signed)
C-collar placed on patient.

## 2018-06-24 NOTE — ED Triage Notes (Signed)
Patient also c/o bilateral leg swelling.

## 2018-06-24 NOTE — ED Triage Notes (Signed)
Pt to ED via [facility] with c/o abd pain.  Pt st's he fell off a 52ft ladder yesterday.  Pt st's he did not have any pain at time of fall.  Pt st's he started having mid abd pain this am also neck and back pain

## 2018-06-25 ENCOUNTER — Emergency Department (HOSPITAL_COMMUNITY): Payer: Medicaid Other

## 2018-06-25 LAB — COMPREHENSIVE METABOLIC PANEL
ALT: 75 U/L — ABNORMAL HIGH (ref 0–44)
AST: 86 U/L — ABNORMAL HIGH (ref 15–41)
Albumin: 4.3 g/dL (ref 3.5–5.0)
Alkaline Phosphatase: 52 U/L (ref 38–126)
Anion gap: 12 (ref 5–15)
BUN: 9 mg/dL (ref 6–20)
CO2: 29 mmol/L (ref 22–32)
Calcium: 9 mg/dL (ref 8.9–10.3)
Chloride: 98 mmol/L (ref 98–111)
Creatinine, Ser: 1.16 mg/dL (ref 0.61–1.24)
GFR calc Af Amer: >60 mL/min (ref >60)
GFR calc non Af Amer: >60 mL/min (ref >60)
Glucose, Bld: 117 mg/dL — ABNORMAL HIGH (ref 70–99)
Potassium: 3.8 mmol/L (ref 3.5–5.1)
Sodium: 139 mmol/L (ref 135–145)
Total Bilirubin: 1.2 mg/dL (ref 0.3–1.2)
Total Protein: 8.2 g/dL — ABNORMAL HIGH (ref 6.5–8.1)

## 2018-06-25 LAB — CBC
HCT: 46.7 % (ref 39.0–52.0)
Hemoglobin: 15.9 g/dL (ref 13.0–17.0)
MCH: 30.6 pg (ref 26.0–34.0)
MCHC: 34 g/dL (ref 30.0–36.0)
MCV: 89.8 fL (ref 80.0–100.0)
Platelets: 258 10*3/uL (ref 150–400)
RBC: 5.2 MIL/uL (ref 4.22–5.81)
RDW: 12.7 % (ref 11.5–15.5)
WBC: 7.9 10*3/uL (ref 4.0–10.5)
nRBC: 0 % (ref 0.0–0.2)

## 2018-06-25 MED ORDER — ONDANSETRON 4 MG PO TBDP: 4.0000 mg | ORAL_TABLET | Freq: Three times a day (TID) | ORAL | 0 refills | Status: DC | PRN

## 2018-06-25 MED ORDER — ONDANSETRON HCL 4 MG/2ML IJ SOLN
4.0000 mg | Freq: Once | INTRAMUSCULAR | Status: AC
  Administered 2018-06-25: 4 mg via INTRAVENOUS
  Filled 2018-06-25: qty 2

## 2018-06-25 MED ORDER — MORPHINE SULFATE (PF) 4 MG/ML IV SOLN
4.0000 mg | Freq: Once | INTRAVENOUS | Status: AC
  Administered 2018-06-25: 4 mg via INTRAVENOUS
  Filled 2018-06-25: qty 1

## 2018-06-25 MED ORDER — IOHEXOL 300 MG/ML  SOLN
125.0000 mL | Freq: Once | INTRAMUSCULAR | Status: AC | PRN
  Administered 2018-06-25: 125 mL via INTRAVENOUS

## 2018-06-25 NOTE — ED Notes (Signed)
Patient currently at CT scan .  

## 2018-06-25 NOTE — ED Notes (Signed)
Patient states that he will not wear the c-collar. Strongly encouraged patient to keep collar on, but pt refused.

## 2018-06-25 NOTE — Discharge Instructions (Addendum)
You were seen today after a fall and developing abdominal pain, back pain.  Your work-up is reassuring including CT scans that show no evidence of intrathoracic or intra-abdominal injury.  Take Tylenol as needed for pain.  You will be given a prescription for Zofran for nausea.

## 2018-06-25 NOTE — ED Provider Notes (Signed)
[facility] EMERGENCY DEPARTMENT Provider Note   CSN: [id number] Arrival date & time: 06/24/18  2338    History   Chief Complaint Chief Complaint  Patient presents with  . Fall    HPI [name] [name] is a 58 y.o. male.     HPI  This is a 58 year old male with a history of atrial fibrillation, heart failure, COPD, hypertension he did hit his head.  No loss of consciousness.  He is not on any anticoagulants.  Since that time he has noticed increasing central abdominal pain that is nonradiating.  It is dull.  Rates his pain 8 out of 10.  He is reported nausea without vomiting.  Normal bowel movements.  He is also noted mid and lower back pain and lower extremity swelling that is new since the fall.  He denies any difficulty with his bowel or bladder.  He has been ambulatory.  He noted a bruise to his right leg.  Patient has not taken anything for his symptoms.  Nothing seems to make his abdominal pain better or worse.  In addition, he reports some shortness of breath.  No chest pain.  Past Medical History:  Diagnosis Date  . Chronic diastolic CHF (congestive heart failure) (HCC)   . COPD (chronic obstructive pulmonary disease) (HCC)   . Hypertension   . Obesity (BMI 30-39.9) 12/15/2016  . OSA (obstructive sleep apnea)   . Persistent atrial fibrillation 12/15/2016  . Visit for monitoring Tikosyn therapy     Patient Active Problem List   Diagnosis Date Noted  . Angina pectoris (HCC) 02/22/2018  . Shortness of breath 02/22/2018  . (HFpEF) heart failure with preserved ejection fraction (HCC) 02/22/2018  . Hypertensive heart disease without CHF 03/22/2017  . Persistent atrial fibrillation 12/15/2016  . Obesity (BMI 30-39.9) 12/15/2016  . Long term current use of anticoagulant therapy 12/15/2016    Past Surgical History:  Procedure Laterality Date  . CARDIAC CATHETERIZATION  02/22/2018  . CARDIOVERSION N/A 12/22/2016   Procedure: CARDIOVERSION;  Surgeon:  Othella Boyer, MD;  Location: [facility] ENDOSCOPY;  Service: Cardiovascular;  Laterality: N/A;  . HEMORROIDECTOMY    . HERNIA REPAIR    . RIGHT/LEFT HEART CATH AND CORONARY ANGIOGRAPHY N/A 02/22/2018   Procedure: RIGHT/LEFT HEART CATH AND CORONARY ANGIOGRAPHY;  Surgeon: Yvonne Kendall, MD;  Location: [facility] INVASIVE CV LAB;  Service: Cardiovascular;  Laterality: N/A;        Home Medications    Prior to Admission medications   Medication Sig Start Date End Date Taking? Authorizing Provider  dofetilide (TIKOSYN) 500 MCG capsule TAKE 1 CAPSULE BY MOUTH 2 TIMES DAILY. Patient taking differently: Take 500 mcg by mouth 2 (two) times daily.  10/25/17   Sheilah Pigeon, PA-C  furosemide (LASIX) 40 MG tablet Take 1 tablet (40 mg total) by mouth daily. 02/23/18   Berton Bon, NP  Glycopyrrolate-Formoterol (BEVESPI AEROSPHERE) 9-4.8 MCG/ACT AERO Inhale 1 puff into the lungs daily as needed (respiratory issues.).     [provider]  magnesium oxide (MAG-OX) 400 (241.3 Mg) MG tablet Take 1 tablet (400 mg total) by mouth daily. Patient not taking: Reported on 02/19/2018 03/23/17   Sheilah Pigeon, PA-C  metoprolol tartrate (LOPRESSOR) 50 MG tablet TAKE 1 AND 1/2 TABLETS BY MOUTH 2 TIMES DAILY STOP CARVEDILOL Patient taking differently: Take 75 mg by mouth 2 (two) times daily.  11/26/17   Camnitz, Will Daphine Deutscher, MD  ondansetron (ZOFRAN ODT) 4 MG disintegrating tablet Take 1 tablet (4  mg total) by mouth every 8 (eight) hours as needed for nausea or vomiting. 06/25/18   Denya Buckingham, Mayer Maskerourtney F, MD  rosuvastatin (CRESTOR) 40 MG tablet Take 1 tablet (40 mg total) by mouth daily. 02/13/18 05/14/18  Regan Lemmingamnitz, Will Martin, MD    Family History Family History  Problem Relation Age of Onset  . COPD Mother   . COPD Father   . Atrial fibrillation Brother   . CAD Brother     Social History Social History   Tobacco Use  . Smoking status: Former Smoker    Packs/day: 1.00    Years: 30.00    Pack years: 30.00   . Smokeless tobacco: Never Used  Substance Use Topics  . Alcohol use: Yes    Alcohol/week: 4.0 standard drinks    Types: 4 Cans of beer per week  . Drug use: No     Allergies   Patient has no known allergies.   Review of Systems Review of Systems  Constitutional: Negative for fever.  Respiratory: Positive for shortness of breath.   Cardiovascular: Positive for leg swelling. Negative for chest pain.  Gastrointestinal: Positive for abdominal pain and nausea. Negative for constipation, diarrhea and vomiting.  Genitourinary: Negative for difficulty urinating and dysuria.  Musculoskeletal: Positive for back pain and neck pain.  Skin: Positive for wound.  Neurological: Negative for dizziness, weakness and headaches.  All other systems reviewed and are negative.    Physical Exam Updated Vital Signs BP 116/80 (BP Location: Right Arm)   Pulse 87   Temp (!) 97.5 F (36.4 C) (Oral)   Resp 16   Ht 1.753 m (5\' 9" )   Wt 104.3 kg   SpO2 97%   BMI 33.97 kg/m   Physical Exam Vitals signs and nursing note reviewed.  Constitutional:      Appearance: He is well-developed. He is obese.  HENT:     Head: Normocephalic and atraumatic.     Mouth/Throat:     Mouth: Mucous membranes are moist.  Eyes:     Pupils: Pupils are equal, round, and reactive to light.  Neck:     Musculoskeletal: Normal range of motion and neck supple.     Comments: Tenderness to palpation lower C-spine, no step-off or deformity Cardiovascular:     Rate and Rhythm: Normal rate and regular rhythm.     Heart sounds: Normal heart sounds. No murmur.  Pulmonary:     Effort: Pulmonary effort is normal. No respiratory distress.     Breath sounds: Normal breath sounds. No wheezing.  Abdominal:     General: Bowel sounds are normal. There is distension.     Palpations: Abdomen is soft.     Tenderness: There is abdominal tenderness. There is no rebound.     Comments: Mid to the left upper quadrant tenderness to  palpation, no rebound or guarding, no ecchymosis noted  Musculoskeletal:     Comments: 1+ bilateral lower extremity edema Tenderness to palpation mid thoracic and lumbar spine, no step-off or deformity  Lymphadenopathy:     Cervical: No cervical adenopathy.  Skin:    General: Skin is warm and dry.     Comments: Bruising left inner thigh  Neurological:     Mental Status: He is alert and oriented to person, place, and time.     Comments: 5 out of 5 strength in all 4 extremities  Psychiatric:        Mood and Affect: Mood normal.      ED Treatments /  Results  Labs (all labs ordered are listed, but only abnormal results are displayed) Labs Reviewed  COMPREHENSIVE METABOLIC PANEL - Abnormal; Notable for the following components:      Result Value   Glucose, Bld 117 (*)    Total Protein 8.2 (*)    AST 86 (*)    ALT 75 (*)    All other components within normal limits  CBC  URINALYSIS, ROUTINE W REFLEX MICROSCOPIC    EKG None  Radiology Dg Chest 2 View  Result Date: 06/25/2018 CLINICAL DATA:  58 year old male status post fall from 8 ft ladder yesterday. EXAM: CHEST - 2 VIEW COMPARISON:  Coronary CTA 06/27/2017. FINDINGS: Large lung volumes. Normal cardiac size and mediastinal contours. Visualized tracheal air column is within normal limits. No pneumothorax, pulmonary edema, pleural effusion. Mild chronic scarring in the lingula appears stable. No pulmonary contusion identified. Midthoracic vertebrae appear stable. No acute osseous abnormality identified. Negative visible bowel gas pattern. IMPRESSION: Pulmonary hyperinflation. No acute cardiopulmonary abnormality or acute traumatic injury identified. Electronically Signed   By: Odessa Fleming M.D.   On: 06/25/2018 03:18   Dg Cervical Spine Complete  Result Date: 06/25/2018 CLINICAL DATA:  58 year old male status post fall from 8 ft ladder yesterday. EXAM: CERVICAL SPINE - COMPLETE 4+ VIEW COMPARISON:  None. FINDINGS: Normal prevertebral  soft tissue contour. Preserved cervical lordosis. Bilateral posterior element alignment is within normal limits. Cervicothoracic junction alignment is within normal limits. Normal C1-C2 alignment and joint spaces. Normal AP alignment. Disc space loss and endplate spurring at C5-C6. Negative visible upper chest. No acute osseous abnormality identified. IMPRESSION: 1. No acute osseous abnormality identified in the cervical spine. 2. Chronic disc and endplate degeneration at C5-C6. Electronically Signed   By: Odessa Fleming M.D.   On: 06/25/2018 03:19   Ct Chest W Contrast  Result Date: 06/25/2018 CLINICAL DATA:  Blunt chest trauma. Back and abdominal pain after fall from ladder EXAM: CT CHEST, ABDOMEN, AND PELVIS WITH CONTRAST TECHNIQUE: Multidetector CT imaging of the chest, abdomen and pelvis was performed following the standard protocol during bolus administration of intravenous contrast. CONTRAST:  OMNIPAQUE IOHEXOL 300 MG/ML  SOLN COMPARISON:  None. FINDINGS: CT CHEST FINDINGS Cardiovascular: Normal heart size. No pericardial effusion. No acute vascular finding. Atherosclerotic plaque of the aorta. Mediastinum/Nodes: Negative for pneumomediastinum or hematoma. No adenopathy Lungs/Pleura: No hemothorax or pneumothorax. There is centrilobular and paraseptal emphysema. Generalized airway thickening with cylindrical bronchiectasis at both bases. Musculoskeletal: Negative for fracture or subluxation CT ABDOMEN PELVIS FINDINGS Hepatobiliary: No hepatic injury or perihepatic hematoma. Gallbladder is unremarkable. Hepatic steatosis Pancreas: Negative Spleen: Negative Adrenals/Urinary Tract: No adrenal hemorrhage or renal injury identified. Bladder is unremarkable. Stomach/Bowel: No evidence of bowel injury. Suspect prior internal hemorrhoid surgery. Extensive sigmoid diverticulosis. Vascular/Lymphatic: No acute vascular finding. Generalized atherosclerotic plaque on the aorta. Reproductive: Negative Other: No ascites  or pneumoperitoneum Musculoskeletal: Negative for fracture or subluxation. IMPRESSION: 1. No evidence of injury to the chest or abdomen. 2. Incidental findings include emphysema, aortic atherosclerosis, hepatic steatosis, and cylindrical bronchiectasis. Electronically Signed   By: Marnee Spring M.D.   On: 06/25/2018 04:22   Ct Abdomen Pelvis W Contrast  Result Date: 06/25/2018 CLINICAL DATA:  Blunt chest trauma. Back and abdominal pain after fall from ladder EXAM: CT CHEST, ABDOMEN, AND PELVIS WITH CONTRAST TECHNIQUE: Multidetector CT imaging of the chest, abdomen and pelvis was performed following the standard protocol during bolus administration of intravenous contrast. CONTRAST:  OMNIPAQUE IOHEXOL 300 MG/ML  SOLN COMPARISON:  None. FINDINGS: CT CHEST FINDINGS Cardiovascular: Normal heart size. No pericardial effusion. No acute vascular finding. Atherosclerotic plaque of the aorta. Mediastinum/Nodes: Negative for pneumomediastinum or hematoma. No adenopathy Lungs/Pleura: No hemothorax or pneumothorax. There is centrilobular and paraseptal emphysema. Generalized airway thickening with cylindrical bronchiectasis at both bases. Musculoskeletal: Negative for fracture or subluxation CT ABDOMEN PELVIS FINDINGS Hepatobiliary: No hepatic injury or perihepatic hematoma. Gallbladder is unremarkable. Hepatic steatosis Pancreas: Negative Spleen: Negative Adrenals/Urinary Tract: No adrenal hemorrhage or renal injury identified. Bladder is unremarkable. Stomach/Bowel: No evidence of bowel injury. Suspect prior internal hemorrhoid surgery. Extensive sigmoid diverticulosis. Vascular/Lymphatic: No acute vascular finding. Generalized atherosclerotic plaque on the aorta. Reproductive: Negative Other: No ascites or pneumoperitoneum Musculoskeletal: Negative for fracture or subluxation. IMPRESSION: 1. No evidence of injury to the chest or abdomen. 2. Incidental findings include emphysema, aortic atherosclerosis, hepatic  steatosis, and cylindrical bronchiectasis. Electronically Signed   By: Marnee Spring M.D.   On: 06/25/2018 04:22    Procedures Procedures (including critical care time)  Medications Ordered in ED Medications  morphine 4 MG/ML injection 4 mg (4 mg Intravenous Given 06/25/18 0236)  ondansetron (ZOFRAN) injection 4 mg (4 mg Intravenous Given 06/25/18 0236)  iohexol (OMNIPAQUE) 300 MG/ML solution 125 mL (125 mLs Intravenous Contrast Given 06/25/18 0351)     Initial Impression / Assessment and Plan / ED Course  I have reviewed the triage vital signs and the nursing notes.  Pertinent labs & imaging results that were available during my care of the patient were reviewed by me and considered in my medical decision making (see chart for details).        Patient presents with abdominal pain following a fall.  He is overall nontoxic-appearing and vital signs are reassuring.  ABCs are intact.  The only obvious injury after the fall is a contusion to the left medial thigh.  He also has some midline tenderness to palpation of the thoracic and lumbar spine without step-off or deformity.  Diffuse tenderness to palpation without rebound or guarding.  No bruising noted over the abdomen.  Patient has multiple complaints after his fall.  He is not on blood thinners.  Work-up initiated including appropriate imaging studies.  Patient was given morphine and Zofran.  Work-up is largely unremarkable including CT chest, abdomen, pelvis and plain films of the C-spine.  On recheck, he continues to endorse some abdominal bloating but his abdominal exam is benign.  Will treat supportively.  After history, exam, and medical workup I feel the patient has been appropriately medically screened and is safe for discharge home. Pertinent diagnoses were discussed with the patient. Patient was given return precautions.   Final Clinical Impressions(s) / ED Diagnoses   Final diagnoses:  Fall, initial encounter  Generalized  abdominal pain  Contusion of left thigh, initial encounter  Acute midline thoracic back pain    ED Discharge Orders         Ordered    ondansetron (ZOFRAN ODT) 4 MG disintegrating tablet  Every 8 hours PRN     06/25/18 0443           Shon Baton, MD 06/25/18 [phone]

## 2018-06-25 NOTE — ED Notes (Signed)
Patient transported to X-ray 

## 2018-08-24 ENCOUNTER — Other Ambulatory Visit: Payer: Self-pay | Admitting: Cardiology

## 2018-10-25 ENCOUNTER — Other Ambulatory Visit: Payer: Self-pay | Admitting: Physician Assistant

## 2018-10-25 ENCOUNTER — Other Ambulatory Visit: Payer: Self-pay | Admitting: Cardiology

## 2018-11-26 ENCOUNTER — Other Ambulatory Visit: Payer: Self-pay | Admitting: Cardiology

## 2018-11-26 ENCOUNTER — Telehealth: Payer: Self-pay | Admitting: Cardiology

## 2018-11-26 MED ORDER — METOPROLOL TARTRATE 50 MG PO TABS: 75.0000 mg | ORAL_TABLET | Freq: Two times a day (BID) | ORAL | 0 refills | Status: DC

## 2018-11-26 MED ORDER — DOFETILIDE 500 MCG PO CAPS: ORAL_CAPSULE | ORAL | 0 refills | Status: DC

## 2018-11-26 NOTE — Telephone Encounter (Signed)
°*  STAT* If patient is at the pharmacy, call can be transferred to refill team.   1. Which medications need to be refilled? (please list name of each medication and dose if known)  Metoprolol and Dofetilide- pt has appt in November  2. Which pharmacy/location (including street and city if local pharmacy) is medication to be sent to? [place], [place]  3. Do they need a 30 day or 90 day supply? 30 days and refills

## 2018-11-26 NOTE — Telephone Encounter (Signed)
**Note De-Identified Devaughn Savant Obfuscation** Metoprolol and Dofetilide refills sent to [place] as requested.

## 2018-12-08 NOTE — Progress Notes (Deleted)
Cardiology Office Note Date:  12/08/2018  Patient ID:  [name] [name], [name] [name] [birth year], MRN [id number] PCP:  Hayden Rasmussen, MD  Cardiologist:  previously Dr. Wynonia Lawman Electrophysiologist: Dr. Curt Bears  ***refresh   Chief Complaint: *** routine f/u  History of Present Illness: [name] [name] is a 59 y.o. male with history of COD, obesity, GERD, persistent Afib.  He comes in today to be seen for Dr. Curt Bears.  Last seen by him Jan 2020.  At that visit note he had undergone sleep testing and found with sleep apnea, though had not yet picked up his CPAP machine.[name]  He mentioned a few weeks of chest fullness and DOE. He was referred for LHC,   Conclusions: 1. Mild, non-obstructive coronary artery disease. 2. Moderately to severely elevated left heart, right heart, and pulmonary artery pressures.  Hemodynamics suggest restrictive physiology. 3. Significant respiratory variation noted in intracardiac pressures, which can be seen with obstructive sleep apnea. 4. Low normal to mildly reduced Fick cardiac output/index. Recommendations: 1. Medical therapy to prevent progression of coronary artery disease. 2. Diuresis and further evaluation/treatment for suspected obstructive sleep apnea. 3. Consider transthoracic echocardiogram. 4. If symptoms persist, advanced heart failure consultation may be helpful. 5. Overnight extended recovery, as patient does not have anyone to stay with him tonight.  Likely discharge in the morning   *** needs a new echo *** referral to HF clinic *** [facility] is one *** labs, lipids,... *** CPAP ??? *** volume status, diastolic *** Tikosyn ekg   Afib Diagnosed 2018 [facility] Tikosyn 03/2017 >> current   Past Medical History:  Diagnosis Date  . Chronic diastolic CHF (congestive heart failure) ([place])   . COPD (chronic obstructive pulmonary disease) ([place])   . Hypertension   . Obesity (BMI 30-39.9) 12/15/2016  . OSA (obstructive sleep apnea)   . Persistent  atrial fibrillation 12/15/2016  . Visit for monitoring Tikosyn therapy     Past Surgical History:  Procedure Laterality Date  . CARDIAC CATHETERIZATION  02/22/2018  . CARDIOVERSION N/A 12/22/2016   Procedure: CARDIOVERSION;  Surgeon: Jacolyn Reedy, MD;  Location: [facility] ENDOSCOPY;  Service: Cardiovascular;  Laterality: N/A;  . HEMORROIDECTOMY    . HERNIA REPAIR    . RIGHT/LEFT HEART CATH AND CORONARY ANGIOGRAPHY N/A 02/22/2018   Procedure: RIGHT/LEFT HEART CATH AND CORONARY ANGIOGRAPHY;  Surgeon: Nelva Bush, MD;  Location: [place] CV LAB;  Service: Cardiovascular;  Laterality: N/A;    Current Outpatient Medications  Medication Sig Dispense Refill  . dofetilide (TIKOSYN) 500 MCG capsule TAKE 1 CAPSULE BY MOUTH 2 TIMES DAILY. Please keep upcoming appt in November for future refills. Thank you 60 capsule 0  . furosemide (LASIX) 40 MG tablet TAKE 1 TABLET (40 MG TOTAL) BY MOUTH DAILY. 30 tablet 5  . Glycopyrrolate-Formoterol (BEVESPI AEROSPHERE) 9-4.8 MCG/ACT AERO Inhale 1 puff into the lungs daily as needed (respiratory issues.).     [name] magnesium oxide (MAG-OX) 400 (241.3 Mg) MG tablet Take 1 tablet (400 mg total) by mouth daily. (Patient not taking: Reported on 02/19/2018) 30 tablet 6  . metoprolol tartrate (LOPRESSOR) 50 MG tablet Take 1.5 tablets (75 mg total) by mouth 2 (two) times daily. Please keep upcoming appt in November for future refills. Thank you 90 tablet 0  . ondansetron (ZOFRAN ODT) 4 MG disintegrating tablet Take 1 tablet (4 mg total) by mouth every 8 (eight) hours as needed for nausea or vomiting. 20 tablet 0  . rosuvastatin (CRESTOR) 40 MG tablet Take 1 tablet (40  mg total) by mouth daily. 90 tablet 3   No current facility-administered medications for this visit.     Allergies:   Patient has no known allergies.   Social History:  The patient  reports that he has quit smoking. He has a 30.00 pack-year smoking history. He has never used smokeless tobacco. He reports  current alcohol use of about 4.0 standard drinks of alcohol per week. He reports that he does not use drugs.   Family History:  The patient's family history includes Atrial fibrillation in his brother; CAD in his brother; COPD in his father and mother.  ROS:  Please see the history of present illness.  All other systems are reviewed and otherwise negative.   PHYSICAL EXAM: *** VS:  There were no vitals taken for this visit. BMI: There is no height or weight on file to calculate BMI. Well nourished, well developed, in no acute distress  HEENT: normocephalic, atraumatic  Neck: no JVD, carotid bruits or masses Cardiac:  *** RRR; no significant murmurs, no rubs, or gallops Lungs:  *** CTA b/l, no wheezing, rhonchi or rales  Abd: soft, nontender MS: no deformity or *** atrophy Ext: *** no edema  Skin: warm and dry, no rash Neuro:  No gross deficits appreciated Psych: euthymic mood, full affect    EKG:  Done today and reviewed by myself: ***  Recent Labs: 02/08/2018: Magnesium 2.1 06/25/2018: ALT 75; BUN 9; Creatinine, Ser 1.16; Hemoglobin 15.9; Platelets 258; Potassium 3.8; Sodium 139  02/08/2018: Chol/HDL Ratio 3.9; Cholesterol, Total 183; HDL 47; LDL Calculated 111; Triglycerides 125   CrCl cannot be calculated (Patient's most recent lab result is older than the maximum 21 days allowed.).   Wt Readings from Last 3 Encounters:  06/24/18 230 lb (104.3 kg)  02/23/18 234 lb 2.1 oz (106.2 kg)  02/08/18 239 lb 9.6 oz (108.7 kg)     Other studies reviewed: Additional studies/records reviewed today include: summarized above  ASSESSMENT AND PLAN:  1. Persistent AFib     CHA2DS2Vasc is one, not on a/c     *** Tikosyn   2. HTN     ***  3. Diastolic dysfunction/cath     ***  4. Mild non-obstructive CAD     *** lpids     Disposition: F/u with ***  Current medicines are reviewed at length with the patient today.  The patient did not have any concerns regarding medicines.***   Signed, Francis Dowse, PA-C 12/08/2018 5:56 PM     [facility] [address] [place] [address] [phone] (office)  [phone] (fax)

## 2018-12-10 ENCOUNTER — Ambulatory Visit: Payer: Medicaid Other | Admitting: Physician Assistant

## 2019-01-06 NOTE — Progress Notes (Deleted)
Cardiology Office Note Date:  01/06/2019  Patient ID:  [name], [name] Apr 18, 1960, MRN [id number] PCP:  Dois Davenport, MD  Cardiologist:  previously Dr. Donnie Aho Electrophysiologist: Dr. Elberta Fortis  ***refresh   Chief Complaint: *** routine f/u  History of Present Illness: [name] [name] is a 58 y.o. male with history of COD, obesity, GERD, persistent Afib.  He comes in today to be seen for Dr. Elberta Fortis.  Last seen by him Jan 2020.  At that visit note he had undergone sleep testing and found with sleep apnea, though had not yet picked up his CPAP machine.[name] [name]  He mentioned a few weeks of chest fullness and DOE. He was referred for LHC,   Conclusions: 1. Mild, non-obstructive coronary artery disease. 2. Moderately to severely elevated left heart, right heart, and pulmonary artery pressures.  Hemodynamics suggest restrictive physiology. 3. Significant respiratory variation noted in intracardiac pressures, which can be seen with obstructive sleep apnea. 4. Low normal to mildly reduced Fick cardiac output/index. Recommendations: 1. Medical therapy to prevent progression of coronary artery disease. 2. Diuresis and further evaluation/treatment for suspected obstructive sleep apnea. 3. Consider transthoracic echocardiogram. 4. If symptoms persist, advanced heart failure consultation may be helpful. 5. Overnight extended recovery, as patient does not have anyone to stay with him tonight.  Likely discharge in the morning   *** needs a new echo *** referral to HF clinic *** [facility] is one *** labs, lipids,... *** CPAP ??? *** volume status, diastolic *** Tikosyn ekg, med list   Afib Diagnosed 2018 AAD Tikosyn 03/2017 >> current   Past Medical History:  Diagnosis Date  . Chronic diastolic CHF (congestive heart failure) (HCC)   . COPD (chronic obstructive pulmonary disease) (HCC)   . Hypertension   . Obesity (BMI 30-39.9) 12/15/2016  . OSA (obstructive sleep apnea)   .  Persistent atrial fibrillation 12/15/2016  . Visit for monitoring Tikosyn therapy     Past Surgical History:  Procedure Laterality Date  . CARDIAC CATHETERIZATION  02/22/2018  . CARDIOVERSION N/A 12/22/2016   Procedure: CARDIOVERSION;  Surgeon: Othella Boyer, MD;  Location: [facility] ENDOSCOPY;  Service: Cardiovascular;  Laterality: N/A;  . HEMORROIDECTOMY    . HERNIA REPAIR    . RIGHT/LEFT HEART CATH AND CORONARY ANGIOGRAPHY N/A 02/22/2018   Procedure: RIGHT/LEFT HEART CATH AND CORONARY ANGIOGRAPHY;  Surgeon: Yvonne Kendall, MD;  Location: [facility] INVASIVE CV LAB;  Service: Cardiovascular;  Laterality: N/A;    Current Outpatient Medications  Medication Sig Dispense Refill  . dofetilide (TIKOSYN) 500 MCG capsule TAKE 1 CAPSULE BY MOUTH 2 TIMES DAILY. Please keep upcoming appt in November for future refills. Thank you 60 capsule 0  . furosemide (LASIX) 40 MG tablet TAKE 1 TABLET (40 MG TOTAL) BY MOUTH DAILY. 30 tablet 5  . Glycopyrrolate-Formoterol (BEVESPI AEROSPHERE) 9-4.8 MCG/ACT AERO Inhale 1 puff into the lungs daily as needed (respiratory issues.).     [name] [name] magnesium oxide (MAG-OX) 400 (241.3 Mg) MG tablet Take 1 tablet (400 mg total) by mouth daily. (Patient not taking: Reported on 02/19/2018) 30 tablet 6  . metoprolol tartrate (LOPRESSOR) 50 MG tablet Take 1.5 tablets (75 mg total) by mouth 2 (two) times daily. Please keep upcoming appt in November for future refills. Thank you 90 tablet 0  . ondansetron (ZOFRAN ODT) 4 MG disintegrating tablet Take 1 tablet (4 mg total) by mouth every 8 (eight) hours as needed for nausea or vomiting. 20 tablet 0  . rosuvastatin (CRESTOR) 40 MG tablet Take 1  tablet (40 mg total) by mouth daily. 90 tablet 3   No current facility-administered medications for this visit.     Allergies:   Patient has no known allergies.   Social History:  The patient  reports that he has quit smoking. He has a 30.00 pack-year smoking history. He has never used smokeless tobacco.  He reports current alcohol use of about 4.0 standard drinks of alcohol per week. He reports that he does not use drugs.   Family History:  The patient's family history includes Atrial fibrillation in his brother; CAD in his brother; COPD in his father and mother.  ROS:  Please see the history of present illness.  All other systems are reviewed and otherwise negative.   PHYSICAL EXAM: *** VS:  There were no vitals taken for this visit. BMI: There is no height or weight on file to calculate BMI. Well nourished, well developed, in no acute distress  HEENT: normocephalic, atraumatic  Neck: no JVD, carotid bruits or masses Cardiac:  *** RRR; no significant murmurs, no rubs, or gallops Lungs:  *** CTA b/l, no wheezing, rhonchi or rales  Abd: soft, nontender MS: no deformity or *** atrophy Ext: *** no edema  Skin: warm and dry, no rash Neuro:  No gross deficits appreciated Psych: euthymic mood, full affect    EKG:  Done today and reviewed by myself: ***  Recent Labs: 02/08/2018: Magnesium 2.1 06/25/2018: ALT 75; BUN 9; Creatinine, Ser 1.16; Hemoglobin 15.9; Platelets 258; Potassium 3.8; Sodium 139  02/08/2018: Chol/HDL Ratio 3.9; Cholesterol, Total 183; HDL 47; LDL Calculated 111; Triglycerides 125   CrCl cannot be calculated (Patient's most recent lab result is older than the maximum 21 days allowed.).   Wt Readings from Last 3 Encounters:  06/24/18 230 lb (104.3 kg)  02/23/18 234 lb 2.1 oz (106.2 kg)  02/08/18 239 lb 9.6 oz (108.7 kg)     Other studies reviewed: Additional studies/records reviewed today include: summarized above  ASSESSMENT AND PLAN:  1. Persistent AFib     CHA2DS2Vasc is one, not on a/c     *** Tikosyn   2. HTN     ***  3. Diastolic dysfunction/cath     ***  4. Mild non-obstructive CAD     *** lpids     Disposition: F/u with ***   Current medicines are reviewed at length with the patient today.  The patient did not have any concerns regarding  medicines.***  Signed, Tommye Standard, PA-C 01/06/2019 1:44 PM     [place] [address] [phone] (office)  [phone] (fax)

## 2019-01-08 ENCOUNTER — Ambulatory Visit: Payer: Medicaid Other | Admitting: Physician Assistant

## 2019-01-25 ENCOUNTER — Other Ambulatory Visit: Payer: Self-pay | Admitting: Cardiology

## 2019-02-28 ENCOUNTER — Other Ambulatory Visit: Payer: Self-pay | Admitting: Cardiology

## 2019-03-28 ENCOUNTER — Other Ambulatory Visit: Payer: Self-pay | Admitting: Cardiology

## 2019-03-28 ENCOUNTER — Telehealth: Payer: Self-pay | Admitting: Cardiology

## 2019-03-28 NOTE — Telephone Encounter (Signed)
New Message   *STAT* If patient is at the pharmacy, call can be transferred to refill team.   1. Which medications need to be refilled? (please list name of each medication and dose if known) dofetilide (TIKOSYN) 500 MCG capsule metoprolol tartrate (LOPRESSOR) 50 MG tablet  2. Which pharmacy/location (including street and city if local pharmacy) is medication to be sent to? [facility] - [place], [place] - [address]  3. Do they need a 30 day or 90 day supply?  90 day

## 2019-03-28 NOTE — Telephone Encounter (Signed)
Pt's medications were sent to pt's pharmacy as requested. Confirmation received.  

## 2019-05-03 ENCOUNTER — Encounter (HOSPITAL_BASED_OUTPATIENT_CLINIC_OR_DEPARTMENT_OTHER): Payer: Self-pay

## 2019-05-03 ENCOUNTER — Ambulatory Visit: Payer: Medicaid Other

## 2019-05-03 ENCOUNTER — Emergency Department (HOSPITAL_BASED_OUTPATIENT_CLINIC_OR_DEPARTMENT_OTHER): Payer: Medicaid Other

## 2019-05-03 ENCOUNTER — Other Ambulatory Visit: Payer: Self-pay

## 2019-05-03 ENCOUNTER — Emergency Department (HOSPITAL_BASED_OUTPATIENT_CLINIC_OR_DEPARTMENT_OTHER)
Admission: EM | Admit: 2019-05-03 | Discharge: 2019-05-03 | Disposition: A | Discharge status: Home / Self Care | Payer: Medicaid Other | Attending: Emergency Medicine | Admitting: Emergency Medicine

## 2019-05-03 DIAGNOSIS — W1842XA Slipping, tripping and stumbling without falling due to stepping into hole or opening, initial encounter: Secondary | ICD-10-CM | POA: Insufficient documentation

## 2019-05-03 DIAGNOSIS — S299XXA Unspecified injury of thorax, initial encounter: Secondary | ICD-10-CM | POA: Diagnosis present

## 2019-05-03 DIAGNOSIS — Z87891 Personal history of nicotine dependence: Secondary | ICD-10-CM | POA: Insufficient documentation

## 2019-05-03 DIAGNOSIS — Z7901 Long term (current) use of anticoagulants: Secondary | ICD-10-CM | POA: Diagnosis not present

## 2019-05-03 DIAGNOSIS — Y9301 Activity, walking, marching and hiking: Secondary | ICD-10-CM | POA: Diagnosis not present

## 2019-05-03 DIAGNOSIS — I110 Hypertensive heart disease with heart failure: Secondary | ICD-10-CM | POA: Insufficient documentation

## 2019-05-03 DIAGNOSIS — Y92007 Garden or yard of unspecified non-institutional (private) residence as the place of occurrence of the external cause: Secondary | ICD-10-CM | POA: Diagnosis not present

## 2019-05-03 DIAGNOSIS — I5032 Chronic diastolic (congestive) heart failure: Secondary | ICD-10-CM | POA: Diagnosis not present

## 2019-05-03 DIAGNOSIS — J449 Chronic obstructive pulmonary disease, unspecified: Secondary | ICD-10-CM | POA: Insufficient documentation

## 2019-05-03 DIAGNOSIS — I4819 Other persistent atrial fibrillation: Secondary | ICD-10-CM | POA: Insufficient documentation

## 2019-05-03 DIAGNOSIS — Z20822 Contact with and (suspected) exposure to covid-19: Secondary | ICD-10-CM | POA: Insufficient documentation

## 2019-05-03 DIAGNOSIS — R1012 Left upper quadrant pain: Secondary | ICD-10-CM | POA: Insufficient documentation

## 2019-05-03 DIAGNOSIS — Z9861 Coronary angioplasty status: Secondary | ICD-10-CM | POA: Diagnosis not present

## 2019-05-03 DIAGNOSIS — Y999 Unspecified external cause status: Secondary | ICD-10-CM | POA: Insufficient documentation

## 2019-05-03 DIAGNOSIS — W19XXXA Unspecified fall, initial encounter: Secondary | ICD-10-CM

## 2019-05-03 DIAGNOSIS — Z79899 Other long term (current) drug therapy: Secondary | ICD-10-CM | POA: Insufficient documentation

## 2019-05-03 DIAGNOSIS — S2232XA Fracture of one rib, left side, initial encounter for closed fracture: Secondary | ICD-10-CM | POA: Diagnosis not present

## 2019-05-03 LAB — CBC WITH DIFFERENTIAL/PLATELET
Abs Immature Granulocytes: 0.03 10*3/uL (ref 0.00–0.07)
Basophils Absolute: 0.1 10*3/uL (ref 0.0–0.1)
Basophils Relative: 1 %
Eosinophils Absolute: 0.2 10*3/uL (ref 0.0–0.5)
Eosinophils Relative: 2 %
HCT: 47.2 % (ref 39.0–52.0)
Hemoglobin: 15.7 g/dL (ref 13.0–17.0)
Immature Granulocytes: 0 %
Lymphocytes Relative: 13 %
Lymphs Abs: 1.1 10*3/uL (ref 0.7–4.0)
MCH: 31.7 pg (ref 26.0–34.0)
MCHC: 33.3 g/dL (ref 30.0–36.0)
MCV: 95.2 fL (ref 80.0–100.0)
Monocytes Absolute: 0.5 10*3/uL (ref 0.1–1.0)
Monocytes Relative: 5 %
Neutro Abs: 6.6 10*3/uL (ref 1.7–7.7)
Neutrophils Relative %: 79 %
Platelets: 222 10*3/uL (ref 150–400)
RBC: 4.96 MIL/uL (ref 4.22–5.81)
RDW: 12.4 % (ref 11.5–15.5)
WBC: 8.4 10*3/uL (ref 4.0–10.5)
nRBC: 0 % (ref 0.0–0.2)

## 2019-05-03 LAB — COMPREHENSIVE METABOLIC PANEL
ALT: 43 U/L (ref 0–44)
AST: 42 U/L — ABNORMAL HIGH (ref 15–41)
Albumin: 4.3 g/dL (ref 3.5–5.0)
Alkaline Phosphatase: 57 U/L (ref 38–126)
Anion gap: 9 (ref 5–15)
BUN: 14 mg/dL (ref 6–20)
CO2: 28 mmol/L (ref 22–32)
Calcium: 8.8 mg/dL — ABNORMAL LOW (ref 8.9–10.3)
Chloride: 100 mmol/L (ref 98–111)
Creatinine, Ser: 1 mg/dL (ref 0.61–1.24)
GFR calc Af Amer: >60 mL/min (ref >60)
GFR calc non Af Amer: >60 mL/min (ref >60)
Glucose, Bld: 99 mg/dL (ref 70–99)
Potassium: 4.5 mmol/L (ref 3.5–5.1)
Sodium: 137 mmol/L (ref 135–145)
Total Bilirubin: 0.9 mg/dL (ref 0.3–1.2)
Total Protein: 8.2 g/dL — ABNORMAL HIGH (ref 6.5–8.1)

## 2019-05-03 LAB — URINALYSIS, ROUTINE W REFLEX MICROSCOPIC
Bilirubin Urine: NEGATIVE
Glucose, UA: NEGATIVE mg/dL
Hgb urine dipstick: NEGATIVE
Ketones, ur: NEGATIVE mg/dL
Leukocytes,Ua: NEGATIVE
Nitrite: NEGATIVE
Protein, ur: NEGATIVE mg/dL
Specific Gravity, Urine: >1.03 — ABNORMAL HIGH (ref 1.005–1.030)
pH: 5.5 (ref 5.0–8.0)

## 2019-05-03 LAB — BRAIN NATRIURETIC PEPTIDE: B Natriuretic Peptide: 49.6 pg/mL (ref 0.0–100.0)

## 2019-05-03 MED ORDER — OXYCODONE-ACETAMINOPHEN 5-325 MG PO TABS: 1.0000 | ORAL_TABLET | Freq: Four times a day (QID) | ORAL | 0 refills | Status: DC | PRN

## 2019-05-03 MED ORDER — PROMETHAZINE HCL 25 MG/ML IJ SOLN
INTRAMUSCULAR | Status: AC
  Filled 2019-05-03: qty 1

## 2019-05-03 MED ORDER — OXYCODONE-ACETAMINOPHEN 5-325 MG PO TABS
1.0000 | ORAL_TABLET | Freq: Once | ORAL | Status: AC
  Administered 2019-05-03: 1 via ORAL
  Filled 2019-05-03: qty 1

## 2019-05-03 MED ORDER — SODIUM CHLORIDE 0.9 % IV BOLUS
500.0000 mL | Freq: Once | INTRAVENOUS | Status: AC
  Administered 2019-05-03: 500 mL via INTRAVENOUS

## 2019-05-03 MED ORDER — FENTANYL CITRATE (PF) 100 MCG/2ML IJ SOLN
50.0000 ug | Freq: Once | INTRAMUSCULAR | Status: AC
  Administered 2019-05-03: 50 ug via INTRAVENOUS
  Filled 2019-05-03: qty 2

## 2019-05-03 MED ORDER — PROMETHAZINE HCL 25 MG/ML IJ SOLN
12.5000 mg | Freq: Once | INTRAMUSCULAR | Status: AC
  Administered 2019-05-03: 12.5 mg via INTRAVENOUS

## 2019-05-03 MED ORDER — IOHEXOL 300 MG/ML  SOLN
100.0000 mL | Freq: Once | INTRAMUSCULAR | Status: AC | PRN
  Administered 2019-05-03: 100 mL via INTRAVENOUS

## 2019-05-03 MED ORDER — MORPHINE SULFATE (PF) 4 MG/ML IV SOLN
4.0000 mg | Freq: Once | INTRAVENOUS | Status: DC
  Filled 2019-05-03: qty 1

## 2019-05-03 NOTE — ED Provider Notes (Signed)
[facility] EMERGENCY DEPARTMENT Provider Note   CSN: [id number] Arrival date & time: 05/03/19  1355     History Chief Complaint  Patient presents with  . Chest Pain    [name] [name] is a 59 y.o. male with a past medical history of CHF, COPD, hypertension, obesity, A. fib treated with Tikosyn, not anticoagulated, who presents today for evaluation of left-sided chest pain. He reports that on Wednesday he had a mechanical nonsyncopal fall after he stepped in a hole in the yard.  He was holding pipes at the time and fell with his left arm hitting his left-sided chest wall.  He reports that he has been sitting in the recliner since then and helps with the pain with L however it continues to get worse.  He states that he decided to come in today as he is having shortness of breath which is new worse.  He denies any fevers.  Her pain is made worse with moving and breathing.  He has not eaten anything since last night.    He also reports left upper abdominal pain.  He denies striking his head or passing out.  No pain in his head or his neck.  No vomiting or diarrhea.  HPI     Past Medical History:  Diagnosis Date  . Chronic diastolic CHF (congestive heart failure) (HCC)   . COPD (chronic obstructive pulmonary disease) (HCC)   . Hypertension   . Obesity (BMI 30-39.9) 12/15/2016  . OSA (obstructive sleep apnea)   . Persistent atrial fibrillation (HCC) 12/15/2016  . Visit for monitoring Tikosyn therapy     Patient Active Problem List   Diagnosis Date Noted  . Angina pectoris (HCC) 02/22/2018  . Shortness of breath 02/22/2018  . (HFpEF) heart failure with preserved ejection fraction (HCC) 02/22/2018  . Hypertensive heart disease without CHF 03/22/2017  . Persistent atrial fibrillation (HCC) 12/15/2016  . Obesity (BMI 30-39.9) 12/15/2016  . Long term current use of anticoagulant therapy 12/15/2016    Past Surgical History:  Procedure Laterality Date  . CARDIAC  CATHETERIZATION  02/22/2018  . CARDIOVERSION N/A 12/22/2016   Procedure: CARDIOVERSION;  Surgeon: Othella Boyer, MD;  Location: [facility] ENDOSCOPY;  Service: Cardiovascular;  Laterality: N/A;  . HEMORROIDECTOMY    . HERNIA REPAIR    . RIGHT/LEFT HEART CATH AND CORONARY ANGIOGRAPHY N/A 02/22/2018   Procedure: RIGHT/LEFT HEART CATH AND CORONARY ANGIOGRAPHY;  Surgeon: Yvonne Kendall, MD;  Location: [facility] INVASIVE CV LAB;  Service: Cardiovascular;  Laterality: N/A;       Family History  Problem Relation Age of Onset  . COPD Mother   . COPD Father   . Atrial fibrillation Brother   . CAD Brother     Social History   Tobacco Use  . Smoking status: Former Smoker    Packs/day: 1.00    Years: 30.00    Pack years: 30.00  . Smokeless tobacco: Never Used  Substance Use Topics  . Alcohol use: Yes    Alcohol/week: 4.0 standard drinks    Types: 4 Cans of beer per week  . Drug use: No    Home Medications Prior to Admission medications   Medication Sig Start Date End Date Taking? Authorizing Provider  dofetilide (TIKOSYN) 500 MCG capsule Take 1 capsule (500 mcg total) by mouth 2 (two) times daily. TAKE 1 CAPSULE BY MOUTH 2 TIMES DAILY. - pt needs to keep appt in April for further refills 03/28/19   Regan Lemming, MD  furosemide (  LASIX) 40 MG tablet TAKE 1 TABLET (40 MG TOTAL) BY MOUTH DAILY. 08/28/18   Berton Bon, NP  Glycopyrrolate-Formoterol (BEVESPI AEROSPHERE) 9-4.8 MCG/ACT AERO Inhale 1 puff into the lungs daily as needed (respiratory issues.).     [provider]  magnesium oxide (MAG-OX) 400 (241.3 Mg) MG tablet Take 1 tablet (400 mg total) by mouth daily. Patient not taking: Reported on 02/19/2018 03/23/17   Sheilah Pigeon, PA-C  metoprolol tartrate (LOPRESSOR) 50 MG tablet Take 1.5 tablets (75 mg total) by mouth 2 (two) times daily. Pt must keep upcoming appt in April for further refills 03/28/19   Camnitz, Andree Coss, MD  ondansetron (ZOFRAN ODT) 4 MG disintegrating  tablet Take 1 tablet (4 mg total) by mouth every 8 (eight) hours as needed for nausea or vomiting. 06/25/18   [name], Mayer Masker, MD  oxyCODONE-acetaminophen (PERCOCET/ROXICET) 5-325 MG tablet Take 1 tablet by mouth every 6 (six) hours as needed for severe pain. 05/03/19   Cristina Gong, PA-C  rosuvastatin (CRESTOR) 40 MG tablet Take 1 tablet (40 mg total) by mouth daily. 02/13/18 05/14/18  Camnitz, Andree Coss, MD    Allergies    Patient has no known allergies.  Review of Systems   Review of Systems  Constitutional: Negative for chills and fever.  Respiratory: Positive for cough and shortness of breath.   Cardiovascular: Positive for chest pain. Negative for palpitations and leg swelling.  Gastrointestinal: Positive for abdominal pain. Negative for diarrhea, nausea and vomiting.  Musculoskeletal: Negative for back pain and neck pain.  Neurological: Negative for weakness and headaches.  All other systems reviewed and are negative.   Physical Exam Updated Vital Signs BP 138/84   Pulse 78   Temp 98.7 F (37.1 C) (Oral)   Resp 14   Ht 5\' 10"  (1.778 m)   Wt 104.3 kg   SpO2 95%   BMI 33.00 kg/m   Physical Exam Vitals and nursing note reviewed.  Constitutional:      Appearance: He is well-developed.  HENT:     Head: Normocephalic and atraumatic.  Eyes:     Conjunctiva/sclera: Conjunctivae normal.  Cardiovascular:     Rate and Rhythm: Normal rate and regular rhythm.     Pulses:          Radial pulses are 2+ on the right side and 2+ on the left side.     Heart sounds: Normal heart sounds. No murmur.  Pulmonary:     Effort: Pulmonary effort is normal. No respiratory distress.     Comments: Wet sounding speech audible without stethoscope. There are breath sounds present bilaterally, bilaterally diminished bases with coarse breath sounds. Chest:     Comments: Left anterior chest is tender to palpation inferior to the nipple. There is no crepitus or obvious deformity  palpated. Abdominal:     Palpations: Abdomen is soft.     Tenderness: There is no abdominal tenderness.  Musculoskeletal:     Cervical back: Neck supple.  Skin:    General: Skin is warm and dry.  Neurological:     Mental Status: He is alert.      ED Results / Procedures / Treatments   Labs (all labs ordered are listed, but only abnormal results are displayed) Labs Reviewed  COMPREHENSIVE METABOLIC PANEL - Abnormal; Notable for the following components:      Result Value   Calcium 8.8 (*)    Total Protein 8.2 (*)    AST 42 (*)    All  other components within normal limits  URINALYSIS, ROUTINE W REFLEX MICROSCOPIC - Abnormal; Notable for the following components:   Specific Gravity, Urine >1.030 (*)    All other components within normal limits  SARS CORONAVIRUS 2 (TAT 6-24 HRS)  CBC WITH DIFFERENTIAL/PLATELET  BRAIN NATRIURETIC PEPTIDE    EKG EKG Interpretation  Date/Time:  Saturday May 03 2019 14:38:05 EDT Ventricular Rate:  86 PR Interval:    QRS Duration: 95 QT Interval:  412 QTC Calculation: 493 R Axis:   -15 Text Interpretation: Sinus rhythm Borderline left axis deviation Borderline low voltage, extremity leads Borderline prolonged QT interval since previous tracing, previous T wave inversions have normalized Confirmed by Frederick PeersLittle, Rachel [phone]([id number]) on 05/03/2019 2:47:02 PM   Radiology DG Ribs Unilateral W/Chest Left  Result Date: 05/03/2019 CLINICAL DATA:  Status post fall 3 days ago with left chest pain. EXAM: LEFT RIBS AND CHEST - 3+ VIEW COMPARISON:  Jun 25, 2018 FINDINGS: There are mild displaced fractures of the lateral left probable fifth through eighth ribs. There is a small left pleural effusion. There is no evidence of pneumothorax. Atelectasis of left lung base is noted. The mediastinal contour and cardiac silhouette are normal. IMPRESSION: Mildly displaced fractures of the lateral left probable fifth through eighth ribs. Small left pleural effusion. No  pneumothorax Electronically Signed   By: Sherian ReinWei-Chen  Lin M.D.   On: 05/03/2019 14:44   CT Chest W Contrast  Result Date: 05/03/2019 CLINICAL DATA:  Larey SeatFell, left-sided abdominal pain, rib fractures, fell 2 days ago EXAM: CT CHEST, ABDOMEN, AND PELVIS WITH CONTRAST TECHNIQUE: Multidetector CT imaging of the chest, abdomen and pelvis was performed following the standard protocol during bolus administration of intravenous contrast. CONTRAST:  100mL OMNIPAQUE IOHEXOL 300 MG/ML  SOLN COMPARISON:  05/03/2019, 06/25/2018 FINDINGS: CT CHEST FINDINGS Cardiovascular: Heart and great vessels are unremarkable without pericardial effusion. No evidence of vascular injury. The thoracic aorta is normal in caliber without dissection. Mediastinum/Nodes: No enlarged mediastinal, hilar, or axillary lymph nodes. Thyroid gland, trachea, and esophagus demonstrate no significant findings. Lungs/Pleura: Stable upper lobe predominant emphysema, with large right apical bulla unchanged. Scattered atelectasis within the left lower lobe. Mild bilateral bronchial wall thickening unchanged. There is trace left pleural effusion. No pneumothorax. Musculoskeletal: There are chronic appearing left anterolateral fourth through sixth rib fractures, with evidence of moderate callus formation. These are new since previous study. There is an acute appearing left anterolateral seventh rib fracture without callus formation. No additional bony abnormalities. Reconstructed images demonstrate no additional findings. CT ABDOMEN PELVIS FINDINGS Hepatobiliary: No hepatic injury or perihepatic hematoma. Gallbladder is unremarkable Pancreas: Unremarkable. No pancreatic ductal dilatation or surrounding inflammatory changes. Spleen: No splenic injury or perisplenic hematoma. Adrenals/Urinary Tract: Adrenal glands are unremarkable. Kidneys are normal, without renal calculi, focal lesion, or hydronephrosis. Bladder is unremarkable. Stomach/Bowel: No bowel obstruction or  ileus. There is a normal appendix right lower quadrant. Postsurgical changes are seen at the rectosigmoid junction, stable. Vascular/Lymphatic: Aortic atherosclerosis. No enlarged abdominal or pelvic lymph nodes. Reproductive: Prostate is unremarkable. Other: No abdominal wall hernia or abnormality. No abdominopelvic ascites. Musculoskeletal: No acute displaced fractures. Reconstructed images are unremarkable. IMPRESSION: 1. Acute left anterolateral seventh rib fracture. 2. Chronic appearing left anterolateral fourth through sixth rib fractures, with moderate callus formation. 3. Trace left pleural effusion. 4. No acute intra-abdominal or intrapelvic trauma. 5.  Emphysema (ICD10-J43.9). Electronically Signed   By: Sharlet SalinaMichael  Brown M.D.   On: 05/03/2019 16:22   CT Abdomen Pelvis W Contrast  Result Date: 05/03/2019  CLINICAL DATA:  Larey Seat, left-sided abdominal pain, rib fractures, fell 2 days ago EXAM: CT CHEST, ABDOMEN, AND PELVIS WITH CONTRAST TECHNIQUE: Multidetector CT imaging of the chest, abdomen and pelvis was performed following the standard protocol during bolus administration of intravenous contrast. CONTRAST:  OMNIPAQUE IOHEXOL 300 MG/ML  SOLN COMPARISON:  05/03/2019, 06/25/2018 FINDINGS: CT CHEST FINDINGS Cardiovascular: Heart and great vessels are unremarkable without pericardial effusion. No evidence of vascular injury. The thoracic aorta is normal in caliber without dissection. Mediastinum/Nodes: No enlarged mediastinal, hilar, or axillary lymph nodes. Thyroid gland, trachea, and esophagus demonstrate no significant findings. Lungs/Pleura: Stable upper lobe predominant emphysema, with large right apical bulla unchanged. Scattered atelectasis within the left lower lobe. Mild bilateral bronchial wall thickening unchanged. There is trace left pleural effusion. No pneumothorax. Musculoskeletal: There are chronic appearing left anterolateral fourth through sixth rib fractures, with evidence of moderate  callus formation. These are new since previous study. There is an acute appearing left anterolateral seventh rib fracture without callus formation. No additional bony abnormalities. Reconstructed images demonstrate no additional findings. CT ABDOMEN PELVIS FINDINGS Hepatobiliary: No hepatic injury or perihepatic hematoma. Gallbladder is unremarkable Pancreas: Unremarkable. No pancreatic ductal dilatation or surrounding inflammatory changes. Spleen: No splenic injury or perisplenic hematoma. Adrenals/Urinary Tract: Adrenal glands are unremarkable. Kidneys are normal, without renal calculi, focal lesion, or hydronephrosis. Bladder is unremarkable. Stomach/Bowel: No bowel obstruction or ileus. There is a normal appendix right lower quadrant. Postsurgical changes are seen at the rectosigmoid junction, stable. Vascular/Lymphatic: Aortic atherosclerosis. No enlarged abdominal or pelvic lymph nodes. Reproductive: Prostate is unremarkable. Other: No abdominal wall hernia or abnormality. No abdominopelvic ascites. Musculoskeletal: No acute displaced fractures. Reconstructed images are unremarkable. IMPRESSION: 1. Acute left anterolateral seventh rib fracture. 2. Chronic appearing left anterolateral fourth through sixth rib fractures, with moderate callus formation. 3. Trace left pleural effusion. 4. No acute intra-abdominal or intrapelvic trauma. 5.  Emphysema (ICD10-J43.9). Electronically Signed   By: Sharlet Salina M.D.   On: 05/03/2019 16:22    Procedures Procedures (including critical care time)  Medications Ordered in ED Medications  fentaNYL (SUBLIMAZE) injection 50 mcg (50 mcg Intravenous Given 05/03/19 1448)  sodium chloride 0.9 % bolus 500 mL (0 mLs Intravenous Stopped 05/03/19 1647)  promethazine (PHENERGAN) injection 12.5 mg (12.5 mg Intravenous Not Given 05/03/19 1555)  fentaNYL (SUBLIMAZE) injection 50 mcg (50 mcg Intravenous Given 05/03/19 1547)  iohexol (OMNIPAQUE) 300 MG/ML solution 100 mL (100 mLs  Intravenous Contrast Given 05/03/19 1553)  oxyCODONE-acetaminophen (PERCOCET/ROXICET) 5-325 MG per tablet 1 tablet (1 tablet Oral Given 05/03/19 1651)    ED Course  I have reviewed the triage vital signs and the nursing notes.  Pertinent labs & imaging results that were available during my care of the patient were reviewed by me and considered in my medical decision making (see chart for details).  Clinical Course as of May 03 2346  Sat May 03, 2019  3790 Was informed that patient wanted the pharmacy changed for his oxycodone.     [EH]    Clinical Course User Index [EH] Norman Clay   MDM Rules/Calculators/A&P                     KEON PENDER is a 59 year old man who presents today for left sided chest pain since a fall.  He reports today he started feeling short of breath.  His fall was mechanical, non syncopal.  He denies any other injuries.    CBC and  CMP without acute abnormalities.  UA shows mild dehydration.  BNP is not elevated.  Chest x-ray is were obtained, due to his shortness of breath went with patient to x-ray and reviewed images without clear evidence of pneumothorax. X-ray rib series shows concern for for left-sided broken ribs.  His pain was treated in the emergency room. Based on him having abdominal pain and chest pain CT scan chest, abdomen and pelvis was obtained. While x-ray showed 4 ribs, CT clarifies that only one rib fracture is acute, the other 3 are chronic with moderate callus formation.  No other acute abnormalities on CT. Patient's pain was able to be controlled in the emergency room.  [place] PMP is consulted.  Originally he requested that the narcotics be sent to one pharmacy, however when he got there he found out that it was closed and called back requesting that I send it to a different pharmacy.  I sent the medication, and then called the original pharmacy and left a message asking them to not fill that prescription.  Return  precautions were discussed with patient who states their understanding.  At the time of discharge patient denied any unaddressed complaints or concerns.  Patient is agreeable for discharge home.  Note: Portions of this report may have been transcribed using voice recognition software. Every effort was made to ensure accuracy; however, inadvertent computerized transcription errors may be present  Final Clinical Impression(s) / ED Diagnoses Final diagnoses:  Closed fracture of one rib of left side, initial encounter  Fall, initial encounter    Rx / DC Orders ED Discharge Orders         Ordered    oxyCODONE-acetaminophen (PERCOCET/ROXICET) 5-325 MG tablet  Every 6 hours PRN,   Status:  Discontinued     05/03/19 1732    oxyCODONE-acetaminophen (PERCOCET/ROXICET) 5-325 MG tablet  Every 6 hours PRN     05/03/19 1853           Lorin Glass, PA-C 05/03/19 2357    Malvin Johns, MD 05/03/19 2359

## 2019-05-03 NOTE — ED Notes (Signed)
ED Provider at bedside. 

## 2019-05-03 NOTE — Discharge Instructions (Addendum)
Please try not to take ibuprofen unless needed.  Your pain medication contains tylenol.   Please take Ibuprofen (Advil, motrin) and Tylenol (acetaminophen) to relieve your pain.  You may take up to 600 MG (3 pills) of normal strength ibuprofen every 8 hours as needed.  In between doses of ibuprofen you make take tylenol, up to 650mg  (two normal strength pills).  Do not take more than 3,000 mg tylenol in a 24 hour period.  Please check all medication labels as many medications such as pain and cold medications may contain tylenol.  Do not drink alcohol while taking these medications.  Do not take other NSAID'S while taking ibuprofen (such as aleve or naproxen).  Please take ibuprofen with food to decrease stomach upset.  Today you received medications that may make you sleepy or impair your ability to make decisions.  For the next 24 hours please do not drive, operate heavy machinery, care for a small child with out another adult present, or perform any activities that may cause harm to you or someone else if you were to fall asleep or be impaired.   You are being prescribed a medication which may make you sleepy. Please follow up of listed precautions for at least 24 hours after taking one dose.

## 2019-05-03 NOTE — ED Notes (Signed)
Patient transported to X-ray 

## 2019-05-03 NOTE — ED Triage Notes (Signed)
Pt reports he had a fall Wednesday after stepping in a hole in the yard, noticed left sided rib pain at the time, reports it has gotten worse.

## 2019-05-04 LAB — SARS CORONAVIRUS 2 (TAT 6-24 HRS): SARS Coronavirus 2: NEGATIVE

## 2019-05-09 ENCOUNTER — Encounter: Payer: Self-pay | Admitting: Cardiology

## 2019-05-09 ENCOUNTER — Ambulatory Visit: Payer: Medicaid Other | Admitting: Cardiology

## 2019-05-09 ENCOUNTER — Other Ambulatory Visit: Payer: Self-pay

## 2019-05-09 VITALS — BP 118/64 | HR 78 | Ht 70.0 in | Wt 255.0 lb

## 2019-05-09 DIAGNOSIS — Z79899 Other long term (current) drug therapy: Secondary | ICD-10-CM

## 2019-05-09 DIAGNOSIS — R0781 Pleurodynia: Secondary | ICD-10-CM | POA: Insufficient documentation

## 2019-05-09 DIAGNOSIS — I4811 Longstanding persistent atrial fibrillation: Secondary | ICD-10-CM

## 2019-05-09 MED ORDER — TORSEMIDE 20 MG PO TABS: 20.0000 mg | ORAL_TABLET | Freq: Two times a day (BID) | ORAL | 3 refills | Status: DC

## 2019-05-09 MED ORDER — METOPROLOL SUCCINATE ER 100 MG PO TB24: 100.0000 mg | ORAL_TABLET | Freq: Every day | ORAL | 3 refills | Status: DC

## 2019-05-09 NOTE — Patient Instructions (Addendum)
Medication Instructions:  Your physician has recommended you make the following change in your medication:  1. STOP Metoprolol Tartrate (Lopressor) 2. START Metoprolol Succinate (Toprol) 100 mg once daily at bedtime 3. STOP Lasix 4. START Torsemide 20 mg daily  *If you need a refill on your cardiac medications before your next appointment, please call your pharmacy*   Lab Work: Your physician recommends that you return for lab work in: 10 days for BMET If you have labs (blood work) drawn today and your tests are completely normal, you will receive your results only by: [name] MyChart Message (if you have MyChart) OR . A paper copy in the mail If you have any lab test that is abnormal or we need to change your treatment, we will call you to review the results.   Testing/Procedures: Your physician has requested that you have an echocardiogram in 2-3 weeks. Echocardiography is a painless test that uses sound waves to create images of your heart. It provides your doctor with information about the size and shape of your heart and how well your heart's chambers and valves are working. This procedure takes approximately one hour. There are no restrictions for this procedure.   Follow-Up: At [facility], you and your health needs are our priority.  As part of our continuing mission to provide you with exceptional heart care, we have created designated Provider Care Teams.  These Care Teams include your primary Cardiologist (physician) and Advanced Practice Providers (APPs -  Physician Assistants and Nurse Practitioners) who all work together to provide you with the care you need, when you need it.  We recommend signing up for the patient portal called "MyChart".  Sign up information is provided on this After Visit Summary.  MyChart is used to connect with patients for Virtual Visits (Telemedicine).  Patients are able to view lab/test results, encounter notes, upcoming appointments, etc.  Non-urgent  messages can be sent to your provider as well.   To learn more about what you can do with MyChart, go to [email or web address].    Your next appointment:   3 month(s)  The format for your next appointment:   In Person  Provider:   Allegra Lai, MD   Thank you for choosing [facility]!!   Trinidad Curet, RN [phone]    Other Instructions  Torsemide Oral Tablets What is this medicine? TORSEMIDE (TORE se mide) is a diuretic. It helps you make more urine and lose salt and water from your body. It treats swelling from heart, kidney, or liver disease. It also treats high blood pressure. This medicine may be used for other purposes; ask your health care provider or pharmacist if you have questions. COMMON BRAND NAME(S): Demadex What should I tell my health care provider before I take this medicine? They need to know if you have any of these conditions:  high or low levels of electrolytes, like magnesium, potassium, and sodium, in your blood  diabetes  gout  kidney disease  liver disease  an unusual or allergic reaction to torsemide, povidone, other medicines, foods, dyes, or preservatives  pregnant or trying to get pregnant  breast-feeding How should I use this medicine? Take this drug by mouth with water. Take it as directed on the prescription label at the same time every day. Keep taking it unless your health care provider tells you to stop. Talk to your health care provider about the use of this drug in children. Special care may be needed. Overdosage: If you think  you have taken too much of this medicine contact a poison control center or emergency room at once. NOTE: This medicine is only for you. Do not share this medicine with others. What if I miss a dose? If you miss a dose, take it as soon as you can. If it is almost time for your next dose, take only that dose. Do not take double or extra doses. What may interact with this  medicine?  alcohol  aspirin and aspirin-like medicines  celecoxib  certain medicines for blood pressure, heart disease, irregular heartbeat  certain medicines for cholesterol like cholestyramine  certain medicines for diabetes  cisplatin  cyclosporine  ephedra  ginseng  lithium  medicines for infection like acyclovir, adefovir, amphotericin B, bacitracin, cidofovir, foscarnet, ganciclovir, gentamicin, pentamidine, vancomycin  medicines that relax muscles for surgery  NSAIDs, medicines for pain and inflammation, like ibuprofen or naproxen  other diuretics  pamidronate  probenecid  rifampin  steroid medicines like prednisone or cortisone  warfarin  zoledronic acid This list may not describe all possible interactions. Give your health care provider a list of all the medicines, herbs, non-prescription drugs, or dietary supplements you use. Also tell them if you smoke, drink alcohol, or use illegal drugs. Some items may interact with your medicine. What should I watch for while using this medicine? Visit your health care provider for regular checks on your progress. Tell your health care provider if your symptoms do not start to get better or if they get worse. Check your blood pressure regularly. Ask your health care provider what your blood pressure should be. Also, find out when you should contact him or her. You may need blood work done while you are taking this drug. Do not treat yourself for coughs, colds, or pain while using this drug without asking your health care provider for advice. Some drugs may increase your blood pressure. This drug may increase blood sugar. Ask your health care provider if changes in diet or drugs are needed if you have diabetes. You may need to be on a special diet while you are taking this drug. Ask your health care provider. Also, find out how many glasses of fluids you need to drink each day. Check with your health care provider if you  have severe diarrhea, nausea, and vomiting, or if you sweat a lot. The loss of too much body fluid may make it dangerous for you to take this drug. You may get drowsy or dizzy. Do not drive, use machinery, or do anything that needs mental alertness until you know how this drug affects you. Do not stand or sit up quickly, especially if you are an older patient. This reduces the risk of dizzy or fainting spells. Alcohol may interfere with the effects of this drug. Avoid alcoholic drinks. What side effects may I notice from receiving this medicine? Side effects that you should report to your doctor or health care professional as soon as possible:  allergic reactions (skin rash, itching or hives; swelling of the face, lips, or tongue)  decreased hearing, ringing in the ears  electrolyte imbalance (increased thirst; loss of appetite; severe diarrhea; unusual sweating; vomiting)  kidney injury (trouble passing urine or change in the amount of urine)  low potassium (trouble breathing, chest pain; dizziness; fast, irregular heartbeat; feeling faints or lightheaded, falls; muscle cramps or pain) Side effects that usually do not require medical attention (report to your doctor or health care professional if they continue or are bothersome):  passing  large amounts of urine  stomach pain This list may not describe all possible side effects. Call your doctor for medical advice about side effects. You may report side effects to FDA at 1-800-FDA-1088. Where should I keep my medicine? Keep out of the reach of children and pets. Store at room temperature between 15 and 30 degrees C (59 and 86 degrees F). Do not freeze. Throw away any unused drug after the expiration date. NOTE: This sheet is a summary. It may not cover all possible information. If you have questions about this medicine, talk to your doctor, pharmacist, or health care provider.  2020 Elsevier/Gold Standard (2018-10-10  19:52:40)     Metoprolol Extended-Release Tablets What is this medicine? METOPROLOL (me TOE proe lole) is a beta blocker. It decreases the amount of work your heart has to do and helps your heart beat regularly. It treats high blood pressure and/or prevent chest pain (also called angina). It also treats heart failure. This medicine may be used for other purposes; ask your health care provider or pharmacist if you have questions. COMMON BRAND NAME(S): toprol, Toprol XL What should I tell my health care provider before I take this medicine? They need to know if you have any of these conditions:  diabetes  heart or vessel disease like slow heart rate, worsening heart failure, heart block, sick sinus syndrome or Raynaud's disease  kidney disease  liver disease  lung or breathing disease, like asthma or emphysema  pheochromocytoma  thyroid disease  an unusual or allergic reaction to metoprolol, other beta-blockers, medicines, foods, dyes, or preservatives  pregnant or trying to get pregnant  breast-feeding How should I use this medicine? Take this drug by mouth. Take it as directed on the prescription label at the same time every day. Take it with food. You may cut the tablet in half if it is scored (has a line in the middle of it). This may help you swallow the tablet if the whole tablet is too big. Be sure to take both halves. Do not take just one-half of the tablet. Keep taking it unless your health care provider tells you to stop. Talk to your health care provider about the use of this drug in children. While it may be prescribed for children as young as 6 for selected conditions, precautions do apply. Overdosage: If you think you have taken too much of this medicine contact a poison control center or emergency room at once. NOTE: This medicine is only for you. Do not share this medicine with others. What if I miss a dose? If you miss a dose, take it as soon as you can. If it is  almost time for your next dose, take only that dose. Do not take double or extra doses. What may interact with this medicine? This medicine may interact with the following medications:  certain medicines for blood pressure, heart disease, irregular heart beat  certain medicines for depression, like monoamine oxidase (MAO) inhibitors, fluoxetine, or paroxetine  clonidine  dobutamine  epinephrine  isoproterenol  reserpine This list may not describe all possible interactions. Give your health care provider a list of all the medicines, herbs, non-prescription drugs, or dietary supplements you use. Also tell them if you smoke, drink alcohol, or use illegal drugs. Some items may interact with your medicine. What should I watch for while using this medicine? Visit your doctor or health care professional for regular check ups. Contact your doctor right away if your symptoms worsen. Check your  blood pressure and pulse rate regularly. Ask your health care professional what your blood pressure and pulse rate should be, and when you should contact them. You may get drowsy or dizzy. Do not drive, use machinery, or do anything that needs mental alertness until you know how this medicine affects you. Do not sit or stand up quickly, especially if you are an older patient. This reduces the risk of dizzy or fainting spells. Contact your doctor if these symptoms continue. Alcohol may interfere with the effect of this medicine. Avoid alcoholic drinks. This medicine may increase blood sugar. Ask your healthcare provider if changes in diet or medicines are needed if you have diabetes. What side effects may I notice from receiving this medicine? Side effects that you should report to your doctor or health care professional as soon as possible:  allergic reactions like skin rash, itching or hives  cold or numb hands or feet  depression  difficulty breathing  faint  fever with sore throat  irregular  heartbeat, chest pain  rapid weight gain   signs and symptoms of high blood sugar such as being more thirsty or hungry or having to urinate more than normal. You may also feel very tired or have blurry vision.  swollen legs or ankles Side effects that usually do not require medical attention (report to your doctor or health care professional if they continue or are bothersome):  anxiety or nervousness  change in sex drive or performance  dry skin  headache  nightmares or trouble sleeping  short term memory loss  stomach upset or diarrhea This list may not describe all possible side effects. Call your doctor for medical advice about side effects. You may report side effects to FDA at 1-800-FDA-1088. Where should I keep my medicine? Keep out of the reach of children and pets. Store at room temperature between 20 and 25 degrees C (68 and 77 degrees F). Throw away any unused drug after the expiration date. NOTE: This sheet is a summary. It may not cover all possible information. If you have questions about this medicine, talk to your doctor, pharmacist, or health care provider.  2020 Elsevier/Gold Standard (2018-09-05 18:23:00)

## 2019-05-09 NOTE — Progress Notes (Signed)
Electrophysiology Office Note   Date:  05/09/2019   ID:  [name], DOB September 07, 1960, MRN [id number]  PCP:  Leonard Downing, MD  Cardiologist:  Wynonia Lawman Primary Electrophysiologist:  Dr Curt Bears    CC: Follow up for atrial fibrilaltion   History of Present Illness: [name] [name] is a 59 y.o. male who is being seen today for the evaluation of atrial fibrillation at the request of Tollie Eth. Presenting today for electrophysiology evaluation.  He has a history of GERD, COPD, obesity, and atrial fibrillation.  He had an attempted cardioversion on 12/22/16, but was not able to be converted to sinus rhythm.  After cardioversion attempts, the patient said that he had potentially been out of rhythm for 3 or 4 years.  He has mild left atrial enlargement with mild LVH and normal systolic function.  He was admitted to the hospital for dofetilide loading.  He also had a sleep study and was found to have obstructive sleep apnea however, he admits that he has not yet picked up his CPAP machine.   He had a left and right heart catheterization that showed elevated pressures but minimal coronary artery disease.  This was 03/04/2018.  Today, denies symptoms of palpitations, chest pain,  orthopnea, PND, lower extremity edema, claudication, dizziness, presyncope, syncope, bleeding, or neurologic sequela. The patient is tolerating medications without difficulties.  His main complaints today are fatigue and shortness of breath.  His level of fatigue started when he started metoprolol.  He says that he could fall asleep just about any time.  Aside from that he is also been short of breath.  His left heart catheterization showed elevated filling pressures on both the right and left side.  He did not tolerate his Lasix as he was having quite a bit of diarrhea on it.   Past Medical History:  Diagnosis Date  . Chronic diastolic CHF (congestive heart failure) ([place])   . COPD (chronic obstructive  pulmonary disease) ([place])   . Hypertension   . Obesity (BMI 30-39.9) 12/15/2016  . OSA (obstructive sleep apnea)   . Persistent atrial fibrillation ([place]) 12/15/2016  . Visit for monitoring Tikosyn therapy    Past Surgical History:  Procedure Laterality Date  . CARDIAC CATHETERIZATION  02/22/2018  . CARDIOVERSION N/A 12/22/2016   Procedure: CARDIOVERSION;  Surgeon: Jacolyn Reedy, MD;  [facility] ENDOSCOPY;  Service: Cardiovascular;  Laterality: N/A;  . HEMORROIDECTOMY    . HERNIA REPAIR    . RIGHT/LEFT HEART CATH AND CORONARY ANGIOGRAPHY N/A 02/22/2018   Procedure: RIGHT/LEFT HEART CATH AND CORONARY ANGIOGRAPHY;  Surgeon: Nelva Bush, MD;  Location: [facility] CV LAB;  Service: Cardiovascular;  Laterality: N/A;     Current Outpatient Medications  Medication Sig Dispense Refill  . dofetilide (TIKOSYN) 500 MCG capsule Take 1 capsule (500 mcg total) by mouth 2 (two) times daily. TAKE 1 CAPSULE BY MOUTH 2 TIMES DAILY. - pt needs to keep appt in April for further refills 120 capsule 0  . Glycopyrrolate-Formoterol (BEVESPI AEROSPHERE) 9-4.8 MCG/ACT AERO Inhale 1 puff into the lungs daily as needed (respiratory issues.).     [name] magnesium oxide (MAG-OX) 400 (241.3 Mg) MG tablet Take 1 tablet (400 mg total) by mouth daily. 30 tablet 6  . ondansetron (ZOFRAN ODT) 4 MG disintegrating tablet Take 1 tablet (4 mg total) by mouth every 8 (eight) hours as needed for nausea or vomiting. 20 tablet 0  . oxyCODONE-acetaminophen (PERCOCET/ROXICET) 5-325 MG tablet Take 1 tablet by mouth every  6 (six) hours as needed for severe pain. 16 tablet 0  . metoprolol succinate (TOPROL-XL) 100 MG 24 hr tablet Take 1 tablet (100 mg total) by mouth daily. Take with or immediately following a meal. 30 tablet 3  . rosuvastatin (CRESTOR) 40 MG tablet Take 1 tablet (40 mg total) by mouth daily. 90 tablet 3  . torsemide (DEMADEX) 20 MG tablet Take 1 tablet (20 mg total) by mouth 2 (two) times daily. 30 tablet 3   No  current facility-administered medications for this visit.    Allergies:   Patient has no known allergies.   Social History:  The patient  reports that he has quit smoking. He has a 30.00 pack-year smoking history. He has never used smokeless tobacco. He reports current alcohol use of about 4.0 standard drinks of alcohol per week. He reports that he does not use drugs.   Family History:  The patient's family history includes Atrial fibrillation in his brother; CAD in his brother; COPD in his father and mother.   ROS:  Please see the history of present illness.   Otherwise, review of systems is positive for none.   All other systems are reviewed and negative.   PHYSICAL EXAM: VS:  BP 118/64   Pulse 78   Ht 5\' 10"  (1.778 m)   Wt 255 lb (115.7 kg)   SpO2 92%   BMI 36.59 kg/m  , BMI Body mass index is 36.59 kg/m. GEN: Well nourished, well developed, in no acute distress  HEENT: normal  Neck: no JVD, carotid bruits, or masses Cardiac: RRR; no murmurs, rubs, or gallops,no edema  Respiratory:  clear to auscultation bilaterally, normal work of breathing GI: soft, nontender, nondistended, + BS MS: no deformity or atrophy  Skin: warm and dry Neuro:  Strength and sensation are intact Psych: euthymic mood, full affect  EKG:  EKG is not ordered today. Personal review of the ekg ordered 05/03/19 shows sinus rhythm, rate 86  Recent Labs: 05/03/2019: ALT 43; B Natriuretic Peptide 49.6; BUN 14; Creatinine, Ser 1.00; Hemoglobin 15.7; Platelets 222; Potassium 4.5; Sodium 137    Lipid Panel     Component Value Date/Time   CHOL 183 02/08/2018 1319   TRIG 125 02/08/2018 1319   HDL 47 02/08/2018 1319   CHOLHDL 3.9 02/08/2018 1319   LDLCALC 111 (H) 02/08/2018 1319     Wt Readings from Last 3 Encounters:  05/09/19 255 lb (115.7 kg)  05/03/19 230 lb (104.3 kg)  06/24/18 230 lb (104.3 kg)      Other studies Reviewed: Additional studies/ records that were reviewed today include: outside  cardiology notes   LVEF of 55% documented via echocardiogram on 12/01/2016  Left and right heart catheterization 02/22/2018 1. Medical therapy to prevent progression of coronary artery disease. 2. Diuresis and further evaluation/treatment for suspected obstructive sleep apnea.  ASSESSMENT AND PLAN:  1.  Long-standing persistent atrial fibrillation: Currently on dofetilide and metoprolol.  Currently not anticoagulated due to a CHA2DS2-VASc of 0.  Fortunately he remains in sinus rhythm.  Unfortunately, he is quite fatigued which could be due to his metoprolol.  We Bueford Arp stop metoprolol and start him on Toprol-XL 100 mg to take in the evening.    2. OSA: CPAP compliance encouraged.  3.  Obesity: Diet and exercise encouraged  4.  Alcohol abuse: Cessation encouraged  5.  Shortness of breath: This is possibly due to diastolic heart failure as he was found to be significantly volume overloaded on his left  heart catheterization.  I Elizibeth Breau work on diuresis with 20 mg of torsemide.  We Tajanay Hurley get a basic metabolic and an echo in 10 to 14 days.  This Gared Gillie help assess his overall pressures and his diastolic filling.  Current medicines are reviewed at length with the patient today.   The patient does not have concerns regarding his medicines.  The following changes were made today: Start torsemide  Labs/ tests ordered today include:  Orders Placed This Encounter  Procedures  . ECHOCARDIOGRAM COMPLETE     Disposition:   FU with Jessie Schrieber 3 months  Signed, Anne-Marie Genson Jorja Loa, MD  05/09/2019 11:34 AM     [facility] [address] [place] [address] [phone] (office) [phone] (fax)

## 2019-05-19 ENCOUNTER — Other Ambulatory Visit: Payer: Medicaid Other

## 2019-05-30 ENCOUNTER — Other Ambulatory Visit: Payer: Self-pay | Admitting: Cardiology

## 2019-05-30 ENCOUNTER — Other Ambulatory Visit: Payer: Medicaid Other | Admitting: *Deleted

## 2019-05-30 ENCOUNTER — Other Ambulatory Visit: Payer: Self-pay

## 2019-05-30 ENCOUNTER — Ambulatory Visit (HOSPITAL_COMMUNITY): Payer: Medicaid Other | Attending: Cardiovascular Disease

## 2019-05-30 DIAGNOSIS — I4811 Longstanding persistent atrial fibrillation: Secondary | ICD-10-CM | POA: Diagnosis present

## 2019-05-30 NOTE — Progress Notes (Signed)
Added BMET order for labs as requested in Dr Elberta Fortis note.

## 2019-05-30 NOTE — Addendum Note (Signed)
Addended by: Alois Cliche on: 05/30/2019 12:04 PM   Modules accepted: Orders

## 2019-05-31 ENCOUNTER — Other Ambulatory Visit: Payer: Self-pay | Admitting: Cardiology

## 2019-05-31 LAB — BASIC METABOLIC PANEL
BUN/Creatinine Ratio: 15 (ref 9–20)
BUN: 16 mg/dL (ref 6–24)
CO2: 26 mmol/L (ref 20–29)
Calcium: 9.3 mg/dL (ref 8.7–10.2)
Chloride: 97 mmol/L (ref 96–106)
Creatinine, Ser: 1.04 mg/dL (ref 0.76–1.27)
GFR calc Af Amer: 92 mL/min/{1.73_m2} (ref >59)
GFR calc non Af Amer: 79 mL/min/{1.73_m2} (ref >59)
Glucose: 97 mg/dL (ref 65–99)
Potassium: 4.5 mmol/L (ref 3.5–5.2)
Sodium: 139 mmol/L (ref 134–144)

## 2019-06-02 ENCOUNTER — Other Ambulatory Visit: Payer: Self-pay

## 2019-06-02 MED ORDER — DOFETILIDE 500 MCG PO CAPS: 500.0000 ug | ORAL_CAPSULE | Freq: Two times a day (BID) | ORAL | 3 refills | Status: DC

## 2019-06-02 MED ORDER — METOPROLOL SUCCINATE ER 100 MG PO TB24: 100.0000 mg | ORAL_TABLET | Freq: Every day | ORAL | 3 refills | Status: DC

## 2019-06-02 NOTE — Telephone Encounter (Signed)
Pt's medications were sent to pt's pharmacy as requested. Confirmation received.  

## 2019-06-02 NOTE — Telephone Encounter (Signed)
Outpatient Medication Detail   Disp Refills Start End   dofetilide (TIKOSYN) 500 MCG capsule 180 capsule 3 06/02/2019    Sig - Route: Take 1 capsule (500 mcg total) by mouth 2 (two) times daily. - Oral   Sent to pharmacy as: dofetilide (TIKOSYN) 500 MCG capsule   E-Prescribing Status: Receipt confirmed by pharmacy (06/02/2019 12:23 PM EDT)   Pharmacy  [facility] - [place], [place] - [address]

## 2019-06-03 ENCOUNTER — Telehealth: Payer: Self-pay | Admitting: *Deleted

## 2019-06-03 NOTE — Telephone Encounter (Signed)
Patient will hold torsemide.  He reports he continues to have shortness of breath. Feels it is worsening. No change since starting torsemide. Happens at rest and with exertion. He is asking if he needs to see a lung specialist.

## 2019-06-03 NOTE — Telephone Encounter (Signed)
-----   Message from Will Jorja Loa, MD sent at 06/03/2019  8:17 AM EDT ----- TTE without major abnormality. No volume overload. Ok to hold diuresis for now

## 2019-06-03 NOTE — Telephone Encounter (Signed)
I spoke with patient and reviewed BMP and echo results with him.

## 2019-06-04 NOTE — Telephone Encounter (Signed)
Pt aware to f/u w/ PCP to further evaluate/discuss SOB, per Dr. Elberta Fortis. Pt agreeable to plan.

## 2019-06-06 ENCOUNTER — Telehealth: Payer: Self-pay

## 2019-06-06 NOTE — Telephone Encounter (Signed)
The patient has been notified of the result and verbalized understanding.  All questions (if any) were answered. Leanord Hawking, RN 06/06/2019 8:30 AM

## 2019-06-06 NOTE — Telephone Encounter (Signed)
-----   Message from [name] Jorja Loa, MD sent at 06/05/2019 11:49 AM EDT ----- TTE normal.  Needs to discuss shortness of breath with primary physician.

## 2019-12-12 ENCOUNTER — Other Ambulatory Visit: Payer: Self-pay | Admitting: Nurse Practitioner

## 2019-12-12 ENCOUNTER — Ambulatory Visit
Admission: RE | Admit: 2019-12-12 | Discharge: 2019-12-12 | Disposition: A | Discharge status: Home / Self Care | Payer: Medicaid Other | Source: Ambulatory Visit | Attending: Nurse Practitioner | Admitting: Nurse Practitioner

## 2019-12-12 DIAGNOSIS — F17200 Nicotine dependence, unspecified, uncomplicated: Secondary | ICD-10-CM

## 2019-12-12 DIAGNOSIS — R059 Cough, unspecified: Secondary | ICD-10-CM

## 2020-04-09 IMAGING — CT CT HEART MORP W/ CTA COR W/ SCORE W/ CA W/CM &/OR W/O CM
4 of 7 series · 8 of 20 positions shown, 9 images · non-contrast
Comparison: None.

CLINICAL DATA: 57-year-old male with h/o persistent atrial
fibrillation, alcohol abuse and chest pain.

EXAM:
Cardiac/Coronary  CT
TECHNIQUE: The patient was scanned on a [employer] Force scanner.

[Series 6: best diast 72 % · axial · 0.43mm/px · z∈[+1253,+1308]mm · 2 of 412 slices shown, 3 images]
[im 138/412  vessel]
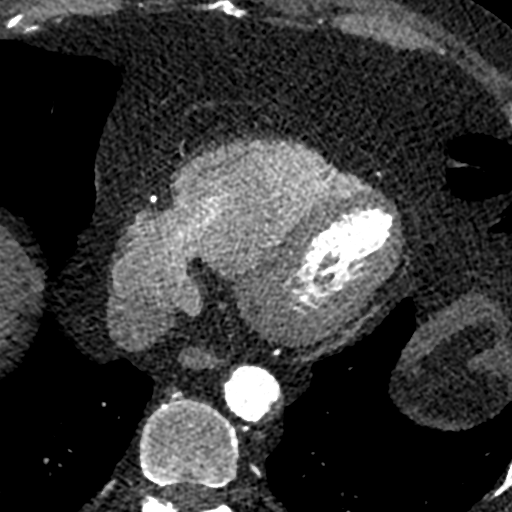
[im 138/412  lung]
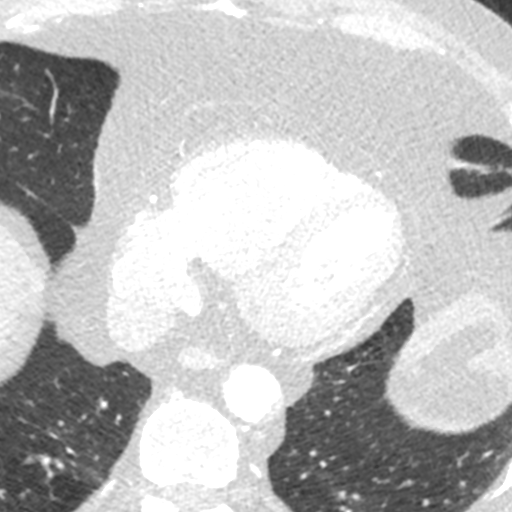
[im 275/412  vessel]
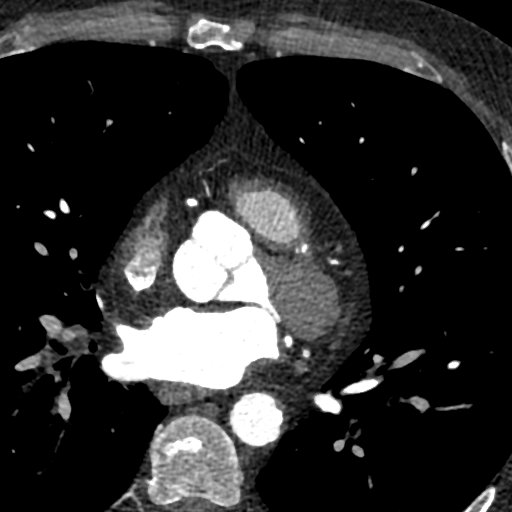

[Series 7: best syst 36 % · axial · 0.43mm/px · z∈[+1253,+1308]mm · 2 of 412 slices shown]
[im 138/412  vessel]
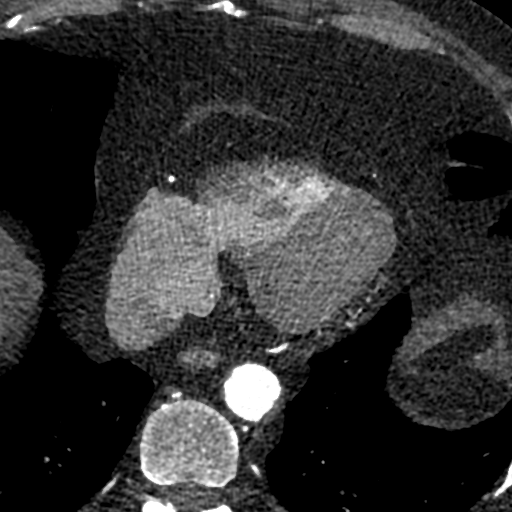
[im 275/412  vessel]
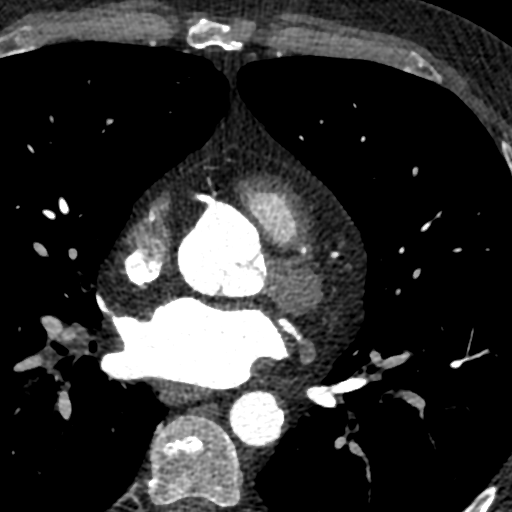

[Series 8: ts diast sharp 72 % · axial · 0.43mm/px · z∈[+1253,+1308]mm · 2 of 412 slices shown]
[im 138/412  lung]
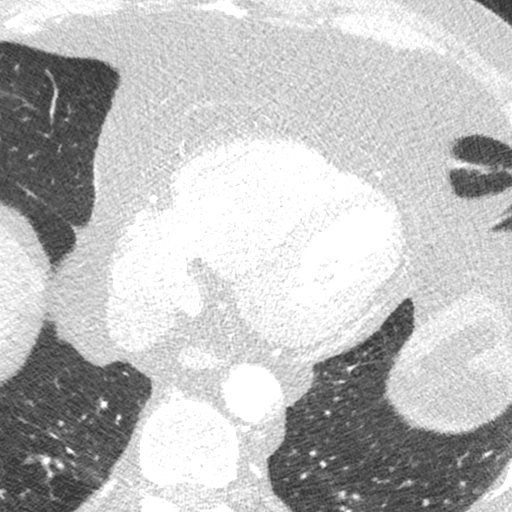
[im 275/412  lung]
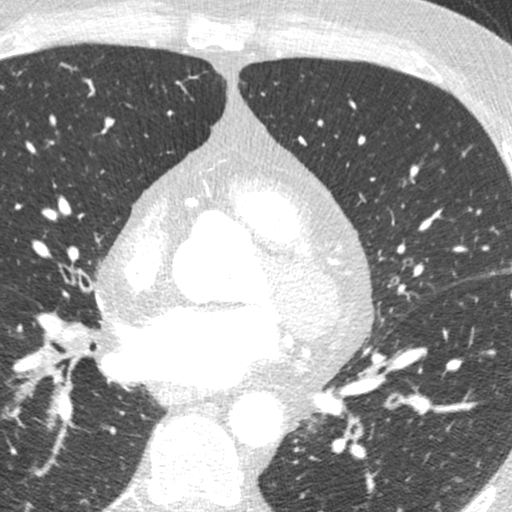

[Series 9: ts syst sharp 36 % · axial · 0.43mm/px · z∈[+1253,+1308]mm · 2 of 412 slices shown]
[im 138/412  lung]
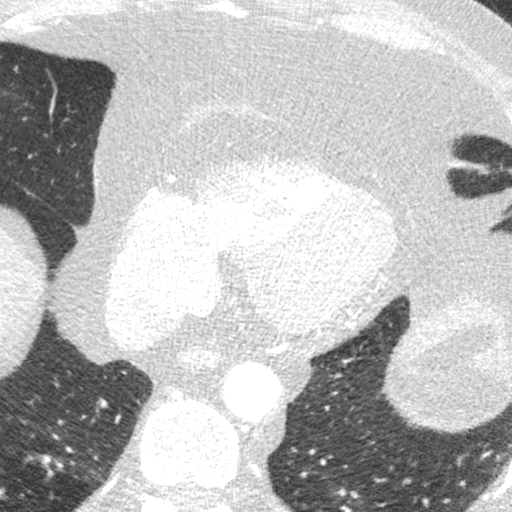
[im 275/412  lung]
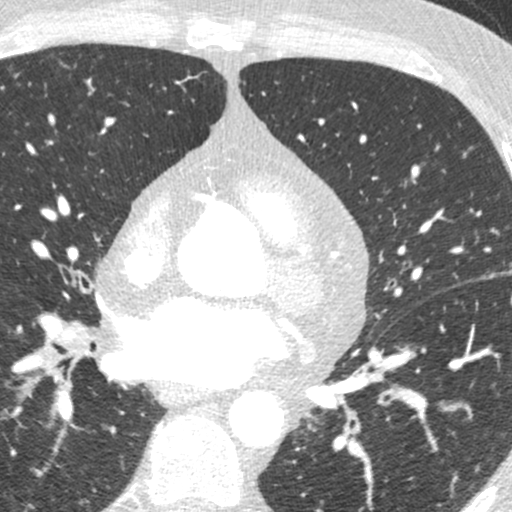

[8 of 20 positions shown; findings below may reference images not displayed]



Aorta: Normal size. Diffuse calcifications in the descending aorta.
No dissection.

Aortic Valve:  Trileaflet.  No calcifications.

Coronary Arteries: Normal coronary origin. Right and left
co-dominance.

RCA is a large dominant artery that gives rise to a small PDA. There
is minimal no-calcified plaque.

Left main is a large artery that gives rise to LAD and LCX arteries.
There is a minimal calcified plaque in the proximal left main with
associated stenosis 0-25%.

LAD is a large vessel that gives rise to one diagonal artery. LAD
has minimal non-calcified plaque.

D1 has a mild calcified plaque with associated stenosis 0-25%.

LCX is a large co-dominant artery that gives rise to two large OM
branches and PLA. There is no significant plaque.

Other findings:

Normal pulmonary vein drainage into the left atrium.

Normal let atrial appendage without a thrombus.

Normal size of the pulmonary artery.
IMPRESSION: 1. Coronary calcium score of 4. This was 47 percentile for age and
sex matched control.

2. Normal coronary origin with right dominance.

3. Minimal non-obstructive CAD. Aggressive risk factor management is
recommended.

EXAM:
OVER-READ INTERPRETATION  CT CHEST

The following report is an over-read performed by radiologist Dr.
Hla Hla Tabar [REDACTED] on 06/27/2017. This
over-read does not include interpretation of cardiac or coronary
anatomy or pathology. The coronary calcium score/coronary CTA
interpretation by the cardiologist is attached.
FINDINGS: Aortic atherosclerosis. Within the visualized portions of the thorax
there are no suspicious appearing pulmonary nodules or masses, there
is no acute consolidative airspace disease, no pleural effusions, no
pneumothorax and no lymphadenopathy. Areas of chronic scarring are
noted in the right middle lobe and inferior segment of the lingula.
Visualized portions of the upper abdomen is demonstrates diffuse low
attenuation throughout the visualized hepatic parenchyma, indicative
of hepatic steatosis. There are no aggressive appearing lytic or
blastic lesions noted in the visualized portions of the skeleton.
IMPRESSION: 1.  Aortic Atherosclerosis ([id number]).
2. Hepatic steatosis.

## 2020-05-24 ENCOUNTER — Other Ambulatory Visit: Payer: Self-pay | Admitting: Cardiology

## 2020-06-25 ENCOUNTER — Other Ambulatory Visit: Payer: Self-pay | Admitting: Cardiology

## 2020-07-09 ENCOUNTER — Other Ambulatory Visit: Payer: Self-pay | Admitting: Cardiology

## 2020-07-23 ENCOUNTER — Other Ambulatory Visit: Payer: Self-pay | Admitting: Cardiology

## 2020-07-29 ENCOUNTER — Other Ambulatory Visit: Payer: Self-pay | Admitting: Cardiology

## 2020-07-29 MED ORDER — METOPROLOL SUCCINATE ER 100 MG PO TB24: ORAL_TABLET | ORAL | 0 refills | Status: DC

## 2020-07-29 MED ORDER — DOFETILIDE 500 MCG PO CAPS: ORAL_CAPSULE | ORAL | 0 refills | Status: DC

## 2020-07-29 NOTE — Telephone Encounter (Signed)
Called [name] [name] to inform him that he is overdue for an yearly appt with Dr Elberta Fortis and that he needed to make an appt first before anymore refills, because it was just sent on 07/09/20, asking for [name] [name] to make overdue appt. [name] [name] stated that he would make an appt with Dr. Elberta Fortis. I informed [name] [name] that when he makes an appt, that we could send in his medications to his pharmacy. [name] [name] verbalized understanding.

## 2020-07-29 NOTE — Telephone Encounter (Signed)
*  STAT* If patient is at the pharmacy, call can be transferred to refill team.   1. Which medications need to be refilled? (please list name of each medication and dose if known) Metoprolol and Dofetilide  2. Which pharmacy/location (including street and city if local pharmacy) is medication to be sent to? [facility], [place]  3. Do they need a 30 day or 90 day supply? 990 days and refills

## 2020-07-29 NOTE — Telephone Encounter (Signed)
Patient is scheduled 01/21/2021.

## 2020-07-29 NOTE — Telephone Encounter (Signed)
Pt has an upcoming appt on 01/21/21 with Dr. Elberta Fortis. Pt is overdue to be seen. Would Dr. Elberta Fortis like to refill these medication until appt or does pt need to be seen soon? Please address

## 2020-07-29 NOTE — Telephone Encounter (Signed)
Called and spoke to pt. Aware will send in 3 month supply of Tikosyn & Metoprolol, for now.  Will further discuss options with Dr. Elberta Fortis as pt can only get to office on a Friday for appointments.

## 2020-07-30 ENCOUNTER — Telehealth: Payer: Self-pay | Admitting: Cardiology

## 2020-07-30 DIAGNOSIS — M542 Cervicalgia: Secondary | ICD-10-CM | POA: Insufficient documentation

## 2020-07-30 DIAGNOSIS — M47812 Spondylosis without myelopathy or radiculopathy, cervical region: Secondary | ICD-10-CM | POA: Insufficient documentation

## 2020-07-30 DIAGNOSIS — M545 Low back pain, unspecified: Secondary | ICD-10-CM | POA: Insufficient documentation

## 2020-07-30 NOTE — Telephone Encounter (Signed)
Pt is returning a call  

## 2020-08-12 ENCOUNTER — Encounter: Payer: Self-pay | Admitting: Cardiology

## 2020-08-12 NOTE — Telephone Encounter (Signed)
error 

## 2020-09-06 ENCOUNTER — Other Ambulatory Visit: Payer: Self-pay

## 2020-09-06 ENCOUNTER — Encounter: Payer: Self-pay | Admitting: Cardiology

## 2020-09-06 ENCOUNTER — Ambulatory Visit (INDEPENDENT_AMBULATORY_CARE_PROVIDER_SITE_OTHER): Payer: Medicaid Other | Admitting: Cardiology

## 2020-09-06 VITALS — BP 110/70 | HR 80 | Ht 70.0 in | Wt 266.0 lb

## 2020-09-06 DIAGNOSIS — Z79899 Other long term (current) drug therapy: Secondary | ICD-10-CM

## 2020-09-06 DIAGNOSIS — I4811 Longstanding persistent atrial fibrillation: Secondary | ICD-10-CM | POA: Diagnosis not present

## 2020-09-06 NOTE — Progress Notes (Signed)
Electrophysiology Office Note   Date:  09/06/2020   ID:  [name] [name], DOB 02-24-1960, MRN [id number]  PCP:  Kaleen Mask, MD  Cardiologist:  Donnie Aho Primary Electrophysiologist:  Dr Elberta Fortis    CC: Follow up for atrial fibrilaltion   History of Present Illness: [name] [name] is a 60 y.o. male who is being seen today for the evaluation of atrial fibrillation at the request of [name] [name]. Presenting today for electrophysiology evaluation.    He has a history significant for GERD, COPD, obesity, and atrial fibrillation.  He had attempted cardioversion 12/22/2016 but was not able to convert to sinus rhythm.  He had potentially been in atrial fibrillation for 3 to 4 years.  He had mild left atrial enlargement.  He was admitted to the hospital for dofetilide loading.  He had a sleep study was found to have obstructive sleep apnea.  Today, denies symptoms of palpitations, chest pain,  orthopnea, PND, lower extremity edema, claudication, dizziness, presyncope, syncope, bleeding, or neurologic sequela. The patient is tolerating medications without difficulties.  He has been having shortness of breath and fatigue over the past few months.  He feels well today, and his ECG is in sinus rhythm.  He feels that he might of gone back into atrial fibrillation as a cause for his shortness of breath.  He has significant lower extremity edema.  His primary physician has been adjusting his diuretics.   Past Medical History:  Diagnosis Date   Chronic diastolic CHF (congestive heart failure) (HCC)    COPD (chronic obstructive pulmonary disease) (HCC)    Hypertension    Obesity (BMI 30-39.9) 12/15/2016   OSA (obstructive sleep apnea)    Persistent atrial fibrillation (HCC) 12/15/2016   Visit for monitoring Tikosyn therapy    Past Surgical History:  Procedure Laterality Date   CARDIAC CATHETERIZATION  02/22/2018   CARDIOVERSION N/A 12/22/2016   Procedure: CARDIOVERSION;  Surgeon: Othella Boyer, MD;  Location: [facility] ENDOSCOPY;  Service: Cardiovascular;  Laterality: N/A;   HEMORROIDECTOMY     HERNIA REPAIR     RIGHT/LEFT HEART CATH AND CORONARY ANGIOGRAPHY N/A 02/22/2018   Procedure: RIGHT/LEFT HEART CATH AND CORONARY ANGIOGRAPHY;  Surgeon: Yvonne Kendall, MD;  Location: [facility] INVASIVE CV LAB;  Service: Cardiovascular;  Laterality: N/A;     Current Outpatient Medications  Medication Sig Dispense Refill   dofetilide (TIKOSYN) 500 MCG capsule TAKE 1 CAPSULE BY MOUTH 2 TIMES DAILY. Please keep upcoming appt. Thanks 180 capsule 0   furosemide (LASIX) 20 MG tablet Take 20 mg by mouth daily.     Glycopyrrolate-Formoterol 9-4.8 MCG/ACT AERO Inhale 1 puff into the lungs daily as needed (respiratory issues.).      magnesium oxide (MAG-OX) 400 (241.3 Mg) MG tablet Take 1 tablet (400 mg total) by mouth daily. 30 tablet 6   metoprolol succinate (TOPROL-XL) 100 MG 24 hr tablet TAKE 1 TABLET BY MOUTH DAILY. Please keep upcoming appt. Thanks 90 tablet 0   ondansetron (ZOFRAN ODT) 4 MG disintegrating tablet Take 1 tablet (4 mg total) by mouth every 8 (eight) hours as needed for nausea or vomiting. 20 tablet 0   oxyCODONE-acetaminophen (PERCOCET/ROXICET) 5-325 MG tablet Take 1 tablet by mouth every 6 (six) hours as needed for severe pain. 16 tablet 0   torsemide (DEMADEX) 20 MG tablet Take 1 tablet (20 mg total) by mouth 2 (two) times daily. (Patient not taking: Reported on 09/06/2020) 30 tablet 3   No current facility-administered medications for this visit.  Allergies:   Patient has no known allergies.   Social History:  The patient  reports that he has quit smoking. He has a 30.00 pack-year smoking history. He has never used smokeless tobacco. He reports current alcohol use of about 4.0 standard drinks of alcohol per week. He reports that he does not use drugs.   Family History:  The patient's family history includes Atrial fibrillation in his brother; CAD in his brother; COPD in his father  and mother.   ROS:  Please see the history of present illness.   Otherwise, review of systems is positive for none.   All other systems are reviewed and negative.   PHYSICAL EXAM: VS:  BP 110/70   Pulse 80   Ht 5\' 10"  (1.778 m)   Wt 266 lb (120.7 kg)   BMI 38.17 kg/m  , BMI Body mass index is 38.17 kg/m. GEN: Well nourished, well developed, in no acute distress  HEENT: normal  Neck: no JVD, carotid bruits, or masses Cardiac: RRR; no murmurs, rubs, or gallops,2+ edema  Respiratory:  clear to auscultation bilaterally, normal work of breathing GI: soft, nontender, nondistended, + BS MS: no deformity or atrophy  Skin: warm and dry Neuro:  Strength and sensation are intact Psych: euthymic mood, full affect  EKG:  EKG is ordered today. Personal review of the ekg ordered shows sinus rhythm, rate 80, QTc 479 ms  Recent Labs: No results found for requested labs within last 8760 hours.    Lipid Panel     Component Value Date/Time   CHOL 183 02/08/2018 1319   TRIG 125 02/08/2018 1319   HDL 47 02/08/2018 1319   CHOLHDL 3.9 02/08/2018 1319   LDLCALC 111 (H) 02/08/2018 1319     Wt Readings from Last 3 Encounters:  09/06/20 266 lb (120.7 kg)  05/09/19 255 lb (115.7 kg)  05/03/19 230 lb (104.3 kg)      Other studies Reviewed: Additional studies/ records that were reviewed today include: TTE 05/30/19  1. Left ventricular ejection fraction, by estimation, is 60 to 65%. The  left ventricle has normal function. The left ventricle has no regional  wall motion abnormalities. Left ventricular diastolic parameters were  normal.   2. Right ventricular systolic function is normal. The right ventricular  size is normal.   3. Left atrial size was mildly dilated.   4. The mitral valve is normal in structure. No evidence of mitral valve  regurgitation. No evidence of mitral stenosis.   5. The aortic valve is normal in structure. Aortic valve regurgitation is  not visualized. No aortic  stenosis is present.   6. The inferior vena cava is normal in size with greater than 50%  respiratory variability, suggesting right atrial pressure of 3 mmHg.   ASSESSMENT AND PLAN:  1.  Longstanding persistent atrial fibrillation: Currently on dofetilide and metoprolol.  Not anticoagulated with a CHA2DS2-VASc of 0.  He feels that he may have gone back into atrial fibrillation at 1 point due to shortness of breath.  He also has significant lower extremity edema.  I have offered him Linq monitor implant to monitor his atrial fibrillation.  He would like to see if treating his edema Amilya Haver improve his shortness of breath.  We Lanny Lipkin increase his Lasix to 40 mg daily for the next week and then go down to 20 mg a day.  He Jerrod Damiano follow-up in clinic to assess his edema.  2.  Obstructive sleep apnea: CPAP compliance encouraged  3.  Obesity: Diet and exercise encouraged  4.  Alcohol abuse: Complete cessation encouraged   Current medicines are reviewed at length with the patient today.   The patient does not have concerns regarding his medicines.  The following changes were made today: Increase Lasix   Labs/ tests ordered today include:  Orders Placed This Encounter  Procedures   Basic metabolic panel   Magnesium   EKG 12-Lead      Disposition:   FU with Eneida Evers 6 months  Signed, Posie Lillibridge Jorja Loa, MD  09/06/2020 3:46 PM     [facility] [address] [place] [address] [phone] (office) [phone] (fax)

## 2020-09-06 NOTE — Patient Instructions (Signed)
Medication Instructions:  Your physician has recommended you make the following change in your medication:  TAKE Lasix 40 mg daily for 7 days, then return to normal dosing of 20 mg daily.  *If you need a refill on your cardiac medications before your next appointment, please call your pharmacy*   Lab Work: Today: BMET & Magnesium If you have labs (blood work) drawn today and your tests are completely normal, you will receive your results only by: MyChart Message (if you have MyChart) OR A paper copy in the mail If you have any lab test that is abnormal or we need to change your treatment, we will call you to review the results.   Testing/Procedures: None ordered   Follow-Up: At [facility], you and your health needs are our priority.  As part of our continuing mission to provide you with exceptional heart care, we have created designated Provider Care Teams.  These Care Teams include your primary Cardiologist (physician) and Advanced Practice Providers (APPs -  Physician Assistants and Nurse Practitioners) who all work together to provide you with the care you need, when you need it.  We recommend signing up for the patient portal called "MyChart".  Sign up information is provided on this After Visit Summary.  MyChart is used to connect with patients for Virtual Visits (Telemedicine).  Patients are able to view lab/test results, encounter notes, upcoming appointments, etc.  Non-urgent messages can be sent to your provider as well.   To learn more about what you can do with MyChart, go to [email or web address].    Your next appointment:   3 week(s)  The format for your next appointment:   In Person  Provider:   You will see one of the following Advanced Practice Providers on your designated Care Team:   Francis Dowse, [place] "Mardelle Matte" [place], PA-C  Then, Will Jorja Loa, MD will plan to see you again in 6 month(s).   Thank you for choosing [facility]!!   Dory Horn, RN [phone]    Other Instructions

## 2020-09-07 LAB — BASIC METABOLIC PANEL
BUN/Creatinine Ratio: 14 (ref 9–20)
BUN: 14 mg/dL (ref 6–24)
CO2: 27 mmol/L (ref 20–29)
Calcium: 9 mg/dL (ref 8.7–10.2)
Chloride: 101 mmol/L (ref 96–106)
Creatinine, Ser: 1.02 mg/dL (ref 0.76–1.27)
Glucose: 81 mg/dL (ref 65–99)
Potassium: 4.1 mmol/L (ref 3.5–5.2)
Sodium: 142 mmol/L (ref 134–144)
eGFR: 85 mL/min/{1.73_m2} (ref >59)

## 2020-09-07 LAB — MAGNESIUM: Magnesium: 2 mg/dL (ref 1.6–2.3)

## 2020-09-14 ENCOUNTER — Telehealth: Payer: Self-pay | Admitting: Cardiology

## 2020-09-14 NOTE — Telephone Encounter (Signed)
Patient retuning call from the office regarding lab results. The patient states he does not have a voicemail from the office

## 2020-09-14 NOTE — Telephone Encounter (Signed)
Pt aware of lab results . Stable labs /cy

## 2020-09-22 ENCOUNTER — Telehealth: Payer: Self-pay

## 2020-09-22 NOTE — Telephone Encounter (Signed)
Pt requesting 1 weeks of extra Lasix to be sent to pharmacy, aware I will send in Rx.  Pt also mentions that Dr. Elberta Fortis discussed with him restarting his Crestor. Aware I will forward to MD for advisement as nothing is noted in OV. Aware I will call once advised on, pt agreeable to plan.

## 2020-09-30 NOTE — Progress Notes (Deleted)
PCP:  Kaleen Mask, MD Primary Cardiologist: Will Jorja Loa, MD Electrophysiologist: Regan Lemming, MD   [name] [name] is a 60 y.o. male seen today for Will Jorja Loa, MD for routine electrophysiology followup.    At last visit noted to have volume overload with increased SOB.  Since last being seen in our clinic the patient reports doing ***.  he denies chest pain, palpitations, dyspnea, PND, orthopnea, nausea, vomiting, dizziness, syncope, edema, weight gain, or early satiety.  Past Medical History:  Diagnosis Date   Chronic diastolic CHF (congestive heart failure) (HCC)    COPD (chronic obstructive pulmonary disease) (HCC)    Hypertension    Obesity (BMI 30-39.9) 12/15/2016   OSA (obstructive sleep apnea)    Persistent atrial fibrillation (HCC) 12/15/2016   Visit for monitoring Tikosyn therapy    Past Surgical History:  Procedure Laterality Date   CARDIAC CATHETERIZATION  02/22/2018   CARDIOVERSION N/A 12/22/2016   Procedure: CARDIOVERSION;  Surgeon: Othella Boyer, MD;  Location[facility] ENDOSCOPY;  Service: Cardiovascular;  Laterality: N/A;   HEMORROIDECTOMY     HERNIA REPAIR     RIGHT/LEFT HEART CATH AND CORONARY ANGIOGRAPHY N/A 02/22/2018   Procedure: RIGHT/LEFT HEART CATH AND CORONARY ANGIOGRAPHY;  Surgeon: Yvonne Kendall, MD;  Location: [facility] INVASIVE CV LAB;  Service: Cardiovascular;  Laterality: N/A;    Current Outpatient Medications  Medication Sig Dispense Refill   dofetilide (TIKOSYN) 500 MCG capsule TAKE 1 CAPSULE BY MOUTH 2 TIMES DAILY. Please keep upcoming appt. Thanks 180 capsule 0   furosemide (LASIX) 20 MG tablet Take 20 mg by mouth daily.     Glycopyrrolate-Formoterol 9-4.8 MCG/ACT AERO Inhale 1 puff into the lungs daily as needed (respiratory issues.).      magnesium oxide (MAG-OX) 400 (241.3 Mg) MG tablet Take 1 tablet (400 mg total) by mouth daily. 30 tablet 6   metoprolol succinate (TOPROL-XL) 100 MG 24 hr tablet TAKE 1 TABLET  BY MOUTH DAILY. Please keep upcoming appt. Thanks 90 tablet 0   ondansetron (ZOFRAN ODT) 4 MG disintegrating tablet Take 1 tablet (4 mg total) by mouth every 8 (eight) hours as needed for nausea or vomiting. 20 tablet 0   oxyCODONE-acetaminophen (PERCOCET/ROXICET) 5-325 MG tablet Take 1 tablet by mouth every 6 (six) hours as needed for severe pain. 16 tablet 0   torsemide (DEMADEX) 20 MG tablet Take 1 tablet (20 mg total) by mouth 2 (two) times daily. (Patient not taking: Reported on 09/06/2020) 30 tablet 3   No current facility-administered medications for this visit.    No Known Allergies  Social History   Socioeconomic History   Marital status: Divorced    Spouse name: Not on file   Number of children: Not on file   Years of education: Not on file   Highest education level: Not on file  Occupational History   Not on file  Tobacco Use   Smoking status: Former    Packs/day: 1.00    Years: 30.00    Pack years: 30.00    Types: Cigarettes   Smokeless tobacco: Never  Vaping Use   Vaping Use: Never used  Substance and Sexual Activity   Alcohol use: Yes    Alcohol/week: 4.0 standard drinks    Types: 4 Cans of beer per week   Drug use: No   Sexual activity: Not on file  Other Topics Concern   Not on file  Social History Narrative   Not on file   Social Determinants of  Health   Financial Resource Strain: Not on file  Food Insecurity: Not on file  Transportation Needs: Not on file  Physical Activity: Not on file  Stress: Not on file  Social Connections: Not on file  Intimate Partner Violence: Not on file     Review of Systems: General: No chills, fever, night sweats or weight changes  Cardiovascular:  No chest pain, dyspnea on exertion, edema, orthopnea, palpitations, paroxysmal nocturnal dyspnea Dermatological: No rash, lesions or masses Respiratory: No cough, dyspnea Urologic: No hematuria, dysuria Abdominal: No nausea, vomiting, diarrhea, bright red blood per  rectum, melena, or hematemesis Neurologic: No visual changes, weakness, changes in mental status All other systems reviewed and are otherwise negative except as noted above.  Physical Exam: There were no vitals filed for this visit.  GEN- The patient is well appearing, alert and oriented x 3 today.   HEENT: normocephalic, atraumatic; sclera clear, conjunctiva pink; hearing intact; oropharynx clear; neck supple, no JVP Lymph- no cervical lymphadenopathy Lungs- Clear to ausculation bilaterally, normal work of breathing.  No wheezes, rales, rhonchi Heart- Regular rate and rhythm, no murmurs, rubs or gallops, PMI not laterally displaced GI- soft, non-tender, non-distended, bowel sounds present, no hepatosplenomegaly Extremities- no clubbing, cyanosis, or edema; DP/PT/radial pulses 2+ bilaterally MS- no significant deformity or atrophy Skin- warm and dry, no rash or lesion Psych- euthymic mood, full affect Neuro- strength and sensation are intact  EKG {ACTION; IS/IS [id number]} ordered. Personal review of EKG from {Blank single:19197::"today","***"} shows ***  Additional studies reviewed include: Previous EP office notes. ***  Assessment and Plan:  1.  Longstanding persistent atrial fibrillation:  Continue tikosyn and metoprolol Not on OAC with CHA2DS2-VASc of at least 0 .  2. Chronic diastolic CHF Volume status ***  Continue lasix 20 mg daily.   3. OSA Encourage nightly CPAP   4. Obesity There is no height or weight on file to calculate BMI.  Encouraged lifestyle modification   5.  Alcohol abuse:  Encourage complete cessation   Luane School  09/30/20 2:39 PM

## 2020-10-01 ENCOUNTER — Ambulatory Visit: Payer: Medicaid Other | Admitting: Student

## 2020-10-01 DIAGNOSIS — I4819 Other persistent atrial fibrillation: Secondary | ICD-10-CM

## 2020-10-01 DIAGNOSIS — I5031 Acute diastolic (congestive) heart failure: Secondary | ICD-10-CM

## 2020-10-01 DIAGNOSIS — Z79899 Other long term (current) drug therapy: Secondary | ICD-10-CM

## 2020-10-01 DIAGNOSIS — I4811 Longstanding persistent atrial fibrillation: Secondary | ICD-10-CM

## 2020-10-14 NOTE — Progress Notes (Deleted)
PCP:  Kaleen Mask, MD Primary Cardiologist: Will Jorja Loa, MD Electrophysiologist: Will Jorja Loa, MD   [name] [name] is a 60 y.o. male seen today for Will Jorja Loa, MD for routine electrophysiology followup to reassess his edema.  Since last being seen in our clinic the patient reports doing ***.  he denies chest pain, palpitations, dyspnea, PND, orthopnea, nausea, vomiting, dizziness, syncope, edema, weight gain, or early satiety.  Past Medical History:  Diagnosis Date   Chronic diastolic CHF (congestive heart failure) (HCC)    COPD (chronic obstructive pulmonary disease) (HCC)    Hypertension    Obesity (BMI 30-39.9) 12/15/2016   OSA (obstructive sleep apnea)    Persistent atrial fibrillation (HCC) 12/15/2016   Visit for monitoring Tikosyn therapy    Past Surgical History:  Procedure Laterality Date   CARDIAC CATHETERIZATION  02/22/2018   CARDIOVERSION N/A 12/22/2016   Procedure: CARDIOVERSION;  Surgeon: Othella Boyer, MD;  Location: [facility] ENDOSCOPY;  Service: Cardiovascular;  Laterality: N/A;   HEMORROIDECTOMY     HERNIA REPAIR     RIGHT/LEFT HEART CATH AND CORONARY ANGIOGRAPHY N/A 02/22/2018   Procedure: RIGHT/LEFT HEART CATH AND CORONARY ANGIOGRAPHY;  Surgeon: Yvonne Kendall, MD;  Location: [facility] INVASIVE CV LAB;  Service: Cardiovascular;  Laterality: N/A;    Current Outpatient Medications  Medication Sig Dispense Refill   dofetilide (TIKOSYN) 500 MCG capsule TAKE 1 CAPSULE BY MOUTH 2 TIMES DAILY. Please keep upcoming appt. Thanks 180 capsule 0   furosemide (LASIX) 20 MG tablet Take 20 mg by mouth daily.     Glycopyrrolate-Formoterol 9-4.8 MCG/ACT AERO Inhale 1 puff into the lungs daily as needed (respiratory issues.).      magnesium oxide (MAG-OX) 400 (241.3 Mg) MG tablet Take 1 tablet (400 mg total) by mouth daily. 30 tablet 6   metoprolol succinate (TOPROL-XL) 100 MG 24 hr tablet TAKE 1 TABLET BY MOUTH DAILY. Please keep upcoming appt. Thanks  90 tablet 0   ondansetron (ZOFRAN ODT) 4 MG disintegrating tablet Take 1 tablet (4 mg total) by mouth every 8 (eight) hours as needed for nausea or vomiting. 20 tablet 0   oxyCODONE-acetaminophen (PERCOCET/ROXICET) 5-325 MG tablet Take 1 tablet by mouth every 6 (six) hours as needed for severe pain. 16 tablet 0   torsemide (DEMADEX) 20 MG tablet Take 1 tablet (20 mg total) by mouth 2 (two) times daily. (Patient not taking: Reported on 09/06/2020) 30 tablet 3   No current facility-administered medications for this visit.    No Known Allergies  Social History   Socioeconomic History   Marital status: Divorced    Spouse name: Not on file   Number of children: Not on file   Years of education: Not on file   Highest education level: Not on file  Occupational History   Not on file  Tobacco Use   Smoking status: Former    Packs/day: 1.00    Years: 30.00    Pack years: 30.00    Types: Cigarettes   Smokeless tobacco: Never  Vaping Use   Vaping Use: Never used  Substance and Sexual Activity   Alcohol use: Yes    Alcohol/week: 4.0 standard drinks    Types: 4 Cans of beer per week   Drug use: No   Sexual activity: Not on file  Other Topics Concern   Not on file  Social History Narrative   Not on file   Social Determinants of Health   Financial Resource Strain: Not on file  Food Insecurity: Not on file  Transportation Needs: Not on file  Physical Activity: Not on file  Stress: Not on file  Social Connections: Not on file  Intimate Partner Violence: Not on file     Review of Systems: All other systems reviewed and are otherwise negative except as noted above.  Physical Exam: There were no vitals filed for this visit.  GEN- The patient is well appearing, alert and oriented x 3 today.   HEENT: normocephalic, atraumatic; sclera clear, conjunctiva pink; hearing intact; oropharynx clear; neck supple, no JVP Lymph- no cervical lymphadenopathy Lungs- Clear to ausculation  bilaterally, normal work of breathing.  No wheezes, rales, rhonchi Heart- Regular rate and rhythm, no murmurs, rubs or gallops, PMI not laterally displaced GI- soft, non-tender, non-distended, bowel sounds present, no hepatosplenomegaly Extremities- no clubbing, cyanosis, or edema; DP/PT/radial pulses 2+ bilaterally MS- no significant deformity or atrophy Skin- warm and dry, no rash or lesion Psych- euthymic mood, full affect Neuro- strength and sensation are intact  EKG is not ordered.   Additional studies reviewed include: Previous EP office notes.   Assessment and Plan:  1. Longstanding persistent atrial fibrillation Currently on tikosyn and metoprolol  Not anticoagulated with CHA2DS2-VASc of 0 Have previously offered LINQ monitoring to further assess AF burden   2. OSA Encouraged nightly CPAP use  3. Obesity Encouraged lifestyle modification  4. Chronic diastolic CHF *** edema on lasix 20 mg daily.  [name], PA-C  10/14/20 1:51 PM

## 2020-10-15 ENCOUNTER — Ambulatory Visit: Payer: Medicaid Other | Admitting: Student

## 2020-10-15 DIAGNOSIS — Z79899 Other long term (current) drug therapy: Secondary | ICD-10-CM

## 2020-10-15 DIAGNOSIS — I4811 Longstanding persistent atrial fibrillation: Secondary | ICD-10-CM

## 2020-10-15 DIAGNOSIS — I5032 Chronic diastolic (congestive) heart failure: Secondary | ICD-10-CM

## 2020-10-27 ENCOUNTER — Other Ambulatory Visit: Payer: Self-pay | Admitting: Cardiology

## 2020-11-26 ENCOUNTER — Telehealth: Payer: Self-pay | Admitting: *Deleted

## 2020-11-26 DIAGNOSIS — I700 Atherosclerosis of aorta: Secondary | ICD-10-CM

## 2020-11-26 NOTE — Telephone Encounter (Signed)
Followed up with pt on obtaining fasting lipid profile lab work. Pt will stop by the office 11/4 for blood work. Aware will determine if need to restart Crestor based on result findings. Patient verbalized understanding and agreeable to plan.

## 2020-12-10 ENCOUNTER — Other Ambulatory Visit: Payer: Medicaid Other

## 2021-01-21 ENCOUNTER — Ambulatory Visit: Payer: Medicaid Other | Admitting: Cardiology

## 2021-02-25 ENCOUNTER — Ambulatory Visit: Payer: Medicaid Other | Admitting: Cardiology

## 2021-02-25 NOTE — Progress Notes (Deleted)
Electrophysiology Office Note   Date:  02/25/2021   ID:  [name] [name], DOB [birth year], MRN [id number]  PCP:  Kaleen Mask, MD  Cardiologist:  Donnie Aho Primary Electrophysiologist:  Dr Elberta Fortis    CC: Follow up for atrial fibrilaltion   History of Present Illness: [name] [name] is a 61 y.o. male who is being seen today for the evaluation of atrial fibrillation at the request of [name]. Presenting today for electrophysiology evaluation.    He has a history significant for GERD, COPD, obesity, atrial fibrillation.  He had attempted cardioversion in 2018 but was unable to convert to sinus rhythm.  He has been in atrial fibrillation since that time.  He has mild left atrial enlargement.  He was admitted to the hospital for dofetilide loading.  He also had a sleep study and was found to have obstructive sleep apnea.  Today, denies symptoms of palpitations, chest pain, shortness of breath, orthopnea, PND, lower extremity edema, claudication, dizziness, presyncope, syncope, bleeding, or neurologic sequela. The patient is tolerating medications without difficulties. ***   Past Medical History:  Diagnosis Date   Chronic diastolic CHF (congestive heart failure) (HCC)    COPD (chronic obstructive pulmonary disease) (HCC)    Hypertension    Obesity (BMI 30-39.9) 12/15/2016   OSA (obstructive sleep apnea)    Persistent atrial fibrillation (HCC) 12/15/2016   Visit for monitoring Tikosyn therapy    Past Surgical History:  Procedure Laterality Date   CARDIAC CATHETERIZATION  02/22/2018   CARDIOVERSION N/A 12/22/2016   Procedure: CARDIOVERSION;  Surgeon: Othella Boyer, MD;  Location: [facility] ENDOSCOPY;  Service: Cardiovascular;  Laterality: N/A;   HEMORROIDECTOMY     HERNIA REPAIR     RIGHT/LEFT HEART CATH AND CORONARY ANGIOGRAPHY N/A 02/22/2018   Procedure: RIGHT/LEFT HEART CATH AND CORONARY ANGIOGRAPHY;  Surgeon: Yvonne Kendall, MD;  Location: [facility] INVASIVE CV LAB;  Service:  Cardiovascular;  Laterality: N/A;     Current Outpatient Medications  Medication Sig Dispense Refill   dofetilide (TIKOSYN) 500 MCG capsule TAKE 1 CAPSULE BY MOUTH 2 TIMES DAILY. 180 capsule 3   furosemide (LASIX) 20 MG tablet Take 20 mg by mouth daily.     Glycopyrrolate-Formoterol 9-4.8 MCG/ACT AERO Inhale 1 puff into the lungs daily as needed (respiratory issues.).      magnesium oxide (MAG-OX) 400 (241.3 Mg) MG tablet Take 1 tablet (400 mg total) by mouth daily. 30 tablet 6   metoprolol succinate (TOPROL-XL) 100 MG 24 hr tablet TAKE 1 TABLET BY MOUTH DAILY. 90 tablet 3   ondansetron (ZOFRAN ODT) 4 MG disintegrating tablet Take 1 tablet (4 mg total) by mouth every 8 (eight) hours as needed for nausea or vomiting. 20 tablet 0   oxyCODONE-acetaminophen (PERCOCET/ROXICET) 5-325 MG tablet Take 1 tablet by mouth every 6 (six) hours as needed for severe pain. 16 tablet 0   torsemide (DEMADEX) 20 MG tablet Take 1 tablet (20 mg total) by mouth 2 (two) times daily. (Patient not taking: Reported on 09/06/2020) 30 tablet 3   No current facility-administered medications for this visit.    Allergies:   Patient has no known allergies.   Social History:  The patient  reports that he has quit smoking. He has a 30.00 pack-year smoking history. He has never used smokeless tobacco. He reports current alcohol use of about 4.0 standard drinks per week. He reports that he does not use drugs.   Family History:  The patient's family history includes Atrial fibrillation in his  brother; CAD in his brother; COPD in his father and mother.   ROS:  Please see the history of present illness.   Otherwise, review of systems is positive for none.   All other systems are reviewed and negative.   PHYSICAL EXAM: VS:  There were no vitals taken for this visit. , BMI There is no height or weight on file to calculate BMI. GEN: Well nourished, well developed, in no acute distress  HEENT: normal  Neck: no JVD, carotid bruits,  or masses Cardiac: ***RRR; no murmurs, rubs, or gallops,no edema  Respiratory:  clear to auscultation bilaterally, normal work of breathing GI: soft, nontender, nondistended, + BS MS: no deformity or atrophy  Skin: warm and dry Neuro:  Strength and sensation are intact Psych: euthymic mood, full affect  EKG:  EKG {ACTION; IS/IS JHE:[id number]} ordered today. Personal review of the ekg ordered *** shows ***   Recent Labs: 09/06/2020: BUN 14; Creatinine, Ser 1.02; Magnesium 2.0; Potassium 4.1; Sodium 142    Lipid Panel     Component Value Date/Time   CHOL 183 02/08/2018 1319   TRIG 125 02/08/2018 1319   HDL 47 02/08/2018 1319   CHOLHDL 3.9 02/08/2018 1319   LDLCALC 111 (H) 02/08/2018 1319     Wt Readings from Last 3 Encounters:  09/06/20 266 lb (120.7 kg)  05/09/19 255 lb (115.7 kg)  05/03/19 230 lb (104.3 kg)      Other studies Reviewed: Additional studies/ records that were reviewed today include: TTE 05/30/19  1. Left ventricular ejection fraction, by estimation, is 60 to 65%. The  left ventricle has normal function. The left ventricle has no regional  wall motion abnormalities. Left ventricular diastolic parameters were  normal.   2. Right ventricular systolic function is normal. The right ventricular  size is normal.   3. Left atrial size was mildly dilated.   4. The mitral valve is normal in structure. No evidence of mitral valve  regurgitation. No evidence of mitral stenosis.   5. The aortic valve is normal in structure. Aortic valve regurgitation is  not visualized. No aortic stenosis is present.   6. The inferior vena cava is normal in size with greater than 50%  respiratory variability, suggesting right atrial pressure of 3 mmHg.   ASSESSMENT AND PLAN:  1.  Longstanding persistent atrial fibrillation: Currently on dofetilide and metoprolol.  Not anticoagulated with CHA2DS2-VASc of 0.***  2.  Obstructive sleep apnea: CPAP compliance encouraged  3.  Obesity:  There is no height or weight on file to calculate BMI. Diet and exercise encouraged  4.  Alcohol abuse: Complete cessation encouraged  Current medicines are reviewed at length with the patient today.   The patient does not have concerns regarding his medicines.  The following changes were made today: ***  Labs/ tests ordered today include:  No orders of the defined types were placed in this encounter.     Disposition:   FU with Kennette Cuthrell *** months  Signed, Masiah Woody Jorja Loa, MD  02/25/2021 2:04 PM     [facility] [address] [place] [address] [phone] (office) [phone] (fax)

## 2021-06-25 ENCOUNTER — Emergency Department (HOSPITAL_BASED_OUTPATIENT_CLINIC_OR_DEPARTMENT_OTHER): Payer: Medicaid Other

## 2021-06-25 ENCOUNTER — Other Ambulatory Visit: Payer: Self-pay

## 2021-06-25 ENCOUNTER — Encounter (HOSPITAL_BASED_OUTPATIENT_CLINIC_OR_DEPARTMENT_OTHER): Payer: Self-pay | Admitting: Emergency Medicine

## 2021-06-25 ENCOUNTER — Inpatient Hospital Stay (HOSPITAL_BASED_OUTPATIENT_CLINIC_OR_DEPARTMENT_OTHER)
Admission: EM | Admit: 2021-06-25 | Discharge: 2021-06-30 | DRG: 175 | Disposition: A | Discharge status: Home / Self Care | Payer: Medicaid Other | Source: Ambulatory Visit | Attending: Internal Medicine | Admitting: Internal Medicine

## 2021-06-25 ENCOUNTER — Emergency Department (INDEPENDENT_AMBULATORY_CARE_PROVIDER_SITE_OTHER)
Admission: EM | Admit: 2021-06-25 | Discharge: 2021-06-25 | Disposition: A | Discharge status: Home / Self Care | Payer: Medicaid Other | Source: Home / Self Care

## 2021-06-25 DIAGNOSIS — I2694 Multiple subsegmental pulmonary emboli without acute cor pulmonale: Principal | ICD-10-CM | POA: Diagnosis present

## 2021-06-25 DIAGNOSIS — Z6835 Body mass index (BMI) 35.0-35.9, adult: Secondary | ICD-10-CM | POA: Diagnosis not present

## 2021-06-25 DIAGNOSIS — N179 Acute kidney failure, unspecified: Secondary | ICD-10-CM | POA: Diagnosis present

## 2021-06-25 DIAGNOSIS — Z825 Family history of asthma and other chronic lower respiratory diseases: Secondary | ICD-10-CM

## 2021-06-25 DIAGNOSIS — Z87891 Personal history of nicotine dependence: Secondary | ICD-10-CM

## 2021-06-25 DIAGNOSIS — K5792 Diverticulitis of intestine, part unspecified, without perforation or abscess without bleeding: Secondary | ICD-10-CM | POA: Diagnosis not present

## 2021-06-25 DIAGNOSIS — F1010 Alcohol abuse, uncomplicated: Secondary | ICD-10-CM | POA: Diagnosis present

## 2021-06-25 DIAGNOSIS — I2699 Other pulmonary embolism without acute cor pulmonale: Secondary | ICD-10-CM | POA: Diagnosis present

## 2021-06-25 DIAGNOSIS — Z79899 Other long term (current) drug therapy: Secondary | ICD-10-CM

## 2021-06-25 DIAGNOSIS — Z8249 Family history of ischemic heart disease and other diseases of the circulatory system: Secondary | ICD-10-CM | POA: Diagnosis not present

## 2021-06-25 DIAGNOSIS — E669 Obesity, unspecified: Secondary | ICD-10-CM | POA: Diagnosis present

## 2021-06-25 DIAGNOSIS — R0602 Shortness of breath: Secondary | ICD-10-CM | POA: Diagnosis not present

## 2021-06-25 DIAGNOSIS — R0902 Hypoxemia: Secondary | ICD-10-CM

## 2021-06-25 DIAGNOSIS — K5732 Diverticulitis of large intestine without perforation or abscess without bleeding: Secondary | ICD-10-CM | POA: Diagnosis present

## 2021-06-25 DIAGNOSIS — I4819 Other persistent atrial fibrillation: Secondary | ICD-10-CM | POA: Diagnosis not present

## 2021-06-25 DIAGNOSIS — I5032 Chronic diastolic (congestive) heart failure: Secondary | ICD-10-CM | POA: Diagnosis present

## 2021-06-25 DIAGNOSIS — I82412 Acute embolism and thrombosis of left femoral vein: Secondary | ICD-10-CM | POA: Diagnosis present

## 2021-06-25 DIAGNOSIS — I130 Hypertensive heart and chronic kidney disease with heart failure and stage 1 through stage 4 chronic kidney disease, or unspecified chronic kidney disease: Secondary | ICD-10-CM | POA: Diagnosis present

## 2021-06-25 DIAGNOSIS — G4733 Obstructive sleep apnea (adult) (pediatric): Secondary | ICD-10-CM | POA: Diagnosis present

## 2021-06-25 DIAGNOSIS — J9601 Acute respiratory failure with hypoxia: Secondary | ICD-10-CM | POA: Diagnosis not present

## 2021-06-25 DIAGNOSIS — N182 Chronic kidney disease, stage 2 (mild): Secondary | ICD-10-CM | POA: Diagnosis not present

## 2021-06-25 LAB — BASIC METABOLIC PANEL
Anion gap: 6 (ref 5–15)
BUN: 21 mg/dL — ABNORMAL HIGH (ref 6–20)
CO2: 29 mmol/L (ref 22–32)
Calcium: 8.7 mg/dL — ABNORMAL LOW (ref 8.9–10.3)
Chloride: 100 mmol/L (ref 98–111)
Creatinine, Ser: 1.4 mg/dL — ABNORMAL HIGH (ref 0.61–1.24)
GFR, Estimated: 58 mL/min — ABNORMAL LOW (ref >60)
Glucose, Bld: 94 mg/dL (ref 70–99)
Potassium: 4.1 mmol/L (ref 3.5–5.1)
Sodium: 135 mmol/L (ref 135–145)

## 2021-06-25 LAB — CBC WITH DIFFERENTIAL/PLATELET
Abs Immature Granulocytes: 0.03 10*3/uL (ref 0.00–0.07)
Basophils Absolute: 0.1 10*3/uL (ref 0.0–0.1)
Basophils Relative: 1 %
Eosinophils Absolute: 0.3 10*3/uL (ref 0.0–0.5)
Eosinophils Relative: 4 %
HCT: 51.7 % (ref 39.0–52.0)
Hemoglobin: 17.4 g/dL — ABNORMAL HIGH (ref 13.0–17.0)
Immature Granulocytes: 0 %
Lymphocytes Relative: 17 %
Lymphs Abs: 1.2 10*3/uL (ref 0.7–4.0)
MCH: 31.5 pg (ref 26.0–34.0)
MCHC: 33.7 g/dL (ref 30.0–36.0)
MCV: 93.5 fL (ref 80.0–100.0)
Monocytes Absolute: 0.4 10*3/uL (ref 0.1–1.0)
Monocytes Relative: 5 %
Neutro Abs: 5.4 10*3/uL (ref 1.7–7.7)
Neutrophils Relative %: 73 %
Platelets: 198 10*3/uL (ref 150–400)
RBC: 5.53 MIL/uL (ref 4.22–5.81)
RDW: 14.5 % (ref 11.5–15.5)
WBC: 7.3 10*3/uL (ref 4.0–10.5)
nRBC: 0 % (ref 0.0–0.2)

## 2021-06-25 LAB — URINALYSIS, ROUTINE W REFLEX MICROSCOPIC
Bilirubin Urine: NEGATIVE
Glucose, UA: 100 mg/dL — AB
Hgb urine dipstick: NEGATIVE
Ketones, ur: NEGATIVE mg/dL
Leukocytes,Ua: NEGATIVE
Nitrite: NEGATIVE
Protein, ur: NEGATIVE mg/dL
Specific Gravity, Urine: 1.025 (ref 1.005–1.030)
pH: 6.5 (ref 5.0–8.0)

## 2021-06-25 LAB — HEPATIC FUNCTION PANEL
ALT: 23 U/L (ref 0–44)
AST: 31 U/L (ref 15–41)
Albumin: 4 g/dL (ref 3.5–5.0)
Alkaline Phosphatase: 46 U/L (ref 38–126)
Bilirubin, Direct: 0.2 mg/dL (ref 0.0–0.2)
Indirect Bilirubin: 1.1 mg/dL — ABNORMAL HIGH (ref 0.3–0.9)
Total Bilirubin: 1.3 mg/dL — ABNORMAL HIGH (ref 0.3–1.2)
Total Protein: 7.8 g/dL (ref 6.5–8.1)

## 2021-06-25 LAB — HEPARIN LEVEL (UNFRACTIONATED): Heparin Unfractionated: 0.27 IU/mL — ABNORMAL LOW (ref 0.30–0.70)

## 2021-06-25 LAB — LACTIC ACID, PLASMA: Lactic Acid, Venous: 1.3 mmol/L (ref 0.5–1.9)

## 2021-06-25 LAB — PROTIME-INR
INR: 1.1 (ref 0.8–1.2)
Prothrombin Time: 14 seconds (ref 11.4–15.2)

## 2021-06-25 LAB — BRAIN NATRIURETIC PEPTIDE: B Natriuretic Peptide: 44.8 pg/mL (ref 0.0–100.0)

## 2021-06-25 LAB — TROPONIN I (HIGH SENSITIVITY)
Troponin I (High Sensitivity): 3 ng/L (ref <18)
Troponin I (High Sensitivity): 3 ng/L (ref <18)

## 2021-06-25 LAB — APTT: aPTT: 29 seconds (ref 24–36)

## 2021-06-25 MED ORDER — SODIUM CHLORIDE 0.9 % IV SOLN: INTRAVENOUS | Status: AC

## 2021-06-25 MED ORDER — SODIUM CHLORIDE 0.9% FLUSH
3.0000 mL | Freq: Two times a day (BID) | INTRAVENOUS | Status: DC
  Administered 2021-06-26 – 2021-06-30 (×9): 3 mL via INTRAVENOUS

## 2021-06-25 MED ORDER — LORAZEPAM 1 MG PO TABS
1.0000 mg | ORAL_TABLET | ORAL | Status: AC | PRN
  Administered 2021-06-26: 1 mg via ORAL
  Filled 2021-06-25: qty 1

## 2021-06-25 MED ORDER — METRONIDAZOLE 500 MG/100ML IV SOLN
500.0000 mg | Freq: Two times a day (BID) | INTRAVENOUS | Status: DC
  Administered 2021-06-26 – 2021-06-28 (×5): 500 mg via INTRAVENOUS
  Filled 2021-06-25 (×5): qty 100

## 2021-06-25 MED ORDER — METRONIDAZOLE 500 MG/100ML IV SOLN
500.0000 mg | Freq: Once | INTRAVENOUS | Status: AC
  Administered 2021-06-25: 500 mg via INTRAVENOUS
  Filled 2021-06-25: qty 100

## 2021-06-25 MED ORDER — ONDANSETRON HCL 4 MG/2ML IJ SOLN
4.0000 mg | Freq: Once | INTRAMUSCULAR | Status: AC
Start: 2021-06-25 — End: 2021-06-25
  Administered 2021-06-25: 4 mg via INTRAVENOUS
  Filled 2021-06-25: qty 2

## 2021-06-25 MED ORDER — LORAZEPAM 1 MG PO TABS
0.0000 mg | ORAL_TABLET | Freq: Four times a day (QID) | ORAL | Status: AC
  Administered 2021-06-26: 1 mg via ORAL
  Filled 2021-06-25: qty 1

## 2021-06-25 MED ORDER — IOHEXOL 350 MG/ML SOLN
100.0000 mL | Freq: Once | INTRAVENOUS | Status: AC | PRN
  Administered 2021-06-25: 100 mL via INTRAVENOUS

## 2021-06-25 MED ORDER — HEPARIN BOLUS VIA INFUSION
6000.0000 [IU] | Freq: Once | INTRAVENOUS | Status: AC
Start: 2021-06-25 — End: 2021-06-25
  Administered 2021-06-25: 6000 [IU] via INTRAVENOUS

## 2021-06-25 MED ORDER — MELATONIN 3 MG PO TABS
3.0000 mg | ORAL_TABLET | Freq: Every evening | ORAL | Status: DC | PRN
  Administered 2021-06-26: 3 mg via ORAL
  Filled 2021-06-25: qty 1

## 2021-06-25 MED ORDER — FENTANYL CITRATE PF 50 MCG/ML IJ SOSY
25.0000 ug | PREFILLED_SYRINGE | Freq: Once | INTRAMUSCULAR | Status: AC
  Administered 2021-06-25: 25 ug via INTRAVENOUS
  Filled 2021-06-25: qty 1

## 2021-06-25 MED ORDER — ACETAMINOPHEN 325 MG PO TABS
650.0000 mg | ORAL_TABLET | Freq: Four times a day (QID) | ORAL | Status: DC | PRN
  Administered 2021-06-26: 650 mg via ORAL
  Filled 2021-06-25: qty 2

## 2021-06-25 MED ORDER — ACETAMINOPHEN 650 MG RE SUPP: 650.0000 mg | Freq: Four times a day (QID) | RECTAL | Status: DC | PRN

## 2021-06-25 MED ORDER — SENNOSIDES-DOCUSATE SODIUM 8.6-50 MG PO TABS: 1.0000 | ORAL_TABLET | Freq: Every evening | ORAL | Status: DC | PRN

## 2021-06-25 MED ORDER — ALBUTEROL SULFATE (2.5 MG/3ML) 0.083% IN NEBU: 3.0000 mL | INHALATION_SOLUTION | Freq: Four times a day (QID) | RESPIRATORY_TRACT | Status: DC | PRN

## 2021-06-25 MED ORDER — SODIUM CHLORIDE 0.9 % IV SOLN
2.0000 g | INTRAVENOUS | Status: DC
  Administered 2021-06-26 – 2021-06-27 (×2): 2 g via INTRAVENOUS
  Filled 2021-06-25 (×2): qty 20

## 2021-06-25 MED ORDER — FOLIC ACID 1 MG PO TABS
1.0000 mg | ORAL_TABLET | Freq: Every day | ORAL | Status: DC
  Administered 2021-06-26 – 2021-06-30 (×5): 1 mg via ORAL
  Filled 2021-06-25 (×5): qty 1

## 2021-06-25 MED ORDER — HYDROMORPHONE HCL 1 MG/ML IJ SOLN
0.5000 mg | INTRAMUSCULAR | Status: AC | PRN
  Administered 2021-06-25 – 2021-06-26 (×3): 0.5 mg via INTRAVENOUS
  Filled 2021-06-25 (×3): qty 0.5

## 2021-06-25 MED ORDER — SODIUM CHLORIDE 0.9 % IV BOLUS
500.0000 mL | Freq: Once | INTRAVENOUS | Status: AC
  Administered 2021-06-25: 500 mL via INTRAVENOUS

## 2021-06-25 MED ORDER — LORAZEPAM 2 MG/ML IJ SOLN: 1.0000 mg | INTRAMUSCULAR | Status: AC | PRN

## 2021-06-25 MED ORDER — THIAMINE HCL 100 MG PO TABS
100.0000 mg | ORAL_TABLET | Freq: Every day | ORAL | Status: DC
  Administered 2021-06-26 – 2021-06-30 (×5): 100 mg via ORAL
  Filled 2021-06-25 (×5): qty 1

## 2021-06-25 MED ORDER — DOFETILIDE 250 MCG PO CAPS
500.0000 ug | ORAL_CAPSULE | Freq: Two times a day (BID) | ORAL | Status: DC
  Administered 2021-06-26 – 2021-06-30 (×10): 500 ug via ORAL
  Filled 2021-06-25 (×11): qty 2

## 2021-06-25 MED ORDER — METOPROLOL SUCCINATE ER 100 MG PO TB24
100.0000 mg | ORAL_TABLET | Freq: Every day | ORAL | Status: DC
  Administered 2021-06-26 – 2021-06-30 (×3): 100 mg via ORAL
  Filled 2021-06-25 (×5): qty 1

## 2021-06-25 MED ORDER — ONDANSETRON HCL 4 MG/2ML IJ SOLN
4.0000 mg | Freq: Four times a day (QID) | INTRAMUSCULAR | Status: DC | PRN
Start: 2021-06-25 — End: 2021-06-25

## 2021-06-25 MED ORDER — HYDROMORPHONE HCL 1 MG/ML IJ SOLN
0.5000 mg | Freq: Once | INTRAMUSCULAR | Status: AC
  Administered 2021-06-25: 0.5 mg via INTRAVENOUS
  Filled 2021-06-25: qty 1

## 2021-06-25 MED ORDER — ADULT MULTIVITAMIN W/MINERALS CH
1.0000 | ORAL_TABLET | Freq: Every day | ORAL | Status: DC
  Administered 2021-06-26 – 2021-06-30 (×5): 1 via ORAL
  Filled 2021-06-25 (×5): qty 1

## 2021-06-25 MED ORDER — HEPARIN (PORCINE) 25000 UT/250ML-% IV SOLN
2150.0000 [IU]/h | INTRAVENOUS | Status: AC
  Administered 2021-06-25: 1750 [IU]/h via INTRAVENOUS
  Administered 2021-06-26: 2150 [IU]/h via INTRAVENOUS
  Administered 2021-06-26: 1950 [IU]/h via INTRAVENOUS
  Administered 2021-06-27: 2150 [IU]/h via INTRAVENOUS
  Filled 2021-06-25 (×4): qty 250

## 2021-06-25 MED ORDER — SODIUM CHLORIDE 0.9 % IV SOLN
2.0000 g | Freq: Once | INTRAVENOUS | Status: AC
  Administered 2021-06-25: 2 g via INTRAVENOUS
  Filled 2021-06-25: qty 20

## 2021-06-25 MED ORDER — THIAMINE HCL 100 MG/ML IJ SOLN: 100.0000 mg | Freq: Every day | INTRAMUSCULAR | Status: DC

## 2021-06-25 MED ORDER — LORAZEPAM 1 MG PO TABS
0.0000 mg | ORAL_TABLET | Freq: Two times a day (BID) | ORAL | Status: AC
  Administered 2021-06-28 (×3): 1 mg via ORAL
  Filled 2021-06-25 (×3): qty 1

## 2021-06-25 NOTE — Discharge Instructions (Addendum)
Instructed patient/wife to go to [facility] NOW for immediate evaluation of shortness of breath. Advised this would include imaging, EKG, oxygen supplementation, serological tests and other.

## 2021-06-25 NOTE — ED Notes (Signed)
Patient transported to CT 

## 2021-06-25 NOTE — ED Triage Notes (Signed)
Pt here today for shortness of breath as well as urinary issues. PCP did call in abx (bactrim) for prostate issues. Would like UA just to be sure its not UTI due to dark colored urine. Hx of COPD and Afib.

## 2021-06-25 NOTE — ED Provider Notes (Signed)
[facility]ER[facility]ENC[facility] DEPARTMENT Provider Note   CSN: [id number] Arrival date & time: 06/25/21  1417     History  Chief Complaint  Patient presents with   Shortness of Breath    [name] [name] is a 61 y.o. male.  61 year old male with history of COPD, hypertension, A-fib (not currently anticoagulated), OSA, chronic diastolic CHF presents with complaint of abdominal distention and discomfort.  Patient states that on Friday he developed suprapubic discomfort yesterday, thought he had a bladder infection and PCP office called in Bactrim which he has been taking. No with dark urine. Also states for the past 6-12 months, some nights when he is about to fall asleep he experiences a lightening bolt type pain from his suprapubic area to his heart that wakes him suddenly from sleep breathless.  States that this is becoming more painful over time.  Also reports abdominal distention, states similar several months ago, was on a fluid pill that seems to help with his abdominal distention and discomfort however this pill was discontinued and now reports worsening fluid in his abdomen.  States that he has COPD and is short of breath at baseline, does not feel that this is particularly worse today.  Also notes mild lower extremity edema which is not worse than usual.  Denies fevers, chills, nausea, vomiting, chest pain other than the discomfort he experiences at night at times.  Patient went to urgent care today for his symptoms, was found to be hypoxic and was sent to the emergency room for further work-up.      Home Medications Prior to Admission medications   Medication Sig Start Date End Date Taking? Authorizing Provider  dofetilide (TIKOSYN) 500 MCG capsule TAKE 1 CAPSULE BY MOUTH 2 TIMES DAILY. 10/27/20   Camnitz, Ocie Doyne, MD  furosemide (LASIX) 20 MG tablet Take 20 mg by mouth daily.    [provider]  Glycopyrrolate-Formoterol 9-4.8 MCG/ACT AERO Inhale 1 puff into the  lungs daily as needed (respiratory issues.).     [provider]  magnesium oxide (MAG-OX) 400 (241.3 Mg) MG tablet Take 1 tablet (400 mg total) by mouth daily. 03/23/17   Baldwin Jamaica, PA-C  metoprolol succinate (TOPROL-XL) 100 MG 24 hr tablet TAKE 1 TABLET BY MOUTH DAILY. 10/27/20   Camnitz, Will Hassell Done, MD  ondansetron (ZOFRAN ODT) 4 MG disintegrating tablet Take 1 tablet (4 mg total) by mouth every 8 (eight) hours as needed for nausea or vomiting. 06/25/18   [name], Barbette Hair, MD  oxyCODONE-acetaminophen (PERCOCET/ROXICET) 5-325 MG tablet Take 1 tablet by mouth every 6 (six) hours as needed for severe pain. 05/03/19   Lorin Glass, PA-C  sulfamethoxazole-trimethoprim (BACTRIM DS) 800-160 MG tablet Take 1 tablet by mouth 2 (two) times daily. 06/24/21   [provider]  torsemide (DEMADEX) 20 MG tablet Take 1 tablet (20 mg total) by mouth 2 (two) times daily. Patient not taking: Reported on 09/06/2020 05/09/19   Constance Haw, MD      Allergies    Patient has no known allergies.    Review of Systems   Review of Systems Negative except as per HPI Physical Exam Updated Vital Signs BP 119/70   Pulse 81   Temp (!) 97.5 F (36.4 C) (Oral)   Resp 16   Ht 5\' 10"  (1.778 m)   Wt 113.4 kg   SpO2 93%   BMI 35.87 kg/m  Physical Exam Vitals and nursing note reviewed.  Constitutional:  Appearance: He is well-developed. He is obese.  HENT:     Head: Normocephalic and atraumatic.     Mouth/Throat:     Mouth: Mucous membranes are moist.  Eyes:     Extraocular Movements: Extraocular movements intact.     Pupils: Pupils are equal, round, and reactive to light.  Cardiovascular:     Rate and Rhythm: Normal rate and regular rhythm.     Pulses: Normal pulses.     Heart sounds: Normal heart sounds.  Pulmonary:     Effort: Pulmonary effort is normal.     Breath sounds: Examination of the right-lower field reveals decreased breath sounds. Examination of the  left-lower field reveals decreased breath sounds. Decreased breath sounds present. No wheezing, rhonchi or rales.  Abdominal:     General: There is distension.     Palpations: Abdomen is soft.     Tenderness: There is abdominal tenderness.  Musculoskeletal:     Cervical back: Neck supple.     Right lower leg: Edema present.     Left lower leg: Edema present.     Comments: Trace bilateral lower extremity pitting edema  Skin:    General: Skin is warm and dry.     Findings: No erythema or rash.  Neurological:     Mental Status: He is alert and oriented to person, place, and time.  Psychiatric:        Behavior: Behavior normal.    ED Results / Procedures / Treatments   Labs (all labs ordered are listed, but only abnormal results are displayed) Labs Reviewed  BASIC METABOLIC PANEL - Abnormal; Notable for the following components:      Result Value   BUN 21 (*)    Creatinine, Ser 1.40 (*)    Calcium 8.7 (*)    GFR, Estimated 58 (*)    All other components within normal limits  CBC WITH DIFFERENTIAL/PLATELET - Abnormal; Notable for the following components:   Hemoglobin 17.4 (*)    All other components within normal limits  URINALYSIS, ROUTINE W REFLEX MICROSCOPIC - Abnormal; Notable for the following components:   Glucose, UA 100 (*)    All other components within normal limits  HEPATIC FUNCTION PANEL - Abnormal; Notable for the following components:   Total Bilirubin 1.3 (*)    Indirect Bilirubin 1.1 (*)    All other components within normal limits  BRAIN NATRIURETIC PEPTIDE  PROTIME-INR  LACTIC ACID, PLASMA  APTT  HEPARIN LEVEL (UNFRACTIONATED)  HEPARIN LEVEL (UNFRACTIONATED)  TROPONIN I (HIGH SENSITIVITY)  TROPONIN I (HIGH SENSITIVITY)    EKG EKG Interpretation  Date/Time:  Saturday Jun 25 2021 14:25:48 EDT Ventricular Rate:  78 PR Interval:  178 QRS Duration: 99 QT Interval:  429 QTC Calculation: 489 R Axis:   -88 Text Interpretation: Sinus rhythm Left  anterior fascicular block Confirmed by Ronnald Nian, Adam ([id number]) on 06/25/2021 3:22:54 PM  Radiology CT Angio Chest PE W/Cm &/Or Wo Cm  Result Date: 06/25/2021 CLINICAL DATA:  Shortness of breath and hypoxia. Atrial fibrillation. Lower abdominal pain. Dark urine. High probability for pulmonary embolism. EXAM: CT ANGIOGRAPHY CHEST CT ABDOMEN AND PELVIS WITH CONTRAST TECHNIQUE: Multidetector CT imaging of the chest was performed using the standard protocol during bolus administration of intravenous contrast. Multiplanar CT image reconstructions and MIPs were obtained to evaluate the vascular anatomy. Multidetector CT imaging of the abdomen and pelvis was performed using the standard protocol during bolus administration of intravenous contrast. RADIATION DOSE REDUCTION: This exam was performed according to  the departmental dose-optimization program which includes automated exposure control, adjustment of the mA and/or kV according to patient size and/or use of iterative reconstruction technique. CONTRAST:  136mL OMNIPAQUE IOHEXOL 350 MG/ML SOLN COMPARISON:  05/03/2019 FINDINGS: CTA CHEST FINDINGS Cardiovascular: Nonocclusive pulmonary embolism is seen in the segmental branches of the bilateral upper, right middle, and right lower lobar pulmonary arteries. No evidence of thoracic aortic aneurysm or dissection. Mediastinum/Nodes: No masses or pathologically enlarged lymph nodes identified. Lungs/Pleura: No pulmonary mass, infiltrate, or effusion. Mild atelectasis or scarring in both lower lungs. Mild centrilobular emphysema also noted. Musculoskeletal: No suspicious bone lesions identified. Review of the MIP images confirms the above findings. CT ABDOMEN and PELVIS FINDINGS Hepatobiliary: No hepatic masses identified. Gallbladder is unremarkable. No evidence of biliary ductal dilatation. Pancreas:  No mass or inflammatory changes. Spleen: Within normal limits in size and appearance. Adrenals/Urinary Tract: No masses  identified. Tiny punctate calculus noted in lower pole of right kidney. No evidence of ureteral calculi or hydronephrosis. Stomach/Bowel: Mild sigmoid diverticulitis is demonstrated. No evidence of abscess or bowel perforation. Vascular/Lymphatic: No pathologically enlarged lymph nodes. No acute vascular findings. Aortic atherosclerotic calcification incidentally noted. Reproductive:  No mass or other significant abnormality. Other:  None. Musculoskeletal:  No suspicious bone lesions identified. Review of the MIP images confirms the above findings. IMPRESSION: Nonocclusive pulmonary embolism in bilateral segmental pulmonary artery branches. Mild sigmoid diverticulitis. No evidence of abscess or bowel perforation. Tiny nonobstructing right renal calculus. Aortic Atherosclerosis (ICD10-I70.0) and Emphysema (ICD10-J43.9). Critical Value/emergent results were called by telephone at the time of interpretation on 06/25/2021 at 4:55 pm to provider [facility] , who verbally acknowledged these results. Electronically Signed   By: Marlaine Hind M.D.   On: 06/25/2021 16:56   CT Abdomen Pelvis W Contrast  Result Date: 06/25/2021 CLINICAL DATA:  Shortness of breath and hypoxia. Atrial fibrillation. Lower abdominal pain. Dark urine. High probability for pulmonary embolism. EXAM: CT ANGIOGRAPHY CHEST CT ABDOMEN AND PELVIS WITH CONTRAST TECHNIQUE: Multidetector CT imaging of the chest was performed using the standard protocol during bolus administration of intravenous contrast. Multiplanar CT image reconstructions and MIPs were obtained to evaluate the vascular anatomy. Multidetector CT imaging of the abdomen and pelvis was performed using the standard protocol during bolus administration of intravenous contrast. RADIATION DOSE REDUCTION: This exam was performed according to the departmental dose-optimization program which includes automated exposure control, adjustment of the mA and/or kV according to patient size and/or use  of iterative reconstruction technique. CONTRAST:  146mL OMNIPAQUE IOHEXOL 350 MG/ML SOLN COMPARISON:  05/03/2019 FINDINGS: CTA CHEST FINDINGS Cardiovascular: Nonocclusive pulmonary embolism is seen in the segmental branches of the bilateral upper, right middle, and right lower lobar pulmonary arteries. No evidence of thoracic aortic aneurysm or dissection. Mediastinum/Nodes: No masses or pathologically enlarged lymph nodes identified. Lungs/Pleura: No pulmonary mass, infiltrate, or effusion. Mild atelectasis or scarring in both lower lungs. Mild centrilobular emphysema also noted. Musculoskeletal: No suspicious bone lesions identified. Review of the MIP images confirms the above findings. CT ABDOMEN and PELVIS FINDINGS Hepatobiliary: No hepatic masses identified. Gallbladder is unremarkable. No evidence of biliary ductal dilatation. Pancreas:  No mass or inflammatory changes. Spleen: Within normal limits in size and appearance. Adrenals/Urinary Tract: No masses identified. Tiny punctate calculus noted in lower pole of right kidney. No evidence of ureteral calculi or hydronephrosis. Stomach/Bowel: Mild sigmoid diverticulitis is demonstrated. No evidence of abscess or bowel perforation. Vascular/Lymphatic: No pathologically enlarged lymph nodes. No acute vascular findings. Aortic atherosclerotic calcification incidentally noted. Reproductive:  No mass or other significant abnormality. Other:  None. Musculoskeletal:  No suspicious bone lesions identified. Review of the MIP images confirms the above findings. IMPRESSION: Nonocclusive pulmonary embolism in bilateral segmental pulmonary artery branches. Mild sigmoid diverticulitis. No evidence of abscess or bowel perforation. Tiny nonobstructing right renal calculus. Aortic Atherosclerosis (ICD10-I70.0) and Emphysema (ICD10-J43.9). Critical Value/emergent results were called by telephone at the time of interpretation on 06/25/2021 at 4:55 pm to provider [facility] , who  verbally acknowledged these results. Electronically Signed   By: Marlaine Hind M.D.   On: 06/25/2021 16:56   DG Chest Port 1 View  Result Date: 06/25/2021 CLINICAL DATA:  Shortness of breath. History of atrial fibrillation. EXAM: PORTABLE CHEST 1 VIEW COMPARISON:  12/12/2019 FINDINGS: The cardiomediastinal silhouette is unremarkable. Mild bibasilar atelectasis/scarring is noted, increased on the RIGHT. No airspace disease, pleural effusion or pneumothorax noted. No acute bony abnormalities are present. IMPRESSION: Mild bibasilar atelectasis/scarring, increased on the RIGHT. Electronically Signed   By: Margarette Canada M.D.   On: 06/25/2021 15:30    Procedures .Critical Care Performed by: Tacy Learn, PA-C Authorized by: Tacy Learn, PA-C   Critical care provider statement:    Critical care time (minutes):  30   Critical care was time spent personally by me on the following activities:  Development of treatment plan with patient or surrogate, discussions with consultants, evaluation of patient's response to treatment, examination of patient, ordering and review of laboratory studies, ordering and review of radiographic studies, ordering and performing treatments and interventions, pulse oximetry, re-evaluation of patient's condition and review of old charts    Medications Ordered in ED Medications  cefTRIAXone (ROCEPHIN) 2 g in sodium chloride 0.9 % 100 mL IVPB (2 g Intravenous New Bag/Given 06/25/21 1739)    And  metroNIDAZOLE (FLAGYL) IVPB 500 mg (500 mg Intravenous New Bag/Given 06/25/21 1742)  heparin ADULT infusion 100 units/mL (25000 units/231mL) (1,750 Units/hr Intravenous New Bag/Given 06/25/21 1755)  iohexol (OMNIPAQUE) 350 MG/ML injection 100 mL (100 mLs Intravenous Contrast Given 06/25/21 1618)  sodium chloride 0.9 % bolus 500 mL (0 mLs Intravenous Stopped 06/25/21 1756)  ondansetron (ZOFRAN) injection 4 mg (4 mg Intravenous Given 06/25/21 1658)  HYDROmorphone (DILAUDID) injection 0.5  mg (0.5 mg Intravenous Given 06/25/21 1658)  heparin bolus via infusion 6,000 Units (6,000 Units Intravenous Bolus from Bag 06/25/21 1755)    ED Course/ Medical Decision Making/ A&P                           Medical Decision Making Amount and/or Complexity of Data Reviewed Labs: ordered. Radiology: ordered.   This patient presents to the ED for concern of complaint of abdominal pain, this involves an extensive number of treatment options, and is a complaint that carries with it a high risk of complications and morbidity.  The differential diagnosis includes but not limited to urinary tract infection, prostatitis, diverticulitis, cirrhosis, ascites, CHF, AMI, arrhythmia, PE, OSA, COPD   Co morbidities that complicate the patient evaluation  Paroxysmal A-fib, not currently anticoagulated, CHF, COPD (quit smoking 15 years ago), regular alcohol intake   Additional history obtained:  Additional history obtained from sister at bedside who reports patient drinks more than he admits to External records from outside source obtained and reviewed including visit to urgent care today where patient was found to have O2 sat of 88% on room air and sent to the ER for further work-up  Prior echo from April 2021  showing preserved EF at 60 to 65%   Lab Tests:  I Ordered, and personally interpreted labs.  The pertinent results include: Urinalysis with glucose otherwise unremarkable, troponin is 3, lactic acid reassuring at 1.3, hepatic function with elevated bilirubin, normal AST and ALT, BNP normal at 44.8.  CBC with normal white count.  BMP with elevated creatinine at 1.4 compared to 1.0 prior on file.   Imaging Studies ordered:  I ordered imaging studies including chest x-ray I independently visualized and interpreted imaging which showed atelectasis I agree with the radiologist interpretation CT chest PE study showing segmental bilateral PEs without heart strain.  CT abdomen pelvis with  diverticulitis without abscess or perforation   Cardiac Monitoring: / EKG:  The patient was maintained on a cardiac monitor.  I personally viewed and interpreted the cardiac monitored which showed an underlying rhythm of: sinus rhythm    Consultations Obtained:  Case discussed with Dr. Billy Fischer, ER attending, agrees with plan for CTA chest to evaluate for PE causing patient's hypoxia as well as a CT abdomen pelvis with contrast to further evaluate his complaint of lower abdominal pain I requested consultation with the hospitalist Dr. Tyrell Antonio,  and discussed lab and imaging findings as well as pertinent plan - they recommend: admission   Problem List / ED Course / Critical interventions / Medication management  61 year old male presents with complaint of abdominal distention thought maybe he had a UTI and called his PCP, started Bactrim yesterday.  Worsening abdominal discomfort today prompting visit to urgent care where patient was found to be hypoxic.  Denied complains of shortness of breath states that he has COPD and he is always short of breath.  He is found to be hypoxic with room air O2 sat of 85 to 88%, improved to low 90s on 3 L nasal cannula.  Broad differential for this complex patient today, he was found to have bilateral segmental PEs and started on heparin, also found to have diverticulitis, started on Flagyl and Rocephin.  Plan is for admission to the hospital for further management. I ordered medication including heparin for PE, Rocephin and Flagyl for diverticulitis, Dilaudid for pain, oxygen for hypoxia Reevaluation of the patient after these medicines showed that the patient improved I have reviewed the patients home medicines and have made adjustments as needed   Social Determinants of Health:  Has difficulty with transportation to healthcare visits   Test / Admission - Considered:  Plan is for admission for further monitoring and treatment.         Final  Clinical Impression(s) / ED Diagnoses Final diagnoses:  Diverticulitis  Multiple subsegmental pulmonary emboli without acute cor pulmonale ([place])  Hypoxia    Rx / DC Orders ED Discharge Orders     None         Tacy Learn, PA-C 06/25/21 1831    Lennice Sites, DO 06/26/21 [id number]

## 2021-06-25 NOTE — ED Provider Notes (Signed)
[name]    CSN: [id number] Arrival date & time: 06/25/21  1327      History   Chief Complaint Chief Complaint  Patient presents with   Urinary Issues   Shortness of Breath    HPI DEMONE GAVIDIA is a 62 y.o. male.   HPI 61 year old male presents with shortness of breath and urinary issues 4 days.  Patient reports shortness of breath with intermittent chest pain last night at home while trying to sleep.  PMH significant for chronic diastolic heart failure, COPD, obesity, and persistent A-fib.  Past Medical History:  Diagnosis Date   Chronic diastolic CHF (congestive heart failure) (HCC)    COPD (chronic obstructive pulmonary disease) (HCC)    Hypertension    Obesity (BMI 30-39.9) 12/15/2016   OSA (obstructive sleep apnea)    Persistent atrial fibrillation ([place]) 12/15/2016   Visit for monitoring Tikosyn therapy     Patient Active Problem List   Diagnosis Date Noted   Angina pectoris ([place]) 02/22/2018   Shortness of breath 02/22/2018   (HFpEF) heart failure with preserved ejection fraction ([place]) 02/22/2018   Hypertensive heart disease without CHF 03/22/2017   Persistent atrial fibrillation ([place]) 12/15/2016   Obesity (BMI 30-39.9) 12/15/2016   Long term current use of anticoagulant therapy 12/15/2016    Past Surgical History:  Procedure Laterality Date   CARDIAC CATHETERIZATION  02/22/2018   CARDIOVERSION N/A 12/22/2016   Procedure: CARDIOVERSION;  Surgeon: Jacolyn Reedy, MD;  Location: [facility];  Service: Cardiovascular;  Laterality: N/A;   HEMORROIDECTOMY     HERNIA REPAIR     RIGHT/LEFT HEART CATH AND CORONARY ANGIOGRAPHY N/A 02/22/2018   Procedure: RIGHT/LEFT HEART CATH AND CORONARY ANGIOGRAPHY;  Surgeon: Nelva Bush, MD;  Location: [facility];  Service: Cardiovascular;  Laterality: N/A;       Home Medications    Prior to Admission medications   Medication Sig Start Date End Date Taking? Authorizing Provider  dofetilide  (TIKOSYN) 500 MCG capsule TAKE 1 CAPSULE BY MOUTH 2 TIMES DAILY. 10/27/20   Camnitz, Ocie Doyne, MD  furosemide (LASIX) 20 MG tablet Take 20 mg by mouth daily.    [provider]  Glycopyrrolate-Formoterol 9-4.8 MCG/ACT AERO Inhale 1 puff into the lungs daily as needed (respiratory issues.).     [provider]  magnesium oxide (MAG-OX) 400 (241.3 Mg) MG tablet Take 1 tablet (400 mg total) by mouth daily. 03/23/17   Baldwin Jamaica, PA-C  metoprolol succinate (TOPROL-XL) 100 MG 24 hr tablet TAKE 1 TABLET BY MOUTH DAILY. 10/27/20   Camnitz, Will Hassell Done, MD  ondansetron (ZOFRAN ODT) 4 MG disintegrating tablet Take 1 tablet (4 mg total) by mouth every 8 (eight) hours as needed for nausea or vomiting. 06/25/18   [name], Barbette Hair, MD  oxyCODONE-acetaminophen (PERCOCET/ROXICET) 5-325 MG tablet Take 1 tablet by mouth every 6 (six) hours as needed for severe pain. 05/03/19   Lorin Glass, PA-C  sulfamethoxazole-trimethoprim (BACTRIM DS) 800-160 MG tablet Take 1 tablet by mouth 2 (two) times daily. 06/24/21   [provider]  torsemide (DEMADEX) 20 MG tablet Take 1 tablet (20 mg total) by mouth 2 (two) times daily. Patient not taking: Reported on 09/06/2020 05/09/19   Constance Haw, MD    Family History Family History  Problem Relation Age of Onset   COPD Mother    COPD Father    Atrial fibrillation Brother    CAD Brother     Social History Social History  Tobacco Use   Smoking status: Former    Packs/day: 1.00    Years: 30.00    Pack years: 30.00    Types: Cigarettes   Smokeless tobacco: Never  Vaping Use   Vaping Use: Never used  Substance Use Topics   Alcohol use: Yes    Alcohol/week: 4.0 standard drinks    Types: 4 Cans of beer per week   Drug use: No     Allergies   Patient has no known allergies.   Review of Systems Review of Systems  Constitutional:  Positive for fatigue.  Respiratory:  Positive for shortness of breath.   All other  systems reviewed and are negative.   Physical Exam Triage Vital Signs ED Triage Vitals  Enc Vitals Group     BP 06/25/21 1347 138/74     Pulse Rate 06/25/21 1347 81     Resp 06/25/21 1347 19     Temp 06/25/21 1347 (!) 97.5 F (36.4 C)     Temp Source 06/25/21 1347 Oral     SpO2 06/25/21 1347 (!) 88 %     Weight --      Height --      Head Circumference --      Peak Flow --      Pain Score 06/25/21 1348 0     Pain Loc --      Pain Edu? --      Excl. in [place]? --    No data found.  Updated Vital Signs BP 138/74 (BP Location: Right Arm)   Pulse 81   Temp (!) 97.5 F (36.4 C) (Oral)   Resp 19   SpO2 (!) 88%       Physical Exam Vitals reviewed.  Constitutional:      General: He is not in acute distress.    Appearance: He is obese. He is ill-appearing.  HENT:     Head: Normocephalic and atraumatic.     Mouth/Throat:     Mouth: Mucous membranes are moist.     Pharynx: Oropharynx is clear.  Eyes:     Extraocular Movements: Extraocular movements intact.     Pupils: Pupils are equal, round, and reactive to light.  Neck:     Vascular: No JVD.     Comments: No JVD, no bruit Cardiovascular:     Rate and Rhythm: Normal rate and regular rhythm.     Pulses: Normal pulses. No decreased pulses.     Heart sounds: Normal heart sounds.     Comments: Irregularly irregular Pulmonary:     Effort: Pulmonary effort is normal.     Breath sounds: Examination of the right-upper field reveals decreased breath sounds. Examination of the left-upper field reveals decreased breath sounds. Examination of the right-middle field reveals decreased breath sounds. Examination of the right-lower field reveals decreased breath sounds. Examination of the left-lower field reveals decreased breath sounds. Decreased breath sounds present.     Comments: SPO2 range 84 to 88% Musculoskeletal:     Cervical back: Neck supple.     Right lower leg: Edema present.     Left lower leg: Edema present.      Comments: +2 pitting/nonpitting edema bilaterally  Neurological:     General: No focal deficit present.     Mental Status: He is alert and oriented to person, place, and time.     UC Treatments / Results  Labs (all labs ordered are listed, but only abnormal results are displayed) Labs Reviewed - No data  to display  EKG   Radiology No results found.  Procedures Procedures (including critical care time)  Medications Ordered in UC Medications - No data to display  Initial Impression / Assessment and Plan / UC Course  I have reviewed the triage vital signs and the nursing notes.  Pertinent labs & imaging results that were available during my care of the patient were reviewed by me and considered in my medical decision making (see chart for details).     MDM: 1.  Shortness of breath-Instructed patient/wife to go to [place] for immediate evaluation of shortness of breath. Advised this would include imaging, EKG, oxygen supplementation, serological tests and other.  Patient/wife agreed and verbalized understanding of these instructions and this plan of care today. Final Clinical Impressions(s) / UC Diagnoses   Final diagnoses:  SOB (shortness of breath)     Discharge Instructions      Instructed patient/wife to go to [facility] for immediate evaluation of shortness of breath. Advised this would include imaging, EKG, oxygen supplementation, serological tests and other.     ED Prescriptions   None    PDMP not reviewed this encounter.   Eliezer Lofts, [place] 06/25/21 1409

## 2021-06-25 NOTE — H&P (Addendum)
History and Physical    [name] [name] [id number] DOB: 1960/07/03 DOA: 06/25/2021  PCP: Leonard Downing, MD   Patient coming from: Home   Chief Complaint: Lower abdominal pain, SOB    HPI: [name] [name] is a pleasant 61 y.o. male with medical history significant for atrial fibrillation not anticoagulated due to CHA2DS2-VASc of 0, OSA, BMI 36, COPD, and excessive alcohol use, now presenting to the emergency department for evaluation of lower abdominal pain and shortness of breath.  Patient reports severe lower abdominal pain for the past 2 days that he was suspecting may be a bladder infection, and for which she was started on Bactrim.  He also notes 2 years of shortness of breath with significant worsening in the past 2 to 3 months.  He denies any cough or fever.  He initially denies chest pain but later notes occasional pain in his chest but only when he is trying to sleep.  He has bilateral leg swelling that has been chronic, was much worse 3 to 4 months ago, but improved with a course of diuretics.  He denies any recent surgery or prolonged immobilization.  [facility] ED Course: Upon arrival to the ED, patient is found to be afebrile and saturating in the mid 80s on room air with stable blood pressure and normal heart rate.  EKG features sinus rhythm with LAFB.  CTA chest reveals nonocclusive PE in bilateral segmental pulmonary arteries.  CT of the abdomen and pelvis raises concern for mild diverticulitis without abscess or perforation.  Blood work notable for creatinine of 1.48.  Patient was given Rocephin, Flagyl, Dilaudid, IV fluids, started on IV heparin and supplemental oxygen, and transferred to [facility] for admission.  Review of Systems:  All other systems reviewed and apart from HPI, are negative.  Past Medical History:  Diagnosis Date   Chronic diastolic CHF (congestive heart failure) (HCC)    COPD (chronic obstructive pulmonary disease) (HCC)    Hypertension     Obesity (BMI 30-39.9) 12/15/2016   OSA (obstructive sleep apnea)    Persistent atrial fibrillation ([place]) 12/15/2016   Visit for monitoring Tikosyn therapy     Past Surgical History:  Procedure Laterality Date   CARDIAC CATHETERIZATION  02/22/2018   CARDIOVERSION N/A 12/22/2016   Procedure: CARDIOVERSION;  Surgeon: Jacolyn Reedy, MD;  Location: [facility];  Service: Cardiovascular;  Laterality: N/A;   HEMORROIDECTOMY     HERNIA REPAIR     RIGHT/LEFT HEART CATH AND CORONARY ANGIOGRAPHY N/A 02/22/2018   Procedure: RIGHT/LEFT HEART CATH AND CORONARY ANGIOGRAPHY;  Surgeon: Nelva Bush, MD;  Location: [place] [facility];  Service: Cardiovascular;  Laterality: N/A;    Social History:   reports that he has quit smoking. He has a 30.00 pack-year smoking history. He has never used smokeless tobacco. He reports current alcohol use of about 4.0 standard drinks per week. He reports that he does not use drugs.  No Known Allergies  Family History  Problem Relation Age of Onset   COPD Mother    COPD Father    Atrial fibrillation Brother    CAD Brother      Prior to Admission medications   Medication Sig Start Date End Date Taking? Authorizing Provider  albuterol (VENTOLIN HFA) 108 (90 Base) MCG/ACT inhaler Inhale 2 puffs into the lungs every 6 (six) hours as needed for wheezing or shortness of breath.   Yes [provider]  dofetilide (TIKOSYN) 500 MCG capsule TAKE 1 CAPSULE BY MOUTH  2 TIMES DAILY. 10/27/20  Yes Camnitz, Will Hassell Done, MD  metoprolol succinate (TOPROL-XL) 100 MG 24 hr tablet TAKE 1 TABLET BY MOUTH DAILY. 10/27/20  Yes Camnitz, Ocie Doyne, MD  magnesium oxide (MAG-OX) 400 (241.3 Mg) MG tablet Take 1 tablet (400 mg total) by mouth daily. Patient not taking: Reported on 06/25/2021 03/23/17   Baldwin Jamaica, PA-C  ondansetron (ZOFRAN ODT) 4 MG disintegrating tablet Take 1 tablet (4 mg total) by mouth every 8 (eight) hours as needed for nausea or vomiting. Patient  not taking: Reported on 06/25/2021 06/25/18   [name], Barbette Hair, MD  oxyCODONE-acetaminophen (PERCOCET/ROXICET) 5-325 MG tablet Take 1 tablet by mouth every 6 (six) hours as needed for severe pain. Patient not taking: Reported on 06/25/2021 05/03/19   Lorin Glass, PA-C  torsemide (DEMADEX) 20 MG tablet Take 1 tablet (20 mg total) by mouth 2 (two) times daily. Patient not taking: Reported on 09/06/2020 05/09/19   Constance Haw, MD    Physical Exam: Vitals:   06/25/21 2000 06/25/21 2015 06/25/21 2030 06/25/21 2152  BP: 114/65   131/79  Pulse: 68 78 69 78  Resp: 13 19 12 18   Temp:    97.9 F (36.6 C)  TempSrc:    Oral  SpO2: 93% 97% 97% 92%  Weight:      Height:         Constitutional: NAD, calm  Eyes: PERTLA, lids and conjunctivae normal ENMT: Mucous membranes are moist. Posterior pharynx clear of any exudate or lesions.   Neck: supple, no masses  Respiratory: no wheezing, no crackles. No accessory muscle use.  Cardiovascular: S1 & S2 heard, regular rate and rhythm. Pretibial pitting edema b/l.   Abdomen: Soft, tender in lower abdomen without rebound pain or guarding. Bowel sounds active.  Musculoskeletal: no clubbing / cyanosis. No joint deformity upper and lower extremities.   Skin: no significant rashes, lesions, ulcers. Warm, dry, well-perfused. Neurologic: CN 2-12 grossly intact. Moving all extremities. Alert and oriented.  Psychiatric: Pleasant. Cooperative.    Labs and Imaging on Admission: I have personally reviewed following labs and imaging studies  CBC: Recent Labs  Lab 06/25/21 1432  WBC 7.3  NEUTROABS 5.4  HGB 17.4*  HCT 51.7  MCV 93.5  PLT 99991111   Basic Metabolic Panel: Recent Labs  Lab 06/25/21 1432  NA 135  K 4.1  CL 100  CO2 29  GLUCOSE 94  BUN 21*  CREATININE 1.40*  CALCIUM 8.7*   GFR: Estimated Creatinine Clearance: 71.7 mL/min (A) (by C-G formula based on SCr of 1.4 mg/dL (H)). Liver Function Tests: Recent Labs  Lab  06/25/21 1432  AST 31  ALT 23  ALKPHOS 46  BILITOT 1.3*  PROT 7.8  ALBUMIN 4.0   No results for input(s): LIPASE, AMYLASE in the last 168 hours. No results for input(s): AMMONIA in the last 168 hours. Coagulation Profile: Recent Labs  Lab 06/25/21 1649  INR 1.1   Cardiac Enzymes: No results for input(s): CKTOTAL, CKMB, CKMBINDEX, TROPONINI in the last 168 hours. BNP (last 3 results) No results for input(s): PROBNP in the last 8760 hours. HbA1C: No results for input(s): HGBA1C in the last 72 hours. CBG: No results for input(s): GLUCAP in the last 168 hours. Lipid Profile: No results for input(s): CHOL, HDL, LDLCALC, TRIG, CHOLHDL, LDLDIRECT in the last 72 hours. Thyroid Function Tests: No results for input(s): TSH, T4TOTAL, FREET4, T3FREE, THYROIDAB in the last 72 hours. Anemia Panel: No results for input(s): VITAMINB12, FOLATE,  FERRITIN, TIBC, IRON, RETICCTPCT in the last 72 hours. Urine analysis:    Component Value Date/Time   COLORURINE YELLOW 06/25/2021 1618   APPEARANCEUR CLEAR 06/25/2021 1618   LABSPEC 1.025 06/25/2021 1618   PHURINE 6.5 06/25/2021 1618   GLUCOSEU 100 (A) 06/25/2021 1618   HGBUR NEGATIVE 06/25/2021 1618   BILIRUBINUR NEGATIVE 06/25/2021 1618   KETONESUR NEGATIVE 06/25/2021 1618   PROTEINUR NEGATIVE 06/25/2021 1618   NITRITE NEGATIVE 06/25/2021 1618   LEUKOCYTESUR NEGATIVE 06/25/2021 1618   Sepsis Labs: @LABRCNTIP (procalcitonin:4,lacticidven:4) )No results found for this or any previous visit (from the past 240 hour(s)).   Radiological Exams on Admission: CT Angio Chest PE W/Cm &/Or Wo Cm  Result Date: 06/25/2021 CLINICAL DATA:  Shortness of breath and hypoxia. Atrial fibrillation. Lower abdominal pain. Dark urine. High probability for pulmonary embolism. EXAM: CT ANGIOGRAPHY CHEST CT ABDOMEN AND PELVIS WITH CONTRAST TECHNIQUE: Multidetector CT imaging of the chest was performed using the standard protocol during bolus administration of  intravenous contrast. Multiplanar CT image reconstructions and MIPs were obtained to evaluate the vascular anatomy. Multidetector CT imaging of the abdomen and pelvis was performed using the standard protocol during bolus administration of intravenous contrast. RADIATION DOSE REDUCTION: This exam was performed according to the departmental dose-optimization program which includes automated exposure control, adjustment of the mA and/or kV according to patient size and/or use of iterative reconstruction technique. CONTRAST:  185mL OMNIPAQUE IOHEXOL 350 MG/ML SOLN COMPARISON:  05/03/2019 FINDINGS: CTA CHEST FINDINGS Cardiovascular: Nonocclusive pulmonary embolism is seen in the segmental branches of the bilateral upper, right middle, and right lower lobar pulmonary arteries. No evidence of thoracic aortic aneurysm or dissection. Mediastinum/Nodes: No masses or pathologically enlarged lymph nodes identified. Lungs/Pleura: No pulmonary mass, infiltrate, or effusion. Mild atelectasis or scarring in both lower lungs. Mild centrilobular emphysema also noted. Musculoskeletal: No suspicious bone lesions identified. Review of the MIP images confirms the above findings. CT ABDOMEN and PELVIS FINDINGS Hepatobiliary: No hepatic masses identified. Gallbladder is unremarkable. No evidence of biliary ductal dilatation. Pancreas:  No mass or inflammatory changes. Spleen: Within normal limits in size and appearance. Adrenals/Urinary Tract: No masses identified. Tiny punctate calculus noted in lower pole of right kidney. No evidence of ureteral calculi or hydronephrosis. Stomach/Bowel: Mild sigmoid diverticulitis is demonstrated. No evidence of abscess or bowel perforation. Vascular/Lymphatic: No pathologically enlarged lymph nodes. No acute vascular findings. Aortic atherosclerotic calcification incidentally noted. Reproductive:  No mass or other significant abnormality. Other:  None. Musculoskeletal:  No suspicious bone lesions  identified. Review of the MIP images confirms the above findings. IMPRESSION: Nonocclusive pulmonary embolism in bilateral segmental pulmonary artery branches. Mild sigmoid diverticulitis. No evidence of abscess or bowel perforation. Tiny nonobstructing right renal calculus. Aortic Atherosclerosis (ICD10-I70.0) and Emphysema (ICD10-J43.9). Critical Value/emergent results were called by telephone at the time of interpretation on 06/25/2021 at 4:55 pm to provider [facility] , who verbally acknowledged these results. Electronically Signed   By: Marlaine Hind M.D.   On: 06/25/2021 16:56   CT Abdomen Pelvis W Contrast  Result Date: 06/25/2021 CLINICAL DATA:  Shortness of breath and hypoxia. Atrial fibrillation. Lower abdominal pain. Dark urine. High probability for pulmonary embolism. EXAM: CT ANGIOGRAPHY CHEST CT ABDOMEN AND PELVIS WITH CONTRAST TECHNIQUE: Multidetector CT imaging of the chest was performed using the standard protocol during bolus administration of intravenous contrast. Multiplanar CT image reconstructions and MIPs were obtained to evaluate the vascular anatomy. Multidetector CT imaging of the abdomen and pelvis was performed using the standard protocol during bolus  administration of intravenous contrast. RADIATION DOSE REDUCTION: This exam was performed according to the departmental dose-optimization program which includes automated exposure control, adjustment of the mA and/or kV according to patient size and/or use of iterative reconstruction technique. CONTRAST:  153mL OMNIPAQUE IOHEXOL 350 MG/ML SOLN COMPARISON:  05/03/2019 FINDINGS: CTA CHEST FINDINGS Cardiovascular: Nonocclusive pulmonary embolism is seen in the segmental branches of the bilateral upper, right middle, and right lower lobar pulmonary arteries. No evidence of thoracic aortic aneurysm or dissection. Mediastinum/Nodes: No masses or pathologically enlarged lymph nodes identified. Lungs/Pleura: No pulmonary mass, infiltrate, or  effusion. Mild atelectasis or scarring in both lower lungs. Mild centrilobular emphysema also noted. Musculoskeletal: No suspicious bone lesions identified. Review of the MIP images confirms the above findings. CT ABDOMEN and PELVIS FINDINGS Hepatobiliary: No hepatic masses identified. Gallbladder is unremarkable. No evidence of biliary ductal dilatation. Pancreas:  No mass or inflammatory changes. Spleen: Within normal limits in size and appearance. Adrenals/Urinary Tract: No masses identified. Tiny punctate calculus noted in lower pole of right kidney. No evidence of ureteral calculi or hydronephrosis. Stomach/Bowel: Mild sigmoid diverticulitis is demonstrated. No evidence of abscess or bowel perforation. Vascular/Lymphatic: No pathologically enlarged lymph nodes. No acute vascular findings. Aortic atherosclerotic calcification incidentally noted. Reproductive:  No mass or other significant abnormality. Other:  None. Musculoskeletal:  No suspicious bone lesions identified. Review of the MIP images confirms the above findings. IMPRESSION: Nonocclusive pulmonary embolism in bilateral segmental pulmonary artery branches. Mild sigmoid diverticulitis. No evidence of abscess or bowel perforation. Tiny nonobstructing right renal calculus. Aortic Atherosclerosis (ICD10-I70.0) and Emphysema (ICD10-J43.9). Critical Value/emergent results were called by telephone at the time of interpretation on 06/25/2021 at 4:55 pm to provider [facility] , who verbally acknowledged these results. Electronically Signed   By: Marlaine Hind M.D.   On: 06/25/2021 16:56   DG Chest Port 1 View  Result Date: 06/25/2021 CLINICAL DATA:  Shortness of breath. History of atrial fibrillation. EXAM: PORTABLE CHEST 1 VIEW COMPARISON:  12/12/2019 FINDINGS: The cardiomediastinal silhouette is unremarkable. Mild bibasilar atelectasis/scarring is noted, increased on the RIGHT. No airspace disease, pleural effusion or pneumothorax noted. No acute bony  abnormalities are present. IMPRESSION: Mild bibasilar atelectasis/scarring, increased on the RIGHT. Electronically Signed   By: Margarette Canada M.D.   On: 06/25/2021 15:30    EKG: Independently reviewed. Sinus rhythm.   Assessment/Plan   1. Pulmonary embolism; acute hypoxic respiratory failure   - Presents for lower abd pain, noted to be hypoxic, and found to have bilateral non-occlusive segmental PE  - He is hemodynamically-stable and has normal BNP and troponin  - IV heparin was started in ED   - Continue IV heparin for now, check echo and LE venous dopplers   2. Sigmoid diverticulitis  - Presents with 2 days lower abdominal pain and found to have mild sigmoid diverticulitis on CT without abscess or perforation  - Rocephin and Flagyl were started in ED, will continue   3. Atrial fibrillation  - Had not been on anticoagulant d/t CHADS-VASc of 0  - Continue dofetilide and metoprolol    4. CKD II  - SCr is 1.40 on admission, up from 1.0 last August  - Renally-dose medications, monitor    5. Alcohol abuse  - Admits to a 6-pack some nights, but drinks more than that per his sister  - Monitor for withdrawal, use Ativan if needed     DVT prophylaxis: IV heparin  Code Status: Full  Level of Care: Level of care: Progressive Family  Communication: none present  Disposition Plan:  Patient is from: home  Anticipated d/c is to: home  Anticipated d/c date is: 06/27/21  Patient currently: Pending transition to oral antibiotics and oral anticoagulant, improved oxygenation, may need home O2  Consults called: none  Admission status: Inpatient     Vianne Bulls, MD [facility]  06/25/2021, 11:45 PM

## 2021-06-25 NOTE — Care Management (Signed)
Patient presented to urgent care initially with urinary symptoms, noted to have hypoxia and sent to ED. CT chet revealed PE. Patient has a history of COPD, afib, not sure why not anticoagulated.    He is starting heparin and will need eligibility/ savings card  for DOAC.  CM will follow for needs, recommendations, and transitions.

## 2021-06-25 NOTE — Progress Notes (Signed)
ANTICOAGULATION CONSULT NOTE - Initial Consult  Pharmacy Consult for Heparin Indication: pulmonary embolus  No Known Allergies  Patient Measurements: Height: 5\' 10"  (177.8 cm) Weight: 113.4 kg (250 lb) IBW/kg (Calculated) : 73 Heparin Dosing Weight: 97.9 kg  Vital Signs: Temp: 97.5 F (36.4 C) (05/20 1429) Temp Source: Oral (05/20 1429) BP: 119/70 (05/20 1700) Pulse Rate: 81 (05/20 1700)  Labs: Recent Labs    06/25/21 1432 06/25/21 1649  HGB 17.4*  --   HCT 51.7  --   PLT 198  --   LABPROT  --  14.0  INR  --  1.1  CREATININE 1.40*  --   TROPONINIHS 3  --     Estimated Creatinine Clearance: 71.7 mL/min (A) (by C-G formula based on SCr of 1.4 mg/dL (H)).   Medical History: Past Medical History:  Diagnosis Date   Chronic diastolic CHF (congestive heart failure) (HCC)    COPD (chronic obstructive pulmonary disease) (HCC)    Hypertension    Obesity (BMI 30-39.9) 12/15/2016   OSA (obstructive sleep apnea)    Persistent atrial fibrillation ([place]) 12/15/2016   Visit for monitoring Tikosyn therapy     Medications:  (Not in a hospital admission)  Scheduled:  Infusions:   cefTRIAXone (ROCEPHIN)  IV     And   metronidazole     PRN:   Assessment: 67 yom with a history of COPD, AF, OSA, HF. Patient is presenting with abd pain and discomfort. Heparin per pharmacy consult placed for pulmonary embolus.  CTA PE with Nonocclusive pulmonary embolism in bilateral segmental pulmonary artery branches  Patient is not on anticoagulation prior to arrival.  Hgb 17.4; plt 198  Goal of Therapy:  Heparin level 0.3-0.7 units/ml Monitor platelets by anticoagulation protocol: Yes   Plan:  Give 6000 units bolus x 1 Start heparin infusion at 1750 units/hr Check anti-Xa level at 0000 and daily while on heparin Continue to monitor H&H and platelets  Lorelei Pont, PharmD, BCPS 06/25/2021 5:07 PM ED Clinical Pharmacist -  [phone]

## 2021-06-25 NOTE — ED Notes (Signed)
Patient is being discharged from the Urgent Care and sent to the Emergency Department via POV driven by sister. Per Eliezer Lofts NP, patient is in need of higher level of care due to SOB, low SATs and hx of Afib. Patient is aware and verbalizes understanding of plan of care.  Vitals:   06/25/21 1347  BP: 138/74  Pulse: 81  Resp: 19  Temp: (!) 97.5 F (36.4 C)  SpO2: (!) 88%

## 2021-06-25 NOTE — Progress Notes (Addendum)
61 year old with PMH Chronic Diastolic HF, COPD, Obesity, PAF not on anticoagulation, present with lower abdominal pain, PCP prescribe Bactrim to treat him for UTI yesterday. He presented to urgent care today  with persistent abdominal pain and darker urine. [name] He was found to be hypoxic at urgent care.  Refer to [facility] ED and He was found to have BL PE, and Diverticulitis. He was started on Heparin Gtt and IV antibiotics for diverticulitis. He will be accepted to progressive bed, [place]. Vitals are stable. He is requiring 3 L oxygen.

## 2021-06-25 NOTE — ED Triage Notes (Signed)
Pt arrives pov, referral from UC, c/o shob, hx of afib. Also c/o lower abdominal pain, dark urine. O2 86% in triage. Pt placed on 3L o2

## 2021-06-26 ENCOUNTER — Inpatient Hospital Stay (HOSPITAL_COMMUNITY): Payer: Medicaid Other

## 2021-06-26 DIAGNOSIS — I2699 Other pulmonary embolism without acute cor pulmonale: Secondary | ICD-10-CM | POA: Diagnosis not present

## 2021-06-26 DIAGNOSIS — I2694 Multiple subsegmental pulmonary emboli without acute cor pulmonale: Secondary | ICD-10-CM

## 2021-06-26 LAB — BASIC METABOLIC PANEL
Anion gap: 7 (ref 5–15)
BUN: 18 mg/dL (ref 6–20)
CO2: 26 mmol/L (ref 22–32)
Calcium: 8.7 mg/dL — ABNORMAL LOW (ref 8.9–10.3)
Chloride: 104 mmol/L (ref 98–111)
Creatinine, Ser: 1.16 mg/dL (ref 0.61–1.24)
GFR, Estimated: >60 mL/min (ref >60)
Glucose, Bld: 97 mg/dL (ref 70–99)
Potassium: 4.9 mmol/L (ref 3.5–5.1)
Sodium: 137 mmol/L (ref 135–145)

## 2021-06-26 LAB — CBC
HCT: 52.8 % — ABNORMAL HIGH (ref 39.0–52.0)
Hemoglobin: 16.4 g/dL (ref 13.0–17.0)
MCH: 30.7 pg (ref 26.0–34.0)
MCHC: 31.1 g/dL (ref 30.0–36.0)
MCV: 98.7 fL (ref 80.0–100.0)
Platelets: 189 10*3/uL (ref 150–400)
RBC: 5.35 MIL/uL (ref 4.22–5.81)
RDW: 14.7 % (ref 11.5–15.5)
WBC: 6.4 10*3/uL (ref 4.0–10.5)
nRBC: 0 % (ref 0.0–0.2)

## 2021-06-26 LAB — HEPATIC FUNCTION PANEL
ALT: 21 U/L (ref 0–44)
AST: 27 U/L (ref 15–41)
Albumin: 3.9 g/dL (ref 3.5–5.0)
Alkaline Phosphatase: 43 U/L (ref 38–126)
Bilirubin, Direct: 0.2 mg/dL (ref 0.0–0.2)
Indirect Bilirubin: 0.4 mg/dL (ref 0.3–0.9)
Total Bilirubin: 0.6 mg/dL (ref 0.3–1.2)
Total Protein: 7.8 g/dL (ref 6.5–8.1)

## 2021-06-26 LAB — ECHOCARDIOGRAM COMPLETE
AR max vel: 3.12 cm2
AV Area VTI: 3.01 cm2
AV Area mean vel: 3.13 cm2
AV Mean grad: 4 mmHg
AV Peak grad: 6.8 mmHg
Ao pk vel: 1.3 m/s
Area-P 1/2: 5.13 cm2
Height: 70 in
S' Lateral: 2.6 cm
Weight: 4000 oz

## 2021-06-26 LAB — HEPARIN LEVEL (UNFRACTIONATED)
Heparin Unfractionated: 0.41 IU/mL (ref 0.30–0.70)
Heparin Unfractionated: 0.3 IU/mL (ref 0.30–0.70)
Heparin Unfractionated: 0.64 IU/mL (ref 0.30–0.70)

## 2021-06-26 LAB — HIV ANTIBODY (ROUTINE TESTING W REFLEX): HIV Screen 4th Generation wRfx: NONREACTIVE

## 2021-06-26 MED ORDER — GUAIFENESIN 100 MG/5ML PO LIQD
5.0000 mL | ORAL | Status: DC | PRN
  Administered 2021-06-29 (×2): 5 mL via ORAL
  Filled 2021-06-26 (×2): qty 10

## 2021-06-26 MED ORDER — HYDRALAZINE HCL 20 MG/ML IJ SOLN
10.0000 mg | INTRAMUSCULAR | Status: DC | PRN
Start: 2021-06-26 — End: 2021-06-30

## 2021-06-26 MED ORDER — TRAZODONE HCL 50 MG PO TABS
50.0000 mg | ORAL_TABLET | Freq: Every evening | ORAL | Status: DC | PRN
  Administered 2021-06-26 – 2021-06-30 (×2): 50 mg via ORAL
  Filled 2021-06-26 (×2): qty 1

## 2021-06-26 MED ORDER — METOPROLOL TARTRATE 5 MG/5ML IV SOLN
5.0000 mg | INTRAVENOUS | Status: DC | PRN
Start: 2021-06-26 — End: 2021-06-30

## 2021-06-26 MED ORDER — PANTOPRAZOLE SODIUM 40 MG PO TBEC
40.0000 mg | DELAYED_RELEASE_TABLET | Freq: Every day | ORAL | Status: DC
  Administered 2021-06-26 – 2021-06-30 (×5): 40 mg via ORAL
  Filled 2021-06-26 (×5): qty 1

## 2021-06-26 NOTE — Progress Notes (Signed)
Bilateral lower extremity venous duplex has been completed. Preliminary results can be found in CV Proc through chart review.  Results were given to Dr. Nelson Chimes.  06/26/21 9:59 AM Olen Cordial RVT

## 2021-06-26 NOTE — Progress Notes (Signed)
Pharmacy Consult Note - IV heparin follow up  Labs: heparin level 0.64  A/P: Heparin level therapeutic (goal 0.3-0.7) for PE and rising after rate increased from 1950 to 2150 units/hr this AM. Per RN, no issues or bleeding noted. Continue current IV heparin rate and will recheck heparin level with AM labs.   Adrian Saran, PharmD, BCPS Secure Chat if ?s 06/26/2021 6:33 PM

## 2021-06-26 NOTE — Progress Notes (Signed)
ANTICOAGULATION CONSULT NOTE  Pharmacy Consult for Heparin Indication: pulmonary embolus  No Known Allergies  Patient Measurements: Height: 5\' 10"  (177.8 cm) Weight: 113.4 kg (250 lb) IBW/kg (Calculated) : 73 Heparin Dosing Weight: 97.9 kg  Vital Signs: Temp: 98.5 F (36.9 C) (05/21 0553) Temp Source: Oral (05/21 0553) BP: 130/67 (05/21 0553) Pulse Rate: 74 (05/21 0553)  Labs: Recent Labs    06/25/21 1432 06/25/21 1649 06/25/21 2323 06/26/21 0531 06/26/21 0701  HGB 17.4*  --   --  16.4  --   HCT 51.7  --   --  52.8*  --   PLT 198  --   --  189  --   APTT  --  29  --   --   --   LABPROT  --  14.0  --   --   --   INR  --  1.1  --   --   --   HEPARINUNFRC  --   --  0.27* 0.41 0.30  CREATININE 1.40*  --   --  1.16  --   TROPONINIHS 3 3  --   --   --      Estimated Creatinine Clearance: 86.5 mL/min (by C-G formula based on SCr of 1.16 mg/dL).   Medications:  Medications Prior to Admission  Medication Sig Dispense Refill Last Dose   albuterol (VENTOLIN HFA) 108 (90 Base) MCG/ACT inhaler Inhale 2 puffs into the lungs every 6 (six) hours as needed for wheezing or shortness of breath.   06/25/2021   dofetilide (TIKOSYN) 500 MCG capsule TAKE 1 CAPSULE BY MOUTH 2 TIMES DAILY. 180 capsule 3 06/25/2021   metoprolol succinate (TOPROL-XL) 100 MG 24 hr tablet TAKE 1 TABLET BY MOUTH DAILY. 90 tablet 3 06/24/2021 at 2100   magnesium oxide (MAG-OX) 400 (241.3 Mg) MG tablet Take 1 tablet (400 mg total) by mouth daily. (Patient not taking: Reported on 06/25/2021) 30 tablet 6 Completed Course   ondansetron (ZOFRAN ODT) 4 MG disintegrating tablet Take 1 tablet (4 mg total) by mouth every 8 (eight) hours as needed for nausea or vomiting. (Patient not taking: Reported on 06/25/2021) 20 tablet 0 Completed Course   oxyCODONE-acetaminophen (PERCOCET/ROXICET) 5-325 MG tablet Take 1 tablet by mouth every 6 (six) hours as needed for severe pain. (Patient not taking: Reported on 06/25/2021) 16 tablet 0  Completed Course   torsemide (DEMADEX) 20 MG tablet Take 1 tablet (20 mg total) by mouth 2 (two) times daily. (Patient not taking: Reported on 09/06/2020) 30 tablet 3 Completed Course    Scheduled:   dofetilide  500 mcg Oral BID   folic acid  1 mg Oral Daily   LORazepam  0-4 mg Oral Q6H   Followed by   Melene Muller ON 06/28/2021] LORazepam  0-4 mg Oral Q12H   metoprolol succinate  100 mg Oral Daily   multivitamin with minerals  1 tablet Oral Daily   pantoprazole  40 mg Oral Daily   sodium chloride flush  3 mL Intravenous Q12H   thiamine  100 mg Oral Daily   Or   thiamine  100 mg Intravenous Daily   Infusions:   cefTRIAXone (ROCEPHIN)  IV     heparin 1,950 Units/hr (06/26/21 0604)   metronidazole 500 mg (06/26/21 0454)   PRN:   Assessment: 61 yom with a history of COPD, AFib not on anticoagulation d/t CHADS-VASc of 0, OSA, HF. Patient is presenting with abd pain and discomfort. Heparin per pharmacy consult placed for pulmonary embolus.  CTA PE with Nonocclusive pulmonary embolism in bilateral segmental pulmonary artery branches  06/26/2021 Most recent heparin level improved to therapeutic but borderline low on 1950 units/hr CBC: Hgb Plt remain stable WNL Mild AKI on admission, now resolving (baseline ~1.0) No bleeding or infusion issues per RN  Goal of Therapy:  Heparin level 0.3-0.7 units/ml Monitor platelets by anticoagulation protocol: Yes   Plan:  Increase heparin drip to 2150 units/hr (10% incr just to keep therapeutic) Confirmatory heparin level in 6-8hr Daily CBC Continue to monitor H&H and platelets  Reuel Boom, PharmD, BCPS [phone] 06/26/2021, 8:43 AM

## 2021-06-26 NOTE — Plan of Care (Signed)

## 2021-06-26 NOTE — Progress Notes (Signed)
PROGRESS NOTE    [name] [name]  KNL:[id number] DOB: [birth year] DOA: 06/25/2021 PCP: Kaleen Mask, MD   Brief Narrative:  61 year old with history of atrial fibrillation not on anticoagulation, obesity, COPD, alcohol use came to the ED with complaints of abdominal pain and shortness of breath.  The symptoms have been ongoing for the past 2 days and outpatient there is suspicion for bladder infection therefore was started on Bactrim.  In ED CTA chest showed bilateral pulmonary embolism, CT of the abdomen pelvis showed mild uncomplicated diverticulitis and nonobstructive renal stone.   Assessment & Plan:  Principal Problem:   PE (pulmonary thromboembolism) (HCC) Active Problems:   Persistent atrial fibrillation (HCC)   Sigmoid diverticulitis   CKD (chronic kidney disease), stage II   OSA (obstructive sleep apnea)     Pulmonary embolism; acute hypoxic respiratory failure without evidence of cor pulmonale Nonocclusive bilateral pulmonary embolism seen on the CTA.  Currently on heparin drip with plans to transition to oral anticoagulation. Echocardiogram Lower extremity Dopplers-prelim positive for DVT   Sigmoid diverticulitis, uncomplicated -Diet as tolerated.  IV fluids.  On IV Rocephin and Flagyl.   Atrial fibrillation  CHADS-VASc of 0 therefore not on anticoagulation but now he will be due to pulmonary embolism.  Continue his Tikosyn and Toprol-XL   Acute kidney injury on CKD stage II, resolved -Admission creatinine 1.4.  Baseline 1.1  Alcohol abuse  -Alcohol withdrawal protocol     DVT prophylaxis: Heparin drip Code Status: Full code Family Communication:    Status is: Inpatient Remains inpatient appropriate because: Maintain hospital stay until his pulmonary embolism work-up has been completed, he is consistently tolerating oral diet.   Subjective:  Patient still has mild lower abdominal pain but significantly improved compared to yesterday.  Still  exertional dyspnea.  Examination:  General exam: Appears calm and comfortable, 2 L nasal cannula Respiratory system: Clear to auscultation. Respiratory effort normal. Cardiovascular system: S1 & S2 heard, RRR. No JVD, murmurs, rubs, gallops or clicks. No pedal edema. Gastrointestinal system: Abdomen is nondistended, soft and nontender. No organomegaly or masses felt. Normal bowel sounds heard. Central nervous system: Alert and oriented. No focal neurological deficits. Extremities: Symmetric 5 x 5 power. Skin: No rashes, lesions or ulcers Psychiatry: Judgement and insight appear normal. Mood & affect appropriate.     Objective: Vitals:   06/25/21 2300 06/26/21 0000 06/26/21 0138 06/26/21 0553  BP:   129/80 130/67  Pulse:   80 74  Resp: (!) 33 20 19 17   Temp:   98.1 F (36.7 C) 98.5 F (36.9 C)  TempSrc:   Oral Oral  SpO2:   93% 98%  Weight:      Height:        Intake/Output Summary (Last 24 hours) at 06/26/2021 0811 Last data filed at 06/26/2021 0500 Gross per 24 hour  Intake 966.23 ml  Output 650 ml  Net 316.23 ml   Filed Weights   06/25/21 1423  Weight: 113.4 kg     Data Reviewed:   CBC: Recent Labs  Lab 06/25/21 1432 06/26/21 0531  WBC 7.3 6.4  NEUTROABS 5.4  --   HGB 17.4* 16.4  HCT 51.7 52.8*  MCV 93.5 98.7  PLT 198 189   Basic Metabolic Panel: Recent Labs  Lab 06/25/21 1432 06/26/21 0531  NA 135 137  K 4.1 4.9  CL 100 104  CO2 29 26  GLUCOSE 94 97  BUN 21* 18  CREATININE 1.40* 1.16  CALCIUM 8.7* 8.7*  GFR: Estimated Creatinine Clearance: 86.5 mL/min (by C-G formula based on SCr of 1.16 mg/dL). Liver Function Tests: Recent Labs  Lab 06/25/21 1432 06/26/21 0531  AST 31 27  ALT 23 21  ALKPHOS 46 43  BILITOT 1.3* 0.6  PROT 7.8 7.8  ALBUMIN 4.0 3.9   No results for input(s): LIPASE, AMYLASE in the last 168 hours. No results for input(s): AMMONIA in the last 168 hours. Coagulation Profile: Recent Labs  Lab 06/25/21 1649  INR 1.1    Cardiac Enzymes: No results for input(s): CKTOTAL, CKMB, CKMBINDEX, TROPONINI in the last 168 hours. BNP (last 3 results) No results for input(s): PROBNP in the last 8760 hours. HbA1C: No results for input(s): HGBA1C in the last 72 hours. CBG: No results for input(s): GLUCAP in the last 168 hours. Lipid Profile: No results for input(s): CHOL, HDL, LDLCALC, TRIG, CHOLHDL, LDLDIRECT in the last 72 hours. Thyroid Function Tests: No results for input(s): TSH, T4TOTAL, FREET4, T3FREE, THYROIDAB in the last 72 hours. Anemia Panel: No results for input(s): VITAMINB12, FOLATE, FERRITIN, TIBC, IRON, RETICCTPCT in the last 72 hours. Sepsis Labs: Recent Labs  Lab 06/25/21 1749  LATICACIDVEN 1.3    No results found for this or any previous visit (from the past 240 hour(s)).       Radiology Studies: CT Angio Chest PE W/Cm &/Or Wo Cm  Result Date: 06/25/2021 CLINICAL DATA:  Shortness of breath and hypoxia. Atrial fibrillation. Lower abdominal pain. Dark urine. High probability for pulmonary embolism. EXAM: CT ANGIOGRAPHY CHEST CT ABDOMEN AND PELVIS WITH CONTRAST TECHNIQUE: Multidetector CT imaging of the chest was performed using the standard protocol during bolus administration of intravenous contrast. Multiplanar CT image reconstructions and MIPs were obtained to evaluate the vascular anatomy. Multidetector CT imaging of the abdomen and pelvis was performed using the standard protocol during bolus administration of intravenous contrast. RADIATION DOSE REDUCTION: This exam was performed according to the departmental dose-optimization program which includes automated exposure control, adjustment of the mA and/or kV according to patient size and/or use of iterative reconstruction technique. CONTRAST:  171mL OMNIPAQUE IOHEXOL 350 MG/ML SOLN COMPARISON:  05/03/2019 FINDINGS: CTA CHEST FINDINGS Cardiovascular: Nonocclusive pulmonary embolism is seen in the segmental branches of the bilateral upper,  right middle, and right lower lobar pulmonary arteries. No evidence of thoracic aortic aneurysm or dissection. Mediastinum/Nodes: No masses or pathologically enlarged lymph nodes identified. Lungs/Pleura: No pulmonary mass, infiltrate, or effusion. Mild atelectasis or scarring in both lower lungs. Mild centrilobular emphysema also noted. Musculoskeletal: No suspicious bone lesions identified. Review of the MIP images confirms the above findings. CT ABDOMEN and PELVIS FINDINGS Hepatobiliary: No hepatic masses identified. Gallbladder is unremarkable. No evidence of biliary ductal dilatation. Pancreas:  No mass or inflammatory changes. Spleen: Within normal limits in size and appearance. Adrenals/Urinary Tract: No masses identified. Tiny punctate calculus noted in lower pole of right kidney. No evidence of ureteral calculi or hydronephrosis. Stomach/Bowel: Mild sigmoid diverticulitis is demonstrated. No evidence of abscess or bowel perforation. Vascular/Lymphatic: No pathologically enlarged lymph nodes. No acute vascular findings. Aortic atherosclerotic calcification incidentally noted. Reproductive:  No mass or other significant abnormality. Other:  None. Musculoskeletal:  No suspicious bone lesions identified. Review of the MIP images confirms the above findings. IMPRESSION: Nonocclusive pulmonary embolism in bilateral segmental pulmonary artery branches. Mild sigmoid diverticulitis. No evidence of abscess or bowel perforation. Tiny nonobstructing right renal calculus. Aortic Atherosclerosis (ICD10-I70.0) and Emphysema (ICD10-J43.9). Critical Value/emergent results were called by telephone at the time of interpretation on 06/25/2021 at  4:55 pm to provider Suella Broad , who verbally acknowledged these results. Electronically Signed   By: Marlaine Hind M.D.   On: 06/25/2021 16:56   CT Abdomen Pelvis W Contrast  Result Date: 06/25/2021 CLINICAL DATA:  Shortness of breath and hypoxia. Atrial fibrillation. Lower  abdominal pain. Dark urine. High probability for pulmonary embolism. EXAM: CT ANGIOGRAPHY CHEST CT ABDOMEN AND PELVIS WITH CONTRAST TECHNIQUE: Multidetector CT imaging of the chest was performed using the standard protocol during bolus administration of intravenous contrast. Multiplanar CT image reconstructions and MIPs were obtained to evaluate the vascular anatomy. Multidetector CT imaging of the abdomen and pelvis was performed using the standard protocol during bolus administration of intravenous contrast. RADIATION DOSE REDUCTION: This exam was performed according to the departmental dose-optimization program which includes automated exposure control, adjustment of the mA and/or kV according to patient size and/or use of iterative reconstruction technique. CONTRAST:  180mL OMNIPAQUE IOHEXOL 350 MG/ML SOLN COMPARISON:  05/03/2019 FINDINGS: CTA CHEST FINDINGS Cardiovascular: Nonocclusive pulmonary embolism is seen in the segmental branches of the bilateral upper, right middle, and right lower lobar pulmonary arteries. No evidence of thoracic aortic aneurysm or dissection. Mediastinum/Nodes: No masses or pathologically enlarged lymph nodes identified. Lungs/Pleura: No pulmonary mass, infiltrate, or effusion. Mild atelectasis or scarring in both lower lungs. Mild centrilobular emphysema also noted. Musculoskeletal: No suspicious bone lesions identified. Review of the MIP images confirms the above findings. CT ABDOMEN and PELVIS FINDINGS Hepatobiliary: No hepatic masses identified. Gallbladder is unremarkable. No evidence of biliary ductal dilatation. Pancreas:  No mass or inflammatory changes. Spleen: Within normal limits in size and appearance. Adrenals/Urinary Tract: No masses identified. Tiny punctate calculus noted in lower pole of right kidney. No evidence of ureteral calculi or hydronephrosis. Stomach/Bowel: Mild sigmoid diverticulitis is demonstrated. No evidence of abscess or bowel perforation.  Vascular/Lymphatic: No pathologically enlarged lymph nodes. No acute vascular findings. Aortic atherosclerotic calcification incidentally noted. Reproductive:  No mass or other significant abnormality. Other:  None. Musculoskeletal:  No suspicious bone lesions identified. Review of the MIP images confirms the above findings. IMPRESSION: Nonocclusive pulmonary embolism in bilateral segmental pulmonary artery branches. Mild sigmoid diverticulitis. No evidence of abscess or bowel perforation. Tiny nonobstructing right renal calculus. Aortic Atherosclerosis (ICD10-I70.0) and Emphysema (ICD10-J43.9). Critical Value/emergent results were called by telephone at the time of interpretation on 06/25/2021 at 4:55 pm to provider [facility] , who verbally acknowledged these results. Electronically Signed   By: Marlaine Hind M.D.   On: 06/25/2021 16:56   DG Chest Port 1 View  Result Date: 06/25/2021 CLINICAL DATA:  Shortness of breath. History of atrial fibrillation. EXAM: PORTABLE CHEST 1 VIEW COMPARISON:  12/12/2019 FINDINGS: The cardiomediastinal silhouette is unremarkable. Mild bibasilar atelectasis/scarring is noted, increased on the RIGHT. No airspace disease, pleural effusion or pneumothorax noted. No acute bony abnormalities are present. IMPRESSION: Mild bibasilar atelectasis/scarring, increased on the RIGHT. Electronically Signed   By: Margarette Canada M.D.   On: 06/25/2021 15:30        Scheduled Meds:  dofetilide  500 mcg Oral BID   folic acid  1 mg Oral Daily   LORazepam  0-4 mg Oral Q6H   Followed by   Derrill Memo ON 06/28/2021] LORazepam  0-4 mg Oral Q12H   metoprolol succinate  100 mg Oral Daily   multivitamin with minerals  1 tablet Oral Daily   sodium chloride flush  3 mL Intravenous Q12H   thiamine  100 mg Oral Daily   Or   thiamine  100  mg Intravenous Daily   Continuous Infusions:  cefTRIAXone (ROCEPHIN)  IV     heparin 1,950 Units/hr (06/26/21 0604)   metronidazole 500 mg (06/26/21 0454)      LOS: 1 day   Time spent= 35 mins    Sona Nations Arsenio Loader, MD [facility]  If 7PM-7AM, please contact night-coverage  06/26/2021, 8:11 AM

## 2021-06-26 NOTE — Progress Notes (Signed)
ANTICOAGULATION CONSULT NOTE - follow up  Pharmacy Consult for Heparin Indication: pulmonary embolus  No Known Allergies  Patient Measurements: Height: 5\' 10"  (177.8 cm) Weight: 113.4 kg (250 lb) IBW/kg (Calculated) : 73 Heparin Dosing Weight: 97.9 kg  Vital Signs: Temp: 97.9 F (36.6 C) (05/20 2152) Temp Source: Oral (05/20 2152) BP: 131/79 (05/20 2152) Pulse Rate: 78 (05/20 2152)  Labs: Recent Labs    06/25/21 1432 06/25/21 1649 06/25/21 2323  HGB 17.4*  --   --   HCT 51.7  --   --   PLT 198  --   --   APTT  --  29  --   LABPROT  --  14.0  --   INR  --  1.1  --   HEPARINUNFRC  --   --  0.27*  CREATININE 1.40*  --   --   TROPONINIHS 3 3  --      Estimated Creatinine Clearance: 71.7 mL/min (A) (by C-G formula based on SCr of 1.4 mg/dL (H)).   Medical History: Past Medical History:  Diagnosis Date   Chronic diastolic CHF (congestive heart failure) (HCC)    COPD (chronic obstructive pulmonary disease) (HCC)    Hypertension    Obesity (BMI 30-39.9) 12/15/2016   OSA (obstructive sleep apnea)    Persistent atrial fibrillation (HCC) 12/15/2016   Visit for monitoring Tikosyn therapy     Medications:  Medications Prior to Admission  Medication Sig Dispense Refill Last Dose   albuterol (VENTOLIN HFA) 108 (90 Base) MCG/ACT inhaler Inhale 2 puffs into the lungs every 6 (six) hours as needed for wheezing or shortness of breath.   06/25/2021   dofetilide (TIKOSYN) 500 MCG capsule TAKE 1 CAPSULE BY MOUTH 2 TIMES DAILY. 180 capsule 3 06/25/2021   metoprolol succinate (TOPROL-XL) 100 MG 24 hr tablet TAKE 1 TABLET BY MOUTH DAILY. 90 tablet 3 06/24/2021 at 2100   magnesium oxide (MAG-OX) 400 (241.3 Mg) MG tablet Take 1 tablet (400 mg total) by mouth daily. (Patient not taking: Reported on 06/25/2021) 30 tablet 6 Completed Course   ondansetron (ZOFRAN ODT) 4 MG disintegrating tablet Take 1 tablet (4 mg total) by mouth every 8 (eight) hours as needed for nausea or vomiting. (Patient  not taking: Reported on 06/25/2021) 20 tablet 0 Completed Course   oxyCODONE-acetaminophen (PERCOCET/ROXICET) 5-325 MG tablet Take 1 tablet by mouth every 6 (six) hours as needed for severe pain. (Patient not taking: Reported on 06/25/2021) 16 tablet 0 Completed Course   torsemide (DEMADEX) 20 MG tablet Take 1 tablet (20 mg total) by mouth 2 (two) times daily. (Patient not taking: Reported on 09/06/2020) 30 tablet 3 Completed Course    Scheduled:   dofetilide  500 mcg Oral BID   folic acid  1 mg Oral Daily   LORazepam  0-4 mg Oral Q6H   Followed by   Melene Muller ON 06/28/2021] LORazepam  0-4 mg Oral Q12H   metoprolol succinate  100 mg Oral Daily   multivitamin with minerals  1 tablet Oral Daily   sodium chloride flush  3 mL Intravenous Q12H   thiamine  100 mg Oral Daily   Or   thiamine  100 mg Intravenous Daily   Infusions:   sodium chloride     cefTRIAXone (ROCEPHIN)  IV     heparin 1,750 Units/hr (06/25/21 1755)   metronidazole     PRN:   Assessment: 61 yom with a history of COPD, AF, OSA, HF. Patient is presenting with abd pain and  discomfort. Heparin per pharmacy consult placed for pulmonary embolus.  CTA PE with Nonocclusive pulmonary embolism in bilateral segmental pulmonary artery branches  Patient is not on anticoagulation prior to arrival.  06/26/2021 HL 0.27 subtherapeutic on 1750 units/hr Per RN no bleeding noted  Goal of Therapy:  Heparin level 0.3-0.7 units/ml Monitor platelets by anticoagulation protocol: Yes   Plan:  increase heparin drip to 1950 units/hr 6 hour heparin level Daily CBC Continue to monitor H&H and platelets  Dolly Rias RPh 06/26/2021, 12:25 AM

## 2021-06-26 NOTE — Progress Notes (Signed)
Echocardiogram 2D Echocardiogram has been performed.  [name] [name] 06/26/2021, 12:27 PM

## 2021-06-27 DIAGNOSIS — I2699 Other pulmonary embolism without acute cor pulmonale: Secondary | ICD-10-CM | POA: Diagnosis not present

## 2021-06-27 LAB — CBC
HCT: 47.8 % (ref 39.0–52.0)
Hemoglobin: 15.4 g/dL (ref 13.0–17.0)
MCH: 31.2 pg (ref 26.0–34.0)
MCHC: 32.2 g/dL (ref 30.0–36.0)
MCV: 97 fL (ref 80.0–100.0)
Platelets: 188 10*3/uL (ref 150–400)
RBC: 4.93 MIL/uL (ref 4.22–5.81)
RDW: 14.4 % (ref 11.5–15.5)
WBC: 6.1 10*3/uL (ref 4.0–10.5)
nRBC: 0 % (ref 0.0–0.2)

## 2021-06-27 LAB — BASIC METABOLIC PANEL
Anion gap: 7 (ref 5–15)
BUN: 13 mg/dL (ref 6–20)
CO2: 30 mmol/L (ref 22–32)
Calcium: 8.6 mg/dL — ABNORMAL LOW (ref 8.9–10.3)
Chloride: 98 mmol/L (ref 98–111)
Creatinine, Ser: 1.02 mg/dL (ref 0.61–1.24)
GFR, Estimated: >60 mL/min (ref >60)
Glucose, Bld: 116 mg/dL — ABNORMAL HIGH (ref 70–99)
Potassium: 5 mmol/L (ref 3.5–5.1)
Sodium: 135 mmol/L (ref 135–145)

## 2021-06-27 LAB — LIPASE, BLOOD: Lipase: 37 U/L (ref 11–51)

## 2021-06-27 LAB — HEPARIN LEVEL (UNFRACTIONATED): Heparin Unfractionated: 0.51 IU/mL (ref 0.30–0.70)

## 2021-06-27 LAB — MAGNESIUM: Magnesium: 2.1 mg/dL (ref 1.7–2.4)

## 2021-06-27 MED ORDER — APIXABAN 5 MG PO TABS: 5.0000 mg | ORAL_TABLET | Freq: Two times a day (BID) | ORAL | Status: DC

## 2021-06-27 MED ORDER — ARFORMOTEROL TARTRATE 15 MCG/2ML IN NEBU
15.0000 ug | INHALATION_SOLUTION | Freq: Two times a day (BID) | RESPIRATORY_TRACT | Status: DC
  Administered 2021-06-27 – 2021-06-30 (×6): 15 ug via RESPIRATORY_TRACT
  Filled 2021-06-27 (×6): qty 2

## 2021-06-27 MED ORDER — APIXABAN 5 MG PO TABS
10.0000 mg | ORAL_TABLET | Freq: Two times a day (BID) | ORAL | Status: DC
  Administered 2021-06-27 – 2021-06-30 (×6): 10 mg via ORAL
  Filled 2021-06-27 (×6): qty 2

## 2021-06-27 MED ORDER — REVEFENACIN 175 MCG/3ML IN SOLN
175.0000 ug | Freq: Every day | RESPIRATORY_TRACT | Status: DC
  Administered 2021-06-28 – 2021-06-30 (×3): 175 ug via RESPIRATORY_TRACT
  Filled 2021-06-27 (×4): qty 3

## 2021-06-27 MED ORDER — IPRATROPIUM-ALBUTEROL 0.5-2.5 (3) MG/3ML IN SOLN
3.0000 mL | RESPIRATORY_TRACT | Status: DC | PRN
  Administered 2021-06-29: 3 mL via RESPIRATORY_TRACT
  Filled 2021-06-27: qty 3

## 2021-06-27 MED ORDER — MORPHINE SULFATE (PF) 2 MG/ML IV SOLN
2.0000 mg | INTRAVENOUS | Status: DC | PRN
  Administered 2021-06-27 – 2021-06-30 (×12): 2 mg via INTRAVENOUS
  Filled 2021-06-27 (×12): qty 1

## 2021-06-27 MED ORDER — SALINE SPRAY 0.65 % NA SOLN
1.0000 | NASAL | Status: DC | PRN
  Administered 2021-06-29: 1 via NASAL
  Filled 2021-06-27: qty 44

## 2021-06-27 NOTE — Progress Notes (Signed)
Pharmacy: heparin --> Eliquis  Patient's a 61 y.o M on heparin drip for acute PE and DVT. Pharmacy has been consulted to transition him to Eliquis this evening around 6p (after ~48 hrs of heparin).  Plan: - d/c heparin drip at 6p - start Eliquis 10 mg bid x7 days, then 5 mg bid - pharmacy will sign off, but will follow patient peripherally along with you  Dia Sitter, PharmD, BCPS 06/27/2021 2:24 PM

## 2021-06-27 NOTE — Progress Notes (Signed)
PROGRESS NOTE    [name] [name]  [id number] DOB: [birth year] DOA: 06/25/2021 PCP: Leonard Downing, MD   Brief Narrative:  61 year old with history of atrial fibrillation not on anticoagulation, obesity, COPD, alcohol use came to the ED with complaints of abdominal pain and shortness of breath.  The symptoms have been ongoing for the past 2 days and outpatient there is suspicion for bladder infection therefore was started on Bactrim.  In ED CTA chest showed bilateral pulmonary embolism, CT of the abdomen pelvis showed mild uncomplicated diverticulitis and nonobstructive renal stone.  He lives a quite sedentary lifestyle at home.  Echocardiogram was suggestive of normal EF but reduced RV function, elevated PASP, RV hypokinesia.  Lower extremity Dopplers was negative for left lower extremity DVT.  IR consulted - no need for thrombectomy.    Assessment & Plan:  Principal Problem:   PE (pulmonary thromboembolism) ([place]) Active Problems:   Persistent atrial fibrillation (HCC)   Sigmoid diverticulitis   CKD (chronic kidney disease), stage II   OSA (obstructive sleep apnea)     Pulmonary embolism; acute hypoxic respiratory failure without evidence of cor pulmonale Nonocclusive bilateral pulmonary embolism seen on the CTA.  Currently on heparin drip with plans to transition to oral anticoagulation.  Echocardiogram was suggestive of normal EF but reduced RV function, elevated PASP, RV hypokinesia.  Lower extremity Dopplers was negative for left lower extremity DVT.  Spoke with IR Dr Denna Haggard, appreciate his input, no role for catheter guided thrombectomy at this point.  Bronchodialtors added.    Sigmoid diverticulitis, uncomplicated Severe Intermittent Abd  pain -Diet as tolerated.  IV fluids.  On IV Rocephin and Flagyl. Lipase is normal.    Atrial fibrillation  CHADS-VASc of 0 therefore not on anticoagulation but now he will be due to pulmonary embolism.  Continue his Tikosyn and  Toprol-XL   Acute kidney injury on CKD stage II, resolved -Admission creatinine 1.4.  Baseline 1.1  Alcohol abuse  -Alcohol withdrawal protocol     DVT prophylaxis: Heparin drip Code Status: Full code Family Communication:    Status is: Inpatient Remains inpatient appropriate because: Maintain hospital stay until his pulmonary embolism work-up has been completed, he is consistently tolerating oral diet.  He is not on any home oxygen, currently requiring 3-4 L nasal cannula.   Subjective:  Still has exertional SOB, abd pain is better. No acute events overnight.   Examination: Constitutional: Not in acute distress. 3L [place] Respiratory: mild b/l diffuse rhonchi Cardiovascular: Normal sinus rhythm, no rubs Abdomen: Nontender nondistended good bowel sounds Musculoskeletal: No edema noted Skin: No rashes seen Neurologic: CN 2-12 grossly intact.  And nonfocal Psychiatric: Normal judgment and insight. Alert and oriented x 3. Normal mood.     Objective: Vitals:   06/26/21 2030 06/26/21 2227 06/27/21 0440 06/27/21 0626  BP: 132/78  131/61   Pulse: 79  80 82  Resp: 20 (!) 25 20   Temp: 98.3 F (36.8 C)  99 F (37.2 C)   TempSrc: Oral  Oral   SpO2: 93% 90% 92%   Weight:      Height:        Intake/Output Summary (Last 24 hours) at 06/27/2021 0751 Last data filed at 06/27/2021 0700 Gross per 24 hour  Intake 2190.48 ml  Output 2500 ml  Net -309.52 ml   Filed Weights   06/25/21 1423  Weight: 113.4 kg     Data Reviewed:   CBC: Recent Labs  Lab 06/25/21 1432 06/26/21 0531  06/27/21 0352  WBC 7.3 6.4 6.1  NEUTROABS 5.4  --   --   HGB 17.4* 16.4 15.4  HCT 51.7 52.8* 47.8  MCV 93.5 98.7 97.0  PLT 198 189 0000000   Basic Metabolic Panel: Recent Labs  Lab 06/25/21 1432 06/26/21 0531 06/27/21 0352  NA 135 137 135  K 4.1 4.9 5.0  CL 100 104 98  CO2 29 26 30   GLUCOSE 94 97 116*  BUN 21* 18 13  CREATININE 1.40* 1.16 1.02  CALCIUM 8.7* 8.7* 8.6*  MG  --   --  2.1    GFR: Estimated Creatinine Clearance: 98.4 mL/min (by C-G formula based on SCr of 1.02 mg/dL). Liver Function Tests: Recent Labs  Lab 06/25/21 1432 06/26/21 0531  AST 31 27  ALT 23 21  ALKPHOS 46 43  BILITOT 1.3* 0.6  PROT 7.8 7.8  ALBUMIN 4.0 3.9   No results for input(s): LIPASE, AMYLASE in the last 168 hours. No results for input(s): AMMONIA in the last 168 hours. Coagulation Profile: Recent Labs  Lab 06/25/21 1649  INR 1.1   Cardiac Enzymes: No results for input(s): CKTOTAL, CKMB, CKMBINDEX, TROPONINI in the last 168 hours. BNP (last 3 results) No results for input(s): PROBNP in the last 8760 hours. HbA1C: No results for input(s): HGBA1C in the last 72 hours. CBG: No results for input(s): GLUCAP in the last 168 hours. Lipid Profile: No results for input(s): CHOL, HDL, LDLCALC, TRIG, CHOLHDL, LDLDIRECT in the last 72 hours. Thyroid Function Tests: No results for input(s): TSH, T4TOTAL, FREET4, T3FREE, THYROIDAB in the last 72 hours. Anemia Panel: No results for input(s): VITAMINB12, FOLATE, FERRITIN, TIBC, IRON, RETICCTPCT in the last 72 hours. Sepsis Labs: Recent Labs  Lab 06/25/21 1749  LATICACIDVEN 1.3    No results found for this or any previous visit (from the past 240 hour(s)).       Radiology Studies: CT Angio Chest PE W/Cm &/Or Wo Cm  Result Date: 06/25/2021 CLINICAL DATA:  Shortness of breath and hypoxia. Atrial fibrillation. Lower abdominal pain. Dark urine. High probability for pulmonary embolism. EXAM: CT ANGIOGRAPHY CHEST CT ABDOMEN AND PELVIS WITH CONTRAST TECHNIQUE: Multidetector CT imaging of the chest was performed using the standard protocol during bolus administration of intravenous contrast. Multiplanar CT image reconstructions and MIPs were obtained to evaluate the vascular anatomy. Multidetector CT imaging of the abdomen and pelvis was performed using the standard protocol during bolus administration of intravenous contrast. RADIATION  DOSE REDUCTION: This exam was performed according to the departmental dose-optimization program which includes automated exposure control, adjustment of the mA and/or kV according to patient size and/or use of iterative reconstruction technique. CONTRAST:  146mL OMNIPAQUE IOHEXOL 350 MG/ML SOLN COMPARISON:  05/03/2019 FINDINGS: CTA CHEST FINDINGS Cardiovascular: Nonocclusive pulmonary embolism is seen in the segmental branches of the bilateral upper, right middle, and right lower lobar pulmonary arteries. No evidence of thoracic aortic aneurysm or dissection. Mediastinum/Nodes: No masses or pathologically enlarged lymph nodes identified. Lungs/Pleura: No pulmonary mass, infiltrate, or effusion. Mild atelectasis or scarring in both lower lungs. Mild centrilobular emphysema also noted. Musculoskeletal: No suspicious bone lesions identified. Review of the MIP images confirms the above findings. CT ABDOMEN and PELVIS FINDINGS Hepatobiliary: No hepatic masses identified. Gallbladder is unremarkable. No evidence of biliary ductal dilatation. Pancreas:  No mass or inflammatory changes. Spleen: Within normal limits in size and appearance. Adrenals/Urinary Tract: No masses identified. Tiny punctate calculus noted in lower pole of right kidney. No evidence of ureteral calculi or  hydronephrosis. Stomach/Bowel: Mild sigmoid diverticulitis is demonstrated. No evidence of abscess or bowel perforation. Vascular/Lymphatic: No pathologically enlarged lymph nodes. No acute vascular findings. Aortic atherosclerotic calcification incidentally noted. Reproductive:  No mass or other significant abnormality. Other:  None. Musculoskeletal:  No suspicious bone lesions identified. Review of the MIP images confirms the above findings. IMPRESSION: Nonocclusive pulmonary embolism in bilateral segmental pulmonary artery branches. Mild sigmoid diverticulitis. No evidence of abscess or bowel perforation. Tiny nonobstructing right renal calculus.  Aortic Atherosclerosis (ICD10-I70.0) and Emphysema (ICD10-J43.9). Critical Value/emergent results were called by telephone at the time of interpretation on 06/25/2021 at 4:55 pm to provider [facility] , who verbally acknowledged these results. Electronically Signed   By: Marlaine Hind M.D.   On: 06/25/2021 16:56   CT Abdomen Pelvis W Contrast  Result Date: 06/25/2021 CLINICAL DATA:  Shortness of breath and hypoxia. Atrial fibrillation. Lower abdominal pain. Dark urine. High probability for pulmonary embolism. EXAM: CT ANGIOGRAPHY CHEST CT ABDOMEN AND PELVIS WITH CONTRAST TECHNIQUE: Multidetector CT imaging of the chest was performed using the standard protocol during bolus administration of intravenous contrast. Multiplanar CT image reconstructions and MIPs were obtained to evaluate the vascular anatomy. Multidetector CT imaging of the abdomen and pelvis was performed using the standard protocol during bolus administration of intravenous contrast. RADIATION DOSE REDUCTION: This exam was performed according to the departmental dose-optimization program which includes automated exposure control, adjustment of the mA and/or kV according to patient size and/or use of iterative reconstruction technique. CONTRAST:  166mL OMNIPAQUE IOHEXOL 350 MG/ML SOLN COMPARISON:  05/03/2019 FINDINGS: CTA CHEST FINDINGS Cardiovascular: Nonocclusive pulmonary embolism is seen in the segmental branches of the bilateral upper, right middle, and right lower lobar pulmonary arteries. No evidence of thoracic aortic aneurysm or dissection. Mediastinum/Nodes: No masses or pathologically enlarged lymph nodes identified. Lungs/Pleura: No pulmonary mass, infiltrate, or effusion. Mild atelectasis or scarring in both lower lungs. Mild centrilobular emphysema also noted. Musculoskeletal: No suspicious bone lesions identified. Review of the MIP images confirms the above findings. CT ABDOMEN and PELVIS FINDINGS Hepatobiliary: No hepatic masses  identified. Gallbladder is unremarkable. No evidence of biliary ductal dilatation. Pancreas:  No mass or inflammatory changes. Spleen: Within normal limits in size and appearance. Adrenals/Urinary Tract: No masses identified. Tiny punctate calculus noted in lower pole of right kidney. No evidence of ureteral calculi or hydronephrosis. Stomach/Bowel: Mild sigmoid diverticulitis is demonstrated. No evidence of abscess or bowel perforation. Vascular/Lymphatic: No pathologically enlarged lymph nodes. No acute vascular findings. Aortic atherosclerotic calcification incidentally noted. Reproductive:  No mass or other significant abnormality. Other:  None. Musculoskeletal:  No suspicious bone lesions identified. Review of the MIP images confirms the above findings. IMPRESSION: Nonocclusive pulmonary embolism in bilateral segmental pulmonary artery branches. Mild sigmoid diverticulitis. No evidence of abscess or bowel perforation. Tiny nonobstructing right renal calculus. Aortic Atherosclerosis (ICD10-I70.0) and Emphysema (ICD10-J43.9). Critical Value/emergent results were called by telephone at the time of interpretation on 06/25/2021 at 4:55 pm to provider [facility] , who verbally acknowledged these results. Electronically Signed   By: Marlaine Hind M.D.   On: 06/25/2021 16:56   DG Chest Port 1 View  Result Date: 06/25/2021 CLINICAL DATA:  Shortness of breath. History of atrial fibrillation. EXAM: PORTABLE CHEST 1 VIEW COMPARISON:  12/12/2019 FINDINGS: The cardiomediastinal silhouette is unremarkable. Mild bibasilar atelectasis/scarring is noted, increased on the RIGHT. No airspace disease, pleural effusion or pneumothorax noted. No acute bony abnormalities are present. IMPRESSION: Mild bibasilar atelectasis/scarring, increased on the RIGHT. Electronically Signed   By:  Harmon Pier M.D.   On: 06/25/2021 15:30   ECHOCARDIOGRAM COMPLETE  Result Date: 06/26/2021    ECHOCARDIOGRAM REPORT   Patient Name:   [name] [name] Date of Exam: 06/26/2021 Medical Rec #:  [id number]         Height:       70.0 in Accession #:    [id number]        Weight:       250.0 lb Date of Birth:  05/10/1960          BSA:          2.295 m Patient Age:    61 years          BP:           130/67 mmHg Patient Gender: M                 HR:           105 bpm. Exam Location:  Inpatient Procedure: 2D Echo, Color Doppler, Cardiac Doppler and Strain Analysis Indications:    Pulmonary embolism  History:        Patient has prior history of Echocardiogram examinations, most                 recent 05/30/2019. CHF, COPD; Arrythmias:Atrial Fibrillation.  Sonographer:    Rodrigo Ran RCS Referring Phys: [id number] TIMOTHY S OPYD  Sonographer Comments: Patient is morbidly obese. Global longitudinal strain was attempted. IMPRESSIONS  1. Left ventricular ejection fraction, by estimation, is 60 to 65%. The left ventricle has normal function. The left ventricle has no regional wall motion abnormalities. There is mild left ventricular hypertrophy. Left ventricular diastolic parameters were normal.  2. Right ventricular systolic function is mildly reduced. The right ventricular size is mildly enlarged. There is moderately elevated pulmonary artery systolic pressure. The estimated right ventricular systolic pressure is 46.7 mmHg. RV free wall hypokinesis with normal function at apex, consistent with McConnell's sign as can be seen in acute PE  3. Right atrial size was mildly dilated.  4. The mitral valve is normal in structure. No evidence of mitral valve regurgitation.  5. The aortic valve is tricuspid. Aortic valve regurgitation is not visualized. No aortic stenosis is present.  6. The inferior vena cava is dilated in size with >50% respiratory variability, suggesting right atrial pressure of 8 mmHg. FINDINGS  Left Ventricle: Left ventricular ejection fraction, by estimation, is 60 to 65%. The left ventricle has normal function. The left ventricle has no regional wall motion  abnormalities. The left ventricular internal cavity size was normal in size. There is  mild left ventricular hypertrophy. Left ventricular diastolic parameters were normal. Right Ventricle: The right ventricular size is mildly enlarged. Right vetricular wall thickness was not well visualized. Right ventricular systolic function is mildly reduced. There is moderately elevated pulmonary artery systolic pressure. The tricuspid  regurgitant velocity is 3.11 m/s, and with an assumed right atrial pressure of 8 mmHg, the estimated right ventricular systolic pressure is 46.7 mmHg. Left Atrium: Left atrial size was normal in size. Right Atrium: Right atrial size was mildly dilated. Pericardium: Trivial pericardial effusion is present. Mitral Valve: The mitral valve is normal in structure. No evidence of mitral valve regurgitation. Tricuspid Valve: The tricuspid valve is normal in structure. Tricuspid valve regurgitation is trivial. Aortic Valve: The aortic valve is tricuspid. Aortic valve regurgitation is not visualized. No aortic stenosis is present. Aortic valve mean gradient measures 4.0 mmHg. Aortic valve peak gradient  measures 6.8 mmHg. Aortic valve area, by VTI measures 3.01 cm. Pulmonic Valve: The pulmonic valve was not well visualized. Pulmonic valve regurgitation is not visualized. Aorta: The aortic root and ascending aorta are structurally normal, with no evidence of dilitation. Venous: The inferior vena cava is dilated in size with greater than 50% respiratory variability, suggesting right atrial pressure of 8 mmHg. IAS/Shunts: The interatrial septum was not well visualized.  LEFT VENTRICLE PLAX 2D LVIDd:         4.80 cm   Diastology LVIDs:         2.60 cm   LV e' medial:    8.38 cm/s LV PW:         1.00 cm   LV E/e' medial:  10.7 LV IVS:        1.10 cm   LV e' lateral:   10.60 cm/s LVOT diam:     2.10 cm   LV E/e' lateral: 8.5 LV SV:         72 LV SV Index:   31 LVOT Area:     3.46 cm  RIGHT VENTRICLE              IVC RV Basal diam:  4.10 cm     IVC diam: 2.20 cm RV Mid diam:    3.80 cm RV S prime:     13.30 cm/s TAPSE (M-mode): 2.0 cm LEFT ATRIUM             Index        RIGHT ATRIUM           Index LA diam:        4.10 cm 1.79 cm/m   RA Area:     23.10 cm LA Vol (A2C):   40.9 ml 17.82 ml/m  RA Volume:   73.50 ml  32.03 ml/m LA Vol (A4C):   41.3 ml 18.00 ml/m LA Biplane Vol: 41.2 ml 17.95 ml/m  AORTIC VALVE                    PULMONIC VALVE AV Area (Vmax):    3.12 cm     PV Vmax:       0.86 m/s AV Area (Vmean):   3.13 cm     PV Peak grad:  2.9 mmHg AV Area (VTI):     3.01 cm AV Vmax:           130.00 cm/s AV Vmean:          89.600 cm/s AV VTI:            0.238 m AV Peak Grad:      6.8 mmHg AV Mean Grad:      4.0 mmHg LVOT Vmax:         117.00 cm/s LVOT Vmean:        81.000 cm/s LVOT VTI:          0.207 m LVOT/AV VTI ratio: 0.87  AORTA Ao Root diam: 3.50 cm Ao Asc diam:  3.00 cm MITRAL VALVE               TRICUSPID VALVE MV Area (PHT): 5.13 cm    TR Peak grad:   38.7 mmHg MV Decel Time: 148 msec    TR Vmax:        311.00 cm/s MV E velocity: 90.00 cm/s MV A velocity: 87.80 cm/s  SHUNTS MV E/A ratio:  1.03        Systemic VTI:  0.21 m                            Systemic Diam: 2.10 cm Oswaldo Milian MD Electronically signed by Oswaldo Milian MD Signature Date/Time: 06/26/2021/1:07:44 PM    Final    VAS [place] LOWER EXTREMITY VENOUS (DVT)  Result Date: 06/26/2021  Lower Venous DVT Study Patient Name:  [name] [name]  Date of Exam:   06/26/2021 Medical Rec #: [id number]          Accession #:    [id number] Date of Birth: Jun 16, 1960           Patient Gender: M Patient Age:   41 years Exam Location:  [facility] Procedure:      VAS [place] LOWER EXTREMITY VENOUS (DVT) Referring Phys: Christia Reading OPYD --------------------------------------------------------------------------------  Indications: Pulmonary embolism.  Risk Factors: Confirmed PE. Anticoagulation: Heparin. Limitations: Poor ultrasound/tissue interface  and body habitus. Comparison Study: No prior studies. Performing Technologist: Oliver Hum RVT  Examination Guidelines: A complete evaluation includes B-mode imaging, spectral Doppler, color Doppler, and power Doppler as needed of all accessible portions of each vessel. Bilateral testing is considered an integral part of a complete examination. Limited examinations for reoccurring indications may be performed as noted. The reflux portion of the exam is performed with the patient in reverse Trendelenburg.  +---------+---------------+---------+-----------+----------+-------------------+ RIGHT    CompressibilityPhasicitySpontaneityPropertiesThrombus Aging      +---------+---------------+---------+-----------+----------+-------------------+ CFV      Full           Yes      Yes                                      +---------+---------------+---------+-----------+----------+-------------------+ SFJ      Full                                                             +---------+---------------+---------+-----------+----------+-------------------+ FV Prox  Full                                                             +---------+---------------+---------+-----------+----------+-------------------+ FV Mid   Full                                                             +---------+---------------+---------+-----------+----------+-------------------+ FV DistalFull                                                             +---------+---------------+---------+-----------+----------+-------------------+ PFV      Full                                                             +---------+---------------+---------+-----------+----------+-------------------+  POP      Full           Yes      Yes                                      +---------+---------------+---------+-----------+----------+-------------------+ PTV      Full                                                              +---------+---------------+---------+-----------+----------+-------------------+ PERO                                                  Not well visualized +---------+---------------+---------+-----------+----------+-------------------+   +---------+---------------+---------+-----------+----------+-------------------+ LEFT     CompressibilityPhasicitySpontaneityPropertiesThrombus Aging      +---------+---------------+---------+-----------+----------+-------------------+ CFV      Partial        Yes      Yes                  Acute               +---------+---------------+---------+-----------+----------+-------------------+ SFJ      Full                                                             +---------+---------------+---------+-----------+----------+-------------------+ FV Prox  None           No       No                   Acute               +---------+---------------+---------+-----------+----------+-------------------+ FV Mid   Full                                                             +---------+---------------+---------+-----------+----------+-------------------+ FV DistalFull                                                             +---------+---------------+---------+-----------+----------+-------------------+ PFV      Full                                                             +---------+---------------+---------+-----------+----------+-------------------+ POP      Full           Yes      Yes                                      +---------+---------------+---------+-----------+----------+-------------------+  PTV      Full                                                             +---------+---------------+---------+-----------+----------+-------------------+ PERO                                                  Not well visualized  +---------+---------------+---------+-----------+----------+-------------------+    Summary: RIGHT: - There is no evidence of deep vein thrombosis in the lower extremity. However, portions of this examination were limited- see technologist comments above.  - No cystic structure found in the popliteal fossa.  LEFT: - Findings consistent with acute deep vein thrombosis involving the left common femoral vein, and left femoral vein. - No cystic structure found in the popliteal fossa.  *See table(s) above for measurements and observations.    Preliminary         Scheduled Meds:  dofetilide  500 mcg Oral BID   folic acid  1 mg Oral Daily   LORazepam  0-4 mg Oral Q6H   Followed by   Derrill Memo ON 06/28/2021] LORazepam  0-4 mg Oral Q12H   metoprolol succinate  100 mg Oral Daily   multivitamin with minerals  1 tablet Oral Daily   pantoprazole  40 mg Oral Daily   sodium chloride flush  3 mL Intravenous Q12H   thiamine  100 mg Oral Daily   Or   thiamine  100 mg Intravenous Daily   Continuous Infusions:  cefTRIAXone (ROCEPHIN)  IV Stopped (06/26/21 1838)   heparin 2,150 Units/hr (06/27/21 0700)   metronidazole Stopped (06/27/21 0543)     LOS: 2 days   Time spent= 35 mins    Caralyn Twining Arsenio Loader, MD [facility]  If 7PM-7AM, please contact night-coverage  06/27/2021, 7:51 AM

## 2021-06-27 NOTE — Discharge Instructions (Signed)
Information on my medicine - ELIQUIS (apixaban)  This medication education was reviewed with me or my healthcare representative as part of my discharge preparation.    Why was Eliquis prescribed for you? Eliquis was prescribed to treat blood clots that may have been found in the veins of your legs (deep vein thrombosis) or in your lungs (pulmonary embolism) and to reduce the risk of them occurring again.  What do You need to know about Eliquis ? The starting dose is 10 mg (two 5 mg tablets) taken TWICE daily for the FIRST SEVEN (7) DAYS, then on 07/04/21  the dose is reduced to ONE 5 mg tablet taken TWICE daily.  Eliquis may be taken with or without food.   Try to take the dose about the same time in the morning and in the evening. If you have difficulty swallowing the tablet whole please discuss with your pharmacist how to take the medication safely.  Take Eliquis exactly as prescribed and DO NOT stop taking Eliquis without talking to the doctor who prescribed the medication.  Stopping may increase your risk of developing a new blood clot.  Refill your prescription before you run out.  After discharge, you should have regular check-up appointments with your healthcare provider that is prescribing your Eliquis.    What do you do if you miss a dose? If a dose of ELIQUIS is not taken at the scheduled time, take it as soon as possible on the same day and twice-daily administration should be resumed. The dose should not be doubled to make up for a missed dose.  Important Safety Information A possible side effect of Eliquis is bleeding. You should call your healthcare provider right away if you experience any of the following: Bleeding from an injury or your nose that does not stop. Unusual colored urine (red or dark brown) or unusual colored stools (red or black). Unusual bruising for unknown reasons. A serious fall or if you hit your head (even if there is no bleeding).  Some  medicines may interact with Eliquis and might increase your risk of bleeding or clotting while on Eliquis. To help avoid this, consult your healthcare provider or pharmacist prior to using any new prescription or non-prescription medications, including herbals, vitamins, non-steroidal anti-inflammatory drugs (NSAIDs) and supplements.  This website has more information on Eliquis (apixaban): http://www.eliquis.com/eliquis/home

## 2021-06-27 NOTE — TOC Initial Note (Signed)
Transition of Care [facility]) - Initial/Assessment Note    Patient Details  Name: [name] [name] MRN: [id number] Date of Birth: 08/19/1960  Transition of Care [facility]) CM/SW Contact:    Golda Acre, RN Phone Number: 06/27/2021, 9:57 AM  Clinical Narrative:                  Transition of Care [facility]) Screening Note   Patient Details  Name: [name] [name] Date of Birth: [birth year]   Transition of Care [facility]) CM/SW Contact:    Golda Acre, RN Phone Number: 06/27/2021, 9:57 AM    Transition of Care Department [facility]) has reviewed patient and no TOC needs have been identified at this time. We will continue to monitor patient advancement through interdisciplinary progression rounds. If new patient transition needs arise, please place a TOC consult.    Expected Discharge Plan: Home/Self Care Barriers to Discharge: No Barriers Identified   Patient Goals and CMS Choice Patient states their goals for this hospitalization and ongoing recovery are:: to go home CMS Medicare.gov Compare Post Acute Care list provided to:: Patient    Expected Discharge Plan and Services Expected Discharge Plan: Home/Self Care   Discharge Planning Services: CM Consult   Living arrangements for the past 2 months: Single Family Home                                      Prior Living Arrangements/Services Living arrangements for the past 2 months: Single Family Home Lives with:: Self Patient language and need for interpreter reviewed:: Yes Do you feel safe going back to the place where you live?: Yes            Criminal Activity/Legal Involvement Pertinent to Current Situation/Hospitalization: No - Comment as needed  Activities of Daily Living Home Assistive Devices/Equipment: None ADL Screening (condition at time of admission) Patient's cognitive ability adequate to safely complete daily activities?: Yes Is the patient deaf or have difficulty hearing?: No Does the patient have  difficulty seeing, even when wearing glasses/contacts?: No Does the patient have difficulty concentrating, remembering, or making decisions?: No Patient able to express need for assistance with ADLs?: Yes Does the patient have difficulty dressing or bathing?: No Independently performs ADLs?: Yes (appropriate for developmental age) Does the patient have difficulty walking or climbing stairs?: No Weakness of Legs: None Weakness of Arms/Hands: None  Permission Sought/Granted                  Emotional Assessment Appearance:: Appears stated age     Orientation: : Oriented to Self, Oriented to Place, Oriented to  Time, Oriented to Situation Alcohol / Substance Use: Alcohol Use, Illicit Drugs Psych Involvement: No (comment)  Admission diagnosis:  Diverticulitis [K57.92] PE (pulmonary thromboembolism) (HCC) [I26.99] Hypoxia [R09.02] Multiple subsegmental pulmonary emboli without acute cor pulmonale (HCC) [I26.94] Patient Active Problem List   Diagnosis Date Noted   PE (pulmonary thromboembolism) (HCC) 06/25/2021   Sigmoid diverticulitis 06/25/2021   CKD (chronic kidney disease), stage II 06/25/2021   OSA (obstructive sleep apnea)    Angina pectoris (HCC) 02/22/2018   Shortness of breath 02/22/2018   Hypertensive heart disease without CHF 03/22/2017   Persistent atrial fibrillation (HCC) 12/15/2016   Obesity (BMI 30-39.9) 12/15/2016   PCP:  Kaleen Mask, MD Pharmacy:   [facility] - [place], [place] - [address] [place]  [place] Phone: [phone] Fax: [phone]  [id number] - [place], [place] [address] Phone: [phone] Fax: [phone]  [facility] [id number] - [place], [place] - [address] [place] [address] [place] [address] Phone: [phone] Fax: [phone]  [facility] [id number]  - [place], [place] - [address] AT [place] [address] [place] [address] Phone: [phone] Fax: [phone]     Social Determinants of Health (SDOH) Interventions    Readmission Risk Interventions     View : No data to display.

## 2021-06-27 NOTE — Progress Notes (Signed)
ANTICOAGULATION CONSULT NOTE  Pharmacy Consult for Heparin Indication: pulmonary embolus  No Known Allergies  Patient Measurements: Height: 5\' 10"  (177.8 cm) Weight: 113.4 kg (250 lb) IBW/kg (Calculated) : 73 Heparin Dosing Weight: 97.9 kg  Vital Signs: Temp: 98.3 F (36.8 C) (05/21 2030) Temp Source: Oral (05/21 2030) BP: 132/78 (05/21 2030) Pulse Rate: 79 (05/21 2030)  Labs: Recent Labs    06/25/21 1432 06/25/21 1649 06/25/21 2323 06/26/21 0531 06/26/21 0701 06/26/21 1652 06/27/21 0352  HGB 17.4*  --   --  16.4  --   --  15.4  HCT 51.7  --   --  52.8*  --   --  47.8  PLT 198  --   --  189  --   --  188  APTT  --  29  --   --   --   --   --   LABPROT  --  14.0  --   --   --   --   --   INR  --  1.1  --   --   --   --   --   HEPARINUNFRC  --   --    < > 0.41 0.30 0.64 0.51  CREATININE 1.40*  --   --  1.16  --   --  1.02  TROPONINIHS 3 3  --   --   --   --   --    < > = values in this interval not displayed.     Estimated Creatinine Clearance: 98.4 mL/min (by C-G formula based on SCr of 1.02 mg/dL).   Medications:  Medications Prior to Admission  Medication Sig Dispense Refill Last Dose   albuterol (VENTOLIN HFA) 108 (90 Base) MCG/ACT inhaler Inhale 2 puffs into the lungs every 6 (six) hours as needed for wheezing or shortness of breath.   06/25/2021   dofetilide (TIKOSYN) 500 MCG capsule TAKE 1 CAPSULE BY MOUTH 2 TIMES DAILY. 180 capsule 3 06/25/2021   metoprolol succinate (TOPROL-XL) 100 MG 24 hr tablet TAKE 1 TABLET BY MOUTH DAILY. 90 tablet 3 06/24/2021 at 2100   magnesium oxide (MAG-OX) 400 (241.3 Mg) MG tablet Take 1 tablet (400 mg total) by mouth daily. (Patient not taking: Reported on 06/25/2021) 30 tablet 6 Completed Course   ondansetron (ZOFRAN ODT) 4 MG disintegrating tablet Take 1 tablet (4 mg total) by mouth every 8 (eight) hours as needed for nausea or vomiting. (Patient not taking: Reported on 06/25/2021) 20 tablet 0 Completed Course    oxyCODONE-acetaminophen (PERCOCET/ROXICET) 5-325 MG tablet Take 1 tablet by mouth every 6 (six) hours as needed for severe pain. (Patient not taking: Reported on 06/25/2021) 16 tablet 0 Completed Course   torsemide (DEMADEX) 20 MG tablet Take 1 tablet (20 mg total) by mouth 2 (two) times daily. (Patient not taking: Reported on 09/06/2020) 30 tablet 3 Completed Course    Scheduled:   dofetilide  500 mcg Oral BID   folic acid  1 mg Oral Daily   LORazepam  0-4 mg Oral Q6H   Followed by   Melene Muller ON 06/28/2021] LORazepam  0-4 mg Oral Q12H   metoprolol succinate  100 mg Oral Daily   multivitamin with minerals  1 tablet Oral Daily   pantoprazole  40 mg Oral Daily   sodium chloride flush  3 mL Intravenous Q12H   thiamine  100 mg Oral Daily   Or   thiamine  100 mg Intravenous Daily   Infusions:  cefTRIAXone (ROCEPHIN)  IV Stopped (06/26/21 1838)   heparin 2,150 Units/hr (06/27/21 0400)   metronidazole Stopped (06/26/21 1743)   PRN:   Assessment: 19 yom with a history of COPD, AFib not on anticoagulation d/t CHADS-VASc of 0, OSA, HF. Patient is presenting with abd pain and discomfort. Heparin per pharmacy consult placed for pulmonary embolus.  CTA PE with Nonocclusive pulmonary embolism in bilateral segmental pulmonary artery branches  06/27/2021 HL 0.51 therapeutic on 2150 units/hr CBC: Hgb Plt remain stable WNL Mild AKI on admission, now resolving (baseline ~1.0) No bleeding or infusion issues noted  Goal of Therapy:  Heparin level 0.3-0.7 units/ml Monitor platelets by anticoagulation protocol: Yes   Plan:  Continue heparin drip at 2150 units/hr Daily CBC and heparin level Continue to monitor H&H and platelets  Dolly Rias RPh 06/27/2021, 4:26 AM

## 2021-06-27 NOTE — Progress Notes (Signed)
Patient ID: [name] [name], male   DOB: [birth year], 61 y.o.   MRN: [id number] Request received for possible PE thrombectomy on pt. Case and imaging studies were reviewed by Dr. Denna Haggard. Patient with history of atrial fibrillation, HTN, COPD alcohol use who was recently admitted with abdominal pain and dyspnea.  Subsequent work-up revealed PE, left lower extremity DVT to the common femoral vein and diverticulitis.  Troponins were negative and there was no heart strain on CTA and echo showed nl EF, reduced RV function,  some increased pulmonary artery pressures.  Patient's vital signs currently stable, O2 sat 96%. On IV heparin.  He states his breathing has improved since admission. PE by imaging is  small clot burden and per Dr. Ky Barban Abd there is no role for catheter directed therapy.  Recommend just systemic anticoagulation for PE and DVT.  Patient is minimally symptomatic from DVT and there is no iliac vein involvement by CT with no strong benefit for catheter directed care as well.  Above was discussed with primary care team by Dr. Denna Haggard. Please contact Dr. Denna Haggard at [phone] or pager [phone] with any additional questions.

## 2021-06-28 DIAGNOSIS — I2699 Other pulmonary embolism without acute cor pulmonale: Secondary | ICD-10-CM | POA: Diagnosis not present

## 2021-06-28 LAB — BASIC METABOLIC PANEL
Anion gap: 7 (ref 5–15)
BUN: 12 mg/dL (ref 6–20)
CO2: 33 mmol/L — ABNORMAL HIGH (ref 22–32)
Calcium: 8.8 mg/dL — ABNORMAL LOW (ref 8.9–10.3)
Chloride: 97 mmol/L — ABNORMAL LOW (ref 98–111)
Creatinine, Ser: 1.04 mg/dL (ref 0.61–1.24)
GFR, Estimated: >60 mL/min (ref >60)
Glucose, Bld: 125 mg/dL — ABNORMAL HIGH (ref 70–99)
Potassium: 3.8 mmol/L (ref 3.5–5.1)
Sodium: 137 mmol/L (ref 135–145)

## 2021-06-28 LAB — CBC
HCT: 45.8 % (ref 39.0–52.0)
Hemoglobin: 15.4 g/dL (ref 13.0–17.0)
MCH: 32.3 pg (ref 26.0–34.0)
MCHC: 33.6 g/dL (ref 30.0–36.0)
MCV: 96 fL (ref 80.0–100.0)
Platelets: 182 10*3/uL (ref 150–400)
RBC: 4.77 MIL/uL (ref 4.22–5.81)
RDW: 14.3 % (ref 11.5–15.5)
WBC: 6.6 10*3/uL (ref 4.0–10.5)
nRBC: 0 % (ref 0.0–0.2)

## 2021-06-28 LAB — GLUCOSE, CAPILLARY: Glucose-Capillary: 93 mg/dL (ref 70–99)

## 2021-06-28 MED ORDER — LORATADINE 10 MG PO TABS
10.0000 mg | ORAL_TABLET | Freq: Every day | ORAL | Status: DC
  Administered 2021-06-28 – 2021-06-30 (×3): 10 mg via ORAL
  Filled 2021-06-28 (×3): qty 1

## 2021-06-28 MED ORDER — AMOXICILLIN-POT CLAVULANATE 875-125 MG PO TABS
1.0000 | ORAL_TABLET | Freq: Two times a day (BID) | ORAL | Status: DC
  Administered 2021-06-28 – 2021-06-30 (×5): 1 via ORAL
  Filled 2021-06-28 (×5): qty 1

## 2021-06-28 MED ORDER — FUROSEMIDE 10 MG/ML IJ SOLN
40.0000 mg | Freq: Once | INTRAMUSCULAR | Status: AC
  Administered 2021-06-28: 40 mg via INTRAVENOUS
  Filled 2021-06-28: qty 4

## 2021-06-28 MED ORDER — POTASSIUM CHLORIDE CRYS ER 20 MEQ PO TBCR
20.0000 meq | EXTENDED_RELEASE_TABLET | Freq: Once | ORAL | Status: AC
Start: 2021-06-28 — End: 2021-06-28
  Administered 2021-06-28: 20 meq via ORAL
  Filled 2021-06-28: qty 1

## 2021-06-28 NOTE — Progress Notes (Signed)
Patient ambulated approximately 360 feet in hallway while RN accompanied him. Patient oxygen saturations remained below 90% on less than 8L of oxygen.  Patient toleration was better than previous ambulation.  RN will continue to monitor oxygen levels and continue encouraging patient to practice deep breathing and use incentive spirometer several times every hour.   Patient agreed to wear Cpap tonight.  RT will bring this to his room.

## 2021-06-28 NOTE — Progress Notes (Signed)
PROGRESS NOTE    [name] [name]  [id number] DOB: 1960-12-29 DOA: 06/25/2021 PCP: Leonard Downing, MD   Brief Narrative:  61 year old with history of atrial fibrillation not on anticoagulation, obesity, COPD, alcohol use came to the ED with complaints of abdominal pain and shortness of breath.  The symptoms have been ongoing for the past 2 days and outpatient there is suspicion for bladder infection therefore was started on Bactrim.  In ED CTA chest showed bilateral pulmonary embolism, CT of the abdomen pelvis showed mild uncomplicated diverticulitis and nonobstructive renal stone.  He lives a quite sedentary lifestyle at home.  Echocardiogram was suggestive of normal EF but reduced RV function, elevated PASP, RV hypokinesia.  Lower extremity Dopplers was negative for left lower extremity DVT.  IR consulted - no need for thrombectomy.    Assessment & Plan:  Principal Problem:   PE (pulmonary thromboembolism) ([place]) Active Problems:   Persistent atrial fibrillation (HCC)   Sigmoid diverticulitis   CKD (chronic kidney disease), stage II   OSA (obstructive sleep apnea)     Pulmonary embolism; acute hypoxic respiratory failure without evidence of cor pulmonale Nonocclusive bilateral pulmonary embolism seen on the CTA.  Transition to oral Eliquis  Echocardiogram was suggestive of normal EF but reduced RV function, elevated PASP, RV hypokinesia.  Lower extremity Dopplers was negative for left lower extremity DVT.  Spoke with IR Dr Denna Haggard, appreciate his input, no role for catheter guided thrombectomy at this point.  Continue bronchodilators   Sigmoid diverticulitis, uncomplicated Severe Intermittent Abd  pain - Tolerating orals slowly.  Gentle fluids.  IV Rocephin and Flagyl.  Will transition to oral Augmentin to complete 7-day course Lipase is normal.    Atrial fibrillation  CHADS-VASc of 0 therefore not on anticoagulation but now he will be due to pulmonary embolism.  Continue  his Tikosyn and Toprol-XL   Acute kidney injury on CKD stage II, resolved -Admission creatinine 1.4.  Baseline 1.1  Alcohol abuse  -Alcohol withdrawal protocol     DVT prophylaxis: Heparin drip Code Status: Full code Family Communication:    Status is: Inpatient Breathing status is slowly improving, still on 2 L nasal cannula.  Gets exertional dyspnea.  Hopefully we can discharge in next 24-48 hours.   Subjective: Patient still has exertional dyspnea.  Denies any chest pain.  Lower abdominal pain is better.  Examination: Constitutional: Not in acute distress, 2 L nasal cannula Respiratory: Diminished breath sounds bilaterally Cardiovascular: Normal sinus rhythm, no rubs Abdomen: Nontender nondistended good bowel sounds Musculoskeletal: No edema noted Skin: No rashes seen Neurologic: CN 2-12 grossly intact.  And nonfocal Psychiatric: Normal judgment and insight. Alert and oriented x 3. Normal mood.  Objective: Vitals:   06/27/21 2030 06/28/21 0202 06/28/21 0817 06/28/21 0826  BP: (!) 168/74 (!) 158/75  (!) 145/97  Pulse: 87 88  91  Resp: 20 20  12   Temp: 97.8 F (36.6 C) 98.2 F (36.8 C)  98.2 F (36.8 C)  TempSrc: Oral   Oral  SpO2: 92% 92% 98% 97%  Weight:      Height:        Intake/Output Summary (Last 24 hours) at 06/28/2021 1008 Last data filed at 06/28/2021 0839 Gross per 24 hour  Intake 306.3 ml  Output 1975 ml  Net -1668.7 ml   Filed Weights   06/25/21 1423  Weight: 113.4 kg     Data Reviewed:   CBC: Recent Labs  Lab 06/25/21 1432 06/26/21 0531 06/27/21 0352 06/28/21  0338  WBC 7.3 6.4 6.1 6.6  NEUTROABS 5.4  --   --   --   HGB 17.4* 16.4 15.4 15.4  HCT 51.7 52.8* 47.8 45.8  MCV 93.5 98.7 97.0 96.0  PLT 198 189 188 [id number]   Basic Metabolic Panel: Recent Labs  Lab 06/25/21 1432 06/26/21 0531 06/27/21 0352 06/28/21 0338  NA 135 137 135 137  K 4.1 4.9 5.0 3.8  CL 100 104 98 97*  CO2 29 26 30  33*  GLUCOSE 94 97 116* 125*  BUN 21* 18  13 12   CREATININE 1.40* 1.16 1.02 1.04  CALCIUM 8.7* 8.7* 8.6* 8.8*  MG  --   --  2.1  --    GFR: Estimated Creatinine Clearance: 96.5 mL/min (by C-G formula based on SCr of 1.04 mg/dL). Liver Function Tests: Recent Labs  Lab 06/25/21 1432 06/26/21 0531  AST 31 27  ALT 23 21  ALKPHOS 46 43  BILITOT 1.3* 0.6  PROT 7.8 7.8  ALBUMIN 4.0 3.9   Recent Labs  Lab 06/27/21 0828  LIPASE 37   No results for input(s): AMMONIA in the last 168 hours. Coagulation Profile: Recent Labs  Lab 06/25/21 1649  INR 1.1   Cardiac Enzymes: No results for input(s): CKTOTAL, CKMB, CKMBINDEX, TROPONINI in the last 168 hours. BNP (last 3 results) No results for input(s): PROBNP in the last 8760 hours. HbA1C: No results for input(s): HGBA1C in the last 72 hours. CBG: Recent Labs  Lab 06/28/21 0820  GLUCAP 93   Lipid Profile: No results for input(s): CHOL, HDL, LDLCALC, TRIG, CHOLHDL, LDLDIRECT in the last 72 hours. Thyroid Function Tests: No results for input(s): TSH, T4TOTAL, FREET4, T3FREE, THYROIDAB in the last 72 hours. Anemia Panel: No results for input(s): VITAMINB12, FOLATE, FERRITIN, TIBC, IRON, RETICCTPCT in the last 72 hours. Sepsis Labs: Recent Labs  Lab 06/25/21 1749  LATICACIDVEN 1.3    No results found for this or any previous visit (from the past 240 hour(s)).       Radiology Studies: ECHOCARDIOGRAM COMPLETE  Result Date: 06/26/2021    ECHOCARDIOGRAM REPORT   Patient Name:   [name] [name] Date of Exam: 06/26/2021 Medical Rec #:  [id number]         Height:       70.0 in Accession #:    [id number]        Weight:       250.0 lb Date of Birth:  1960/10/18          BSA:          2.295 m Patient Age:    49 years          BP:           130/67 mmHg Patient Gender: M                 HR:           105 bpm. Exam Location:  Inpatient Procedure: 2D Echo, Color Doppler, Cardiac Doppler and Strain Analysis Indications:    Pulmonary embolism  History:        Patient has prior  history of Echocardiogram examinations, most                 recent 05/30/2019. CHF, COPD; Arrythmias:Atrial Fibrillation.  Sonographer:    Joette Catching RCS Referring Phys: [id number] TIMOTHY S OPYD  Sonographer Comments: Patient is morbidly obese. Global longitudinal strain was attempted. IMPRESSIONS  1. Left ventricular ejection fraction, by estimation, is 60  to 65%. The left ventricle has normal function. The left ventricle has no regional wall motion abnormalities. There is mild left ventricular hypertrophy. Left ventricular diastolic parameters were normal.  2. Right ventricular systolic function is mildly reduced. The right ventricular size is mildly enlarged. There is moderately elevated pulmonary artery systolic pressure. The estimated right ventricular systolic pressure is 123456 mmHg. RV free wall hypokinesis with normal function at apex, consistent with McConnell's sign as can be seen in acute PE  3. Right atrial size was mildly dilated.  4. The mitral valve is normal in structure. No evidence of mitral valve regurgitation.  5. The aortic valve is tricuspid. Aortic valve regurgitation is not visualized. No aortic stenosis is present.  6. The inferior vena cava is dilated in size with >50% respiratory variability, suggesting right atrial pressure of 8 mmHg. FINDINGS  Left Ventricle: Left ventricular ejection fraction, by estimation, is 60 to 65%. The left ventricle has normal function. The left ventricle has no regional wall motion abnormalities. The left ventricular internal cavity size was normal in size. There is  mild left ventricular hypertrophy. Left ventricular diastolic parameters were normal. Right Ventricle: The right ventricular size is mildly enlarged. Right vetricular wall thickness was not well visualized. Right ventricular systolic function is mildly reduced. There is moderately elevated pulmonary artery systolic pressure. The tricuspid  regurgitant velocity is 3.11 m/s, and with an assumed  right atrial pressure of 8 mmHg, the estimated right ventricular systolic pressure is 123456 mmHg. Left Atrium: Left atrial size was normal in size. Right Atrium: Right atrial size was mildly dilated. Pericardium: Trivial pericardial effusion is present. Mitral Valve: The mitral valve is normal in structure. No evidence of mitral valve regurgitation. Tricuspid Valve: The tricuspid valve is normal in structure. Tricuspid valve regurgitation is trivial. Aortic Valve: The aortic valve is tricuspid. Aortic valve regurgitation is not visualized. No aortic stenosis is present. Aortic valve mean gradient measures 4.0 mmHg. Aortic valve peak gradient measures 6.8 mmHg. Aortic valve area, by VTI measures 3.01 cm. Pulmonic Valve: The pulmonic valve was not well visualized. Pulmonic valve regurgitation is not visualized. Aorta: The aortic root and ascending aorta are structurally normal, with no evidence of dilitation. Venous: The inferior vena cava is dilated in size with greater than 50% respiratory variability, suggesting right atrial pressure of 8 mmHg. IAS/Shunts: The interatrial septum was not well visualized.  LEFT VENTRICLE PLAX 2D LVIDd:         4.80 cm   Diastology LVIDs:         2.60 cm   LV e' medial:    8.38 cm/s LV PW:         1.00 cm   LV E/e' medial:  10.7 LV IVS:        1.10 cm   LV e' lateral:   10.60 cm/s LVOT diam:     2.10 cm   LV E/e' lateral: 8.5 LV SV:         72 LV SV Index:   31 LVOT Area:     3.46 cm  RIGHT VENTRICLE             IVC RV Basal diam:  4.10 cm     IVC diam: 2.20 cm RV Mid diam:    3.80 cm RV S prime:     13.30 cm/s TAPSE (M-mode): 2.0 cm LEFT ATRIUM             Index        RIGHT  ATRIUM           Index LA diam:        4.10 cm 1.79 cm/m   RA Area:     23.10 cm LA Vol (A2C):   40.9 ml 17.82 ml/m  RA Volume:   73.50 ml  32.03 ml/m LA Vol (A4C):   41.3 ml 18.00 ml/m LA Biplane Vol: 41.2 ml 17.95 ml/m  AORTIC VALVE                    PULMONIC VALVE AV Area (Vmax):    3.12 cm     PV  Vmax:       0.86 m/s AV Area (Vmean):   3.13 cm     PV Peak grad:  2.9 mmHg AV Area (VTI):     3.01 cm AV Vmax:           130.00 cm/s AV Vmean:          89.600 cm/s AV VTI:            0.238 m AV Peak Grad:      6.8 mmHg AV Mean Grad:      4.0 mmHg LVOT Vmax:         117.00 cm/s LVOT Vmean:        81.000 cm/s LVOT VTI:          0.207 m LVOT/AV VTI ratio: 0.87  AORTA Ao Root diam: 3.50 cm Ao Asc diam:  3.00 cm MITRAL VALVE               TRICUSPID VALVE MV Area (PHT): 5.13 cm    TR Peak grad:   38.7 mmHg MV Decel Time: 148 msec    TR Vmax:        311.00 cm/s MV E velocity: 90.00 cm/s MV A velocity: 87.80 cm/s  SHUNTS MV E/A ratio:  1.03        Systemic VTI:  0.21 m                            Systemic Diam: 2.10 cm Oswaldo Milian MD Electronically signed by Oswaldo Milian MD Signature Date/Time: 06/26/2021/1:07:44 PM    Final         Scheduled Meds:  apixaban  10 mg Oral BID   Followed by   Derrill Memo ON 07/04/2021] apixaban  5 mg Oral BID   arformoterol  15 mcg Nebulization BID   dofetilide  500 mcg Oral BID   folic acid  1 mg Oral Daily   LORazepam  0-4 mg Oral Q12H   metoprolol succinate  100 mg Oral Daily   multivitamin with minerals  1 tablet Oral Daily   pantoprazole  40 mg Oral Daily   revefenacin  175 mcg Nebulization Daily   sodium chloride flush  3 mL Intravenous Q12H   thiamine  100 mg Oral Daily   Or   thiamine  100 mg Intravenous Daily   Continuous Infusions:  cefTRIAXone (ROCEPHIN)  IV 2 g (06/27/21 1749)   metronidazole 500 mg (06/28/21 0429)     LOS: 3 days   Time spent= 35 mins    Shetara Launer Arsenio Loader, MD [facility]  If 7PM-7AM, please contact night-coverage  06/28/2021, 10:08 AM

## 2021-06-28 NOTE — Progress Notes (Signed)
Patient ambulated approximately 330 feet in hallway while RN accompanied him. Patient oxygen saturation was 84% on RA.[name] RN encouraged patient to take deep breaths while continuing to monitor and titrate oxygen. Patient required 8L of oxygen to reach 90% saturation.  After returning to patient room, patient sat down while RN continued monitoring oxygen saturation. Patient maintained 92-93% oxygen saturation on 5L of oxygen.  RN will continue to monitor oxygen levels and encourage patient to practice deep breathing and use incentive spirometer several times every hour.

## 2021-06-29 DIAGNOSIS — I2699 Other pulmonary embolism without acute cor pulmonale: Secondary | ICD-10-CM | POA: Diagnosis not present

## 2021-06-29 MED ORDER — FUROSEMIDE 10 MG/ML IJ SOLN
40.0000 mg | Freq: Once | INTRAMUSCULAR | Status: AC
  Administered 2021-06-29: 40 mg via INTRAVENOUS
  Filled 2021-06-29: qty 4

## 2021-06-29 MED ORDER — PREDNISONE 20 MG PO TABS
40.0000 mg | ORAL_TABLET | Freq: Every day | ORAL | Status: DC
  Administered 2021-06-29 – 2021-06-30 (×2): 40 mg via ORAL
  Filled 2021-06-29 (×2): qty 2

## 2021-06-29 NOTE — Progress Notes (Signed)
NT assisted patient in ambulating in hallway for approximately 800 feet. Patient tolerated this well. He required 3L of oxygen to maintain saturation level of 90%.  RN will continue to monitor and reinforce education.

## 2021-06-29 NOTE — Progress Notes (Signed)
PROGRESS NOTE    [name] [name]  QMV:[id number] DOB: 03/01/1960 DOA: 06/25/2021 PCP: Kaleen MaskElkins, Wilson Oliver, MD   Brief Narrative:  61 year old with history of atrial fibrillation not on anticoagulation, obesity, COPD, alcohol use came to the ED with complaints of abdominal pain and shortness of breath.  The symptoms have been ongoing for the past 2 days and outpatient there is suspicion for bladder infection therefore was started on Bactrim.  In ED CTA chest showed bilateral pulmonary embolism, CT of the abdomen pelvis showed mild uncomplicated diverticulitis and nonobstructive renal stone.  He lives a quite sedentary lifestyle at home.  Echocardiogram was suggestive of normal EF but reduced RV function, elevated PASP, RV hypokinesia.  Lower extremity Dopplers was negative for left lower extremity DVT.  IR consulted - no need for thrombectomy.  Hospital course complicated by hypoxia likely from reactive airway disease and some fluid overload requiring bronchodilators, steroids and IV Lasix.   Assessment & Plan:  Principal Problem:   PE (pulmonary thromboembolism) (HCC) Active Problems:   Persistent atrial fibrillation (HCC)   Sigmoid diverticulitis   CKD (chronic kidney disease), stage II   OSA (obstructive sleep apnea)     Pulmonary embolism; acute hypoxic respiratory failure without evidence of cor pulmonale Nonocclusive bilateral pulmonary embolism seen on the CTA.  Transition to oral Eliquis  Echocardiogram was suggestive of normal EF but reduced RV function, elevated PASP, RV hypokinesia.  Lower extremity Dopplers was negative for left lower extremity DVT.  Spoke with IR Dr Juliette AlcideEl Abd, appreciate his input, no role for catheter guided thrombectomy at this point.  Continue bronchodilators  Acute hypoxia with abnormal breath sounds - Suspect reactive lung disease, pulmonary embolism and volume overload.  Continue bronchodilators, prednisone 40 mg daily for 5 days added, IV Lasix.    Sigmoid diverticulitis, uncomplicated Severe Intermittent Abd  pain - Abdominal pain is significantly improved, doing well on oral Augmentin.  He will need a colonoscopy in about 6-8 weeks time after resolution of his diverticulitis. Lipase is normal.    Atrial fibrillation  CHADS-VASc of 0 therefore not on anticoagulation but now he will be due to pulmonary embolism.  Continue his Tikosyn and Toprol-XL   Acute kidney injury on CKD stage II, resolved -Admission creatinine 1.4.  Baseline 1.1  Alcohol abuse  -Alcohol withdrawal protocol     DVT prophylaxis: Heparin drip Code Status: Full code Family Communication:    Status is: Inpatient Patient is still quite hypoxic especially with minimal ambulation requiring more than 5 L nasal cannula.  Maintain hospital stay.   Subjective: At rest patient states symptomatically feels better but with ambulation he gets hypoxic and short of breath. Examination: Constitutional: Not in acute distress Respiratory: Bilateral expiratory wheezing diffusely Cardiovascular: Normal sinus rhythm, no rubs Abdomen: Nontender nondistended good bowel sounds Musculoskeletal: No edema noted Skin: No rashes seen Neurologic: CN 2-12 grossly intact.  And nonfocal Psychiatric: Normal judgment and insight. Alert and oriented x 3. Normal mood.  Objective: Vitals:   06/28/21 1421 06/28/21 2016 06/29/21 0411 06/29/21 0826  BP: 123/90 (!) 145/89 130/82   Pulse: 81 83 86   Resp: (!) 8 20 20    Temp: 98 F (36.7 C) 98.3 F (36.8 C) 97.6 F (36.4 C)   TempSrc: Oral Oral Oral   SpO2: 95% 90% 92% 93%  Weight:      Height:        Intake/Output Summary (Last 24 hours) at 06/29/2021 1017 Last data filed at 06/29/2021 [id number] Gross per  24 hour  Intake 410 ml  Output 1225 ml  Net -815 ml   Filed Weights   06/25/21 1423  Weight: 113.4 kg     Data Reviewed:   CBC: Recent Labs  Lab 06/25/21 1432 06/26/21 0531 06/27/21 0352 06/28/21 0338  WBC 7.3 6.4  6.1 6.6  NEUTROABS 5.4  --   --   --   HGB 17.4* 16.4 15.4 15.4  HCT 51.7 52.8* 47.8 45.8  MCV 93.5 98.7 97.0 96.0  PLT 198 189 188 [id number]   Basic Metabolic Panel: Recent Labs  Lab 06/25/21 1432 06/26/21 0531 06/27/21 0352 06/28/21 0338  NA 135 137 135 137  K 4.1 4.9 5.0 3.8  CL 100 104 98 97*  CO2 29 26 30  33*  GLUCOSE 94 97 116* 125*  BUN 21* 18 13 12   CREATININE 1.40* 1.16 1.02 1.04  CALCIUM 8.7* 8.7* 8.6* 8.8*  MG  --   --  2.1  --    GFR: Estimated Creatinine Clearance: 96.5 mL/min (by C-G formula based on SCr of 1.04 mg/dL). Liver Function Tests: Recent Labs  Lab 06/25/21 1432 06/26/21 0531  AST 31 27  ALT 23 21  ALKPHOS 46 43  BILITOT 1.3* 0.6  PROT 7.8 7.8  ALBUMIN 4.0 3.9   Recent Labs  Lab 06/27/21 0828  LIPASE 37   No results for input(s): AMMONIA in the last 168 hours. Coagulation Profile: Recent Labs  Lab 06/25/21 1649  INR 1.1   Cardiac Enzymes: No results for input(s): CKTOTAL, CKMB, CKMBINDEX, TROPONINI in the last 168 hours. BNP (last 3 results) No results for input(s): PROBNP in the last 8760 hours. HbA1C: No results for input(s): HGBA1C in the last 72 hours. CBG: Recent Labs  Lab 06/28/21 0820  GLUCAP 93   Lipid Profile: No results for input(s): CHOL, HDL, LDLCALC, TRIG, CHOLHDL, LDLDIRECT in the last 72 hours. Thyroid Function Tests: No results for input(s): TSH, T4TOTAL, FREET4, T3FREE, THYROIDAB in the last 72 hours. Anemia Panel: No results for input(s): VITAMINB12, FOLATE, FERRITIN, TIBC, IRON, RETICCTPCT in the last 72 hours. Sepsis Labs: Recent Labs  Lab 06/25/21 1749  LATICACIDVEN 1.3    No results found for this or any previous visit (from the past 240 hour(s)).       Radiology Studies: No results found.      Scheduled Meds:  amoxicillin-clavulanate  1 tablet Oral Q12H   apixaban  10 mg Oral BID   Followed by   Derrill Memo ON 07/04/2021] apixaban  5 mg Oral BID   arformoterol  15 mcg Nebulization BID    dofetilide  500 mcg Oral BID   folic acid  1 mg Oral Daily   loratadine  10 mg Oral Daily   LORazepam  0-4 mg Oral Q12H   metoprolol succinate  100 mg Oral Daily   multivitamin with minerals  1 tablet Oral Daily   pantoprazole  40 mg Oral Daily   predniSONE  40 mg Oral Q breakfast   revefenacin  175 mcg Nebulization Daily   sodium chloride flush  3 mL Intravenous Q12H   thiamine  100 mg Oral Daily   Or   thiamine  100 mg Intravenous Daily   Continuous Infusions:     LOS: 4 days   Time spent= 35 mins    Luane Rochon Arsenio Loader, MD [facility]  If 7PM-7AM, please contact night-coverage  06/29/2021, 10:17 AM

## 2021-06-30 ENCOUNTER — Telehealth: Payer: Self-pay | Admitting: Acute Care

## 2021-06-30 DIAGNOSIS — I2699 Other pulmonary embolism without acute cor pulmonale: Secondary | ICD-10-CM | POA: Diagnosis not present

## 2021-06-30 LAB — BASIC METABOLIC PANEL
Anion gap: 10 (ref 5–15)
BUN: 22 mg/dL — ABNORMAL HIGH (ref 6–20)
CO2: 30 mmol/L (ref 22–32)
Calcium: 8.8 mg/dL — ABNORMAL LOW (ref 8.9–10.3)
Chloride: 93 mmol/L — ABNORMAL LOW (ref 98–111)
Creatinine, Ser: 1.17 mg/dL (ref 0.61–1.24)
GFR, Estimated: >60 mL/min (ref >60)
Glucose, Bld: 151 mg/dL — ABNORMAL HIGH (ref 70–99)
Potassium: 4.1 mmol/L (ref 3.5–5.1)
Sodium: 133 mmol/L — ABNORMAL LOW (ref 135–145)

## 2021-06-30 LAB — MAGNESIUM: Magnesium: 2.4 mg/dL (ref 1.7–2.4)

## 2021-06-30 MED ORDER — PREDNISONE 20 MG PO TABS: 40.0000 mg | ORAL_TABLET | Freq: Every day | ORAL | 0 refills | Status: AC

## 2021-06-30 MED ORDER — ALBUTEROL SULFATE HFA 108 (90 BASE) MCG/ACT IN AERS: 2.0000 | INHALATION_SPRAY | Freq: Four times a day (QID) | RESPIRATORY_TRACT | 3 refills | Status: DC | PRN

## 2021-06-30 MED ORDER — APIXABAN 5 MG PO TABS: ORAL_TABLET | ORAL | 0 refills | Status: DC

## 2021-06-30 MED ORDER — OXYCODONE HCL 5 MG PO CAPS: 5.0000 mg | ORAL_CAPSULE | Freq: Four times a day (QID) | ORAL | 0 refills | Status: DC | PRN

## 2021-06-30 MED ORDER — AMOXICILLIN-POT CLAVULANATE 875-125 MG PO TABS
1.0000 | ORAL_TABLET | Freq: Two times a day (BID) | ORAL | 0 refills | Status: AC
Start: 2021-06-30 — End: 2021-07-04

## 2021-06-30 MED ORDER — PANTOPRAZOLE SODIUM 40 MG PO TBEC: 40.0000 mg | DELAYED_RELEASE_TABLET | Freq: Every day | ORAL | 1 refills | Status: AC

## 2021-06-30 MED ORDER — SENNOSIDES-DOCUSATE SODIUM 8.6-50 MG PO TABS: 1.0000 | ORAL_TABLET | Freq: Every evening | ORAL | 0 refills | Status: DC | PRN

## 2021-06-30 NOTE — TOC Progression Note (Signed)
Transition of Care [facility]) - Progression Note    Patient Details  Name: [name] [name] MRN: [id number] Date of Birth: [birth year]  Transition of Care [facility] CM/SW Contact  Golda Acre, RN Phone Number: 06/30/2021, 7:48 AM  Clinical Narrative:    Transitioned to po eliquis from iv heparin.  Following for toc needs.   Expected Discharge Plan: Home/Self Care Barriers to Discharge: No Barriers Identified  Expected Discharge Plan and Services Expected Discharge Plan: Home/Self Care   Discharge Planning Services: CM Consult   Living arrangements for the past 2 months: Single Family Home                                       Social Determinants of Health (SDOH) Interventions    Readmission Risk Interventions     View : No data to display.

## 2021-06-30 NOTE — Progress Notes (Signed)
SATURATION QUALIFICATIONS: (This note is used to comply with regulatory documentation for home oxygen)  Patient Saturations on Room Air at Rest 88 %  Patient Saturations on Room Air while Ambulating 85 %  Patient Saturations on 2 Liters of oxygen while Ambulating 88 %  Please briefly explain why patient needs home oxygen:

## 2021-06-30 NOTE — Plan of Care (Signed)
  Problem: Education: Goal: Knowledge of General Education information will improve Description: Including pain rating scale, medication(s)/side effects and non-pharmacologic comfort measures 06/30/2021 1430 by Charmian Muff, RN Outcome: Progressing 06/30/2021 1330 by Charmian Muff, RN Outcome: Progressing   Problem: Health Behavior/Discharge Planning: Goal: Ability to manage health-related needs will improve 06/30/2021 1430 by Charmian Muff, RN Outcome: Progressing 06/30/2021 1330 by Charmian Muff, RN Outcome: Progressing   Problem: Clinical Measurements: Goal: Ability to maintain clinical measurements within normal limits will improve 06/30/2021 1430 by Charmian Muff, RN Outcome: Progressing 06/30/2021 1330 by Charmian Muff, RN Outcome: Progressing Goal: Will remain free from infection 06/30/2021 1430 by Charmian Muff, RN Outcome: Progressing 06/30/2021 1330 by Charmian Muff, RN Outcome: Progressing

## 2021-06-30 NOTE — Discharge Summary (Addendum)
Physician Discharge Summary  [name] [name] [id number] DOB: 26-May-1960 DOA: 06/25/2021  PCP: Leonard Downing, MD  Admit date: 06/25/2021 Discharge date: 06/30/2021  Admitted From: Home Disposition: Home  Recommendations for Outpatient Follow-up:  Follow up with PCP in 1-2 weeks Please obtain BMP/CBC in one week your next doctors visit.  Oral Eliquis prescribed twice daily.  Should be on this minimum 1 year unless if complication arises and can be reevaluated outpatient PPI daily Oral Augmentin for his diverticulitis for 4 more days P.o. prednisone and bronchodilator prescribed Outpatient follow-up with pulmonary-sleep study-COPD Home oxygen to be arranged, 3 L  Equipment/Devices: 3 L oxygen Discharge Condition: Stable CODE STATUS: Full code Diet recommendation: Regular  Brief/Interim Summary: 61 year old with history of atrial fibrillation not on anticoagulation, obesity, COPD, alcohol use came to the ED with complaints of abdominal pain and shortness of breath.  The symptoms have been ongoing for the past 2 days and outpatient there is suspicion for bladder infection therefore was started on Bactrim.  In ED CTA chest showed bilateral pulmonary embolism, CT of the abdomen pelvis showed mild uncomplicated diverticulitis and nonobstructive renal stone.  He lives a quite sedentary lifestyle at home.  Echocardiogram was suggestive of normal EF but reduced RV function, elevated PASP, RV hypokinesia.  Lower extremity Dopplers was negative for left lower extremity DVT.  IR consulted - no need for thrombectomy.  Hospital course complicated by hypoxia likely from reactive airway disease and some fluid overload requiring bronchodilators, steroids and IV Lasix. Over the course of several days patient's abdominal symptoms improved therefore IV antibiotics were switched to oral Augmentin.  Breathing symptoms also improved without any chest pain, shortness of breath, lightheadedness, dizziness.   Patient did remain slightly hypoxic upon discharge as mentioned above therefore will require short-term home oxygen.  He would also benefit from outpatient sleep study before referral has been made. Medically stable for discharge as mentioned above on oral Eliquis.     Assessment & Plan:  Principal Problem:   PE (pulmonary thromboembolism) ([place]) Active Problems:   Persistent atrial fibrillation ([facility])   Sigmoid diverticulitis   CKD (chronic kidney disease), stage II   OSA (obstructive sleep apnea)      Pulmonary embolism; acute hypoxic respiratory failure without evidence of cor pulmonale Nonocclusive bilateral pulmonary embolism seen on the CTA.  Transition to oral Eliquis  Echocardiogram was suggestive of normal EF but reduced RV function, elevated PASP, RV hypokinesia.  Lower extremity Dopplers was negative for left lower extremity DVT.  Spoke with IR Dr Denna Haggard, appreciate his input, no role for catheter guided thrombectomy at this point.  Patient will be discharged on oral Eliquis.  Due to hypoxia will also need home oxygen.   Acute hypoxia with abnormal breath sounds - Suspect reactive lung disease, pulmonary embolism and volume overload.  Better but still has some hypoxia secondary likely to PE.  We will prescribe him a short course of prednisone, bronchodilators and Eliquis. His ambulatory pulse ox is down to 84% on room air requiring 3 L nasal cannula which has been arranged for prior to discharge.   Sigmoid diverticulitis, uncomplicated Severe Intermittent Abd  pain -Abdominal pain has improved.  We will give Augmentin to complete her course upon discharge.   Atrial fibrillation  CHADS-VASc of 0 therefore not on anticoagulation but now he will be due to pulmonary embolism.  Continue his Tikosyn and Toprol-XL   Acute kidney injury on CKD stage II, resolved -Resolved   Alcohol abuse  -  Alcohol withdrawal protocol   I suspect patient also has some form of sleep apnea.  He had  sleep study done several years ago.  I will make referral again, also spoke with pulmonary team who will help arrange for outpatient follow-up.   Consultations: None  Subjective: Doing well no complaints.  Denies any chest pain, shortness of breath and lightheadedness with ambulation but he does get hypoxic requiring 3 L of nasal cannula.  Discharge Exam: Vitals:   06/30/21 0453 06/30/21 0814  BP: (!) 151/90   Pulse: 97   Resp: 20   Temp: (!) 97.4 F (36.3 C)   SpO2: 93% 92%   Vitals:   06/29/21 1123 06/29/21 2043 06/30/21 0453 06/30/21 0814  BP:  (!) 152/77 (!) 151/90   Pulse:  98 97   Resp:  20 20   Temp:  98.1 F (36.7 C) (!) 97.4 F (36.3 C)   TempSrc:  Oral Oral   SpO2: 90% 90% 93% 92%  Weight:      Height:        General: Pt is alert, awake, not in acute distress Cardiovascular: RRR, S1/S2 +, no rubs, no gallops Respiratory: CTA bilaterally, no wheezing, no rhonchi Abdominal: Soft, NT, ND, bowel sounds + Extremities: no edema, no cyanosis  Discharge Instructions  Discharge Instructions     Ambulatory referral to Pulmonology   Complete by: As directed    Sleep Study and COPD   Reason for referral: Other      Allergies as of 06/30/2021   No Known Allergies      Medication List     STOP taking these medications    ondansetron 4 MG disintegrating tablet Commonly known as: Zofran ODT   oxyCODONE-acetaminophen 5-325 MG tablet Commonly known as: PERCOCET/ROXICET   torsemide 20 MG tablet Commonly known as: DEMADEX       TAKE these medications    albuterol 108 (90 Base) MCG/ACT inhaler Commonly known as: VENTOLIN HFA Inhale 2 puffs into the lungs every 6 (six) hours as needed for wheezing or shortness of breath.   amoxicillin-clavulanate 875-125 MG tablet Commonly known as: AUGMENTIN Take 1 tablet by mouth every 12 (twelve) hours for 4 days.   apixaban 5 MG Tabs tablet Commonly known as: ELIQUIS Take 2 tablets (10 mg total) by mouth 2  (two) times daily for 4 days, THEN 1 tablet (5 mg total) 2 (two) times daily for 26 days. Start taking on: Jun 30, 2021   dofetilide 500 MCG capsule Commonly known as: TIKOSYN TAKE 1 CAPSULE BY MOUTH 2 TIMES DAILY.   magnesium oxide 400 (241.3 Mg) MG tablet Commonly known as: MAG-OX Take 1 tablet (400 mg total) by mouth daily.   metoprolol succinate 100 MG 24 hr tablet Commonly known as: TOPROL-XL TAKE 1 TABLET BY MOUTH DAILY.   oxycodone 5 MG capsule Commonly known as: OXY-IR Take 1 capsule (5 mg total) by mouth every 6 (six) hours as needed for pain.   pantoprazole 40 MG tablet Commonly known as: PROTONIX Take 1 tablet (40 mg total) by mouth daily.   predniSONE 20 MG tablet Commonly known as: DELTASONE Take 2 tablets (40 mg total) by mouth daily with breakfast for 3 days.   senna-docusate 8.6-50 MG tablet Commonly known as: Senokot-S Take 1 tablet by mouth at bedtime as needed for mild constipation.               Durable Medical Equipment  (From admission, onward)  Start     Ordered   06/30/21 1108  For home use only DME oxygen  Once       Comments: Diagnoses acute pulmonary embolism  Question Answer Comment  Length of Need 6 Months   Mode or (Route) Nasal cannula   Liters per Minute 3   Oxygen delivery system Gas      06/30/21 1108            No Known Allergies  You were cared for by a hospitalist during your hospital stay. If you have any questions about your discharge medications or the care you received while you were in the hospital after you are discharged, you can call the unit and asked to speak with the hospitalist on call if the hospitalist that took care of you is not available. Once you are discharged, your primary care physician will handle any further medical issues. Please note that no refills for any discharge medications will be authorized once you are discharged, as it is imperative that you return to your primary care  physician (or establish a relationship with a primary care physician if you do not have one) for your aftercare needs so that they can reassess your need for medications and monitor your lab values.   Procedures/Studies: CT Angio Chest PE W/Cm &/Or Wo Cm  Result Date: 06/25/2021 CLINICAL DATA:  Shortness of breath and hypoxia. Atrial fibrillation. Lower abdominal pain. Dark urine. High probability for pulmonary embolism. EXAM: CT ANGIOGRAPHY CHEST CT ABDOMEN AND PELVIS WITH CONTRAST TECHNIQUE: Multidetector CT imaging of the chest was performed using the standard protocol during bolus administration of intravenous contrast. Multiplanar CT image reconstructions and MIPs were obtained to evaluate the vascular anatomy. Multidetector CT imaging of the abdomen and pelvis was performed using the standard protocol during bolus administration of intravenous contrast. RADIATION DOSE REDUCTION: This exam was performed according to the departmental dose-optimization program which includes automated exposure control, adjustment of the mA and/or kV according to patient size and/or use of iterative reconstruction technique. CONTRAST:  171mL OMNIPAQUE IOHEXOL 350 MG/ML SOLN COMPARISON:  05/03/2019 FINDINGS: CTA CHEST FINDINGS Cardiovascular: Nonocclusive pulmonary embolism is seen in the segmental branches of the bilateral upper, right middle, and right lower lobar pulmonary arteries. No evidence of thoracic aortic aneurysm or dissection. Mediastinum/Nodes: No masses or pathologically enlarged lymph nodes identified. Lungs/Pleura: No pulmonary mass, infiltrate, or effusion. Mild atelectasis or scarring in both lower lungs. Mild centrilobular emphysema also noted. Musculoskeletal: No suspicious bone lesions identified. Review of the MIP images confirms the above findings. CT ABDOMEN and PELVIS FINDINGS Hepatobiliary: No hepatic masses identified. Gallbladder is unremarkable. No evidence of biliary ductal dilatation. Pancreas:   No mass or inflammatory changes. Spleen: Within normal limits in size and appearance. Adrenals/Urinary Tract: No masses identified. Tiny punctate calculus noted in lower pole of right kidney. No evidence of ureteral calculi or hydronephrosis. Stomach/Bowel: Mild sigmoid diverticulitis is demonstrated. No evidence of abscess or bowel perforation. Vascular/Lymphatic: No pathologically enlarged lymph nodes. No acute vascular findings. Aortic atherosclerotic calcification incidentally noted. Reproductive:  No mass or other significant abnormality. Other:  None. Musculoskeletal:  No suspicious bone lesions identified. Review of the MIP images confirms the above findings. IMPRESSION: Nonocclusive pulmonary embolism in bilateral segmental pulmonary artery branches. Mild sigmoid diverticulitis. No evidence of abscess or bowel perforation. Tiny nonobstructing right renal calculus. Aortic Atherosclerosis (ICD10-I70.0) and Emphysema (ICD10-J43.9). Critical Value/emergent results were called by telephone at the time of interpretation on 06/25/2021 at 4:55 pm to provider LAURA  MURPHY , who verbally acknowledged these results. Electronically Signed   By: Marlaine Hind M.D.   On: 06/25/2021 16:56   CT Abdomen Pelvis W Contrast  Result Date: 06/25/2021 CLINICAL DATA:  Shortness of breath and hypoxia. Atrial fibrillation. Lower abdominal pain. Dark urine. High probability for pulmonary embolism. EXAM: CT ANGIOGRAPHY CHEST CT ABDOMEN AND PELVIS WITH CONTRAST TECHNIQUE: Multidetector CT imaging of the chest was performed using the standard protocol during bolus administration of intravenous contrast. Multiplanar CT image reconstructions and MIPs were obtained to evaluate the vascular anatomy. Multidetector CT imaging of the abdomen and pelvis was performed using the standard protocol during bolus administration of intravenous contrast. RADIATION DOSE REDUCTION: This exam was performed according to the departmental dose-optimization  program which includes automated exposure control, adjustment of the mA and/or kV according to patient size and/or use of iterative reconstruction technique. CONTRAST:  141mL OMNIPAQUE IOHEXOL 350 MG/ML SOLN COMPARISON:  05/03/2019 FINDINGS: CTA CHEST FINDINGS Cardiovascular: Nonocclusive pulmonary embolism is seen in the segmental branches of the bilateral upper, right middle, and right lower lobar pulmonary arteries. No evidence of thoracic aortic aneurysm or dissection. Mediastinum/Nodes: No masses or pathologically enlarged lymph nodes identified. Lungs/Pleura: No pulmonary mass, infiltrate, or effusion. Mild atelectasis or scarring in both lower lungs. Mild centrilobular emphysema also noted. Musculoskeletal: No suspicious bone lesions identified. Review of the MIP images confirms the above findings. CT ABDOMEN and PELVIS FINDINGS Hepatobiliary: No hepatic masses identified. Gallbladder is unremarkable. No evidence of biliary ductal dilatation. Pancreas:  No mass or inflammatory changes. Spleen: Within normal limits in size and appearance. Adrenals/Urinary Tract: No masses identified. Tiny punctate calculus noted in lower pole of right kidney. No evidence of ureteral calculi or hydronephrosis. Stomach/Bowel: Mild sigmoid diverticulitis is demonstrated. No evidence of abscess or bowel perforation. Vascular/Lymphatic: No pathologically enlarged lymph nodes. No acute vascular findings. Aortic atherosclerotic calcification incidentally noted. Reproductive:  No mass or other significant abnormality. Other:  None. Musculoskeletal:  No suspicious bone lesions identified. Review of the MIP images confirms the above findings. IMPRESSION: Nonocclusive pulmonary embolism in bilateral segmental pulmonary artery branches. Mild sigmoid diverticulitis. No evidence of abscess or bowel perforation. Tiny nonobstructing right renal calculus. Aortic Atherosclerosis (ICD10-I70.0) and Emphysema (ICD10-J43.9). Critical Value/emergent  results were called by telephone at the time of interpretation on 06/25/2021 at 4:55 pm to provider [facility] , who verbally acknowledged these results. Electronically Signed   By: Marlaine Hind M.D.   On: 06/25/2021 16:56   DG Chest Port 1 View  Result Date: 06/25/2021 CLINICAL DATA:  Shortness of breath. History of atrial fibrillation. EXAM: PORTABLE CHEST 1 VIEW COMPARISON:  12/12/2019 FINDINGS: The cardiomediastinal silhouette is unremarkable. Mild bibasilar atelectasis/scarring is noted, increased on the RIGHT. No airspace disease, pleural effusion or pneumothorax noted. No acute bony abnormalities are present. IMPRESSION: Mild bibasilar atelectasis/scarring, increased on the RIGHT. Electronically Signed   By: Margarette Canada M.D.   On: 06/25/2021 15:30   ECHOCARDIOGRAM COMPLETE  Result Date: 06/26/2021    ECHOCARDIOGRAM REPORT   Patient Name:   [name] [name] Date of Exam: 06/26/2021 Medical Rec #:  [id number]         Height:       70.0 in Accession #:    [id number]        Weight:       250.0 lb Date of Birth:  [birth year]          BSA:          2.295 m Patient Age:  61 years          BP:           130/67 mmHg Patient Gender: M                 HR:           105 bpm. Exam Location:  Inpatient Procedure: 2D Echo, Color Doppler, Cardiac Doppler and Strain Analysis Indications:    Pulmonary embolism  History:        Patient has prior history of Echocardiogram examinations, most                 recent 05/30/2019. CHF, COPD; Arrythmias:Atrial Fibrillation.  Sonographer:    Joette Catching RCS Referring Phys: [id number] TIMOTHY S OPYD  Sonographer Comments: Patient is morbidly obese. Global longitudinal strain was attempted. IMPRESSIONS  1. Left ventricular ejection fraction, by estimation, is 60 to 65%. The left ventricle has normal function. The left ventricle has no regional wall motion abnormalities. There is mild left ventricular hypertrophy. Left ventricular diastolic parameters were normal.  2. Right  ventricular systolic function is mildly reduced. The right ventricular size is mildly enlarged. There is moderately elevated pulmonary artery systolic pressure. The estimated right ventricular systolic pressure is 123456 mmHg. RV free wall hypokinesis with normal function at apex, consistent with McConnell's sign as can be seen in acute PE  3. Right atrial size was mildly dilated.  4. The mitral valve is normal in structure. No evidence of mitral valve regurgitation.  5. The aortic valve is tricuspid. Aortic valve regurgitation is not visualized. No aortic stenosis is present.  6. The inferior vena cava is dilated in size with >50% respiratory variability, suggesting right atrial pressure of 8 mmHg. FINDINGS  Left Ventricle: Left ventricular ejection fraction, by estimation, is 60 to 65%. The left ventricle has normal function. The left ventricle has no regional wall motion abnormalities. The left ventricular internal cavity size was normal in size. There is  mild left ventricular hypertrophy. Left ventricular diastolic parameters were normal. Right Ventricle: The right ventricular size is mildly enlarged. Right vetricular wall thickness was not well visualized. Right ventricular systolic function is mildly reduced. There is moderately elevated pulmonary artery systolic pressure. The tricuspid  regurgitant velocity is 3.11 m/s, and with an assumed right atrial pressure of 8 mmHg, the estimated right ventricular systolic pressure is 123456 mmHg. Left Atrium: Left atrial size was normal in size. Right Atrium: Right atrial size was mildly dilated. Pericardium: Trivial pericardial effusion is present. Mitral Valve: The mitral valve is normal in structure. No evidence of mitral valve regurgitation. Tricuspid Valve: The tricuspid valve is normal in structure. Tricuspid valve regurgitation is trivial. Aortic Valve: The aortic valve is tricuspid. Aortic valve regurgitation is not visualized. No aortic stenosis is present.  Aortic valve mean gradient measures 4.0 mmHg. Aortic valve peak gradient measures 6.8 mmHg. Aortic valve area, by VTI measures 3.01 cm. Pulmonic Valve: The pulmonic valve was not well visualized. Pulmonic valve regurgitation is not visualized. Aorta: The aortic root and ascending aorta are structurally normal, with no evidence of dilitation. Venous: The inferior vena cava is dilated in size with greater than 50% respiratory variability, suggesting right atrial pressure of 8 mmHg. IAS/Shunts: The interatrial septum was not well visualized.  LEFT VENTRICLE PLAX 2D LVIDd:         4.80 cm   Diastology LVIDs:         2.60 cm   LV e' medial:    8.38  cm/s LV PW:         1.00 cm   LV E/e' medial:  10.7 LV IVS:        1.10 cm   LV e' lateral:   10.60 cm/s LVOT diam:     2.10 cm   LV E/e' lateral: 8.5 LV SV:         72 LV SV Index:   31 LVOT Area:     3.46 cm  RIGHT VENTRICLE             IVC RV Basal diam:  4.10 cm     IVC diam: 2.20 cm RV Mid diam:    3.80 cm RV S prime:     13.30 cm/s TAPSE (M-mode): 2.0 cm LEFT ATRIUM             Index        RIGHT ATRIUM           Index LA diam:        4.10 cm 1.79 cm/m   RA Area:     23.10 cm LA Vol (A2C):   40.9 ml 17.82 ml/m  RA Volume:   73.50 ml  32.03 ml/m LA Vol (A4C):   41.3 ml 18.00 ml/m LA Biplane Vol: 41.2 ml 17.95 ml/m  AORTIC VALVE                    PULMONIC VALVE AV Area (Vmax):    3.12 cm     PV Vmax:       0.86 m/s AV Area (Vmean):   3.13 cm     PV Peak grad:  2.9 mmHg AV Area (VTI):     3.01 cm AV Vmax:           130.00 cm/s AV Vmean:          89.600 cm/s AV VTI:            0.238 m AV Peak Grad:      6.8 mmHg AV Mean Grad:      4.0 mmHg LVOT Vmax:         117.00 cm/s LVOT Vmean:        81.000 cm/s LVOT VTI:          0.207 m LVOT/AV VTI ratio: 0.87  AORTA Ao Root diam: 3.50 cm Ao Asc diam:  3.00 cm MITRAL VALVE               TRICUSPID VALVE MV Area (PHT): 5.13 cm    TR Peak grad:   38.7 mmHg MV Decel Time: 148 msec    TR Vmax:        311.00 cm/s MV E velocity:  90.00 cm/s MV A velocity: 87.80 cm/s  SHUNTS MV E/A ratio:  1.03        Systemic VTI:  0.21 m                            Systemic Diam: 2.10 cm Oswaldo Milian MD Electronically signed by Oswaldo Milian MD Signature Date/Time: 06/26/2021/1:07:44 PM    Final    VAS [place] LOWER EXTREMITY VENOUS (DVT)  Result Date: 06/27/2021  Lower Venous DVT Study Patient Name:  [name] [name]  Date of Exam:   06/26/2021 Medical Rec #: [id number]          Accession #:    [id number] Date of Birth: [birth year]  Patient Gender: M Patient Age:   73 years Exam Location:  [facility] Procedure:      VAS [place] LOWER EXTREMITY VENOUS (DVT) Referring Phys: TIMOTHY OPYD --------------------------------------------------------------------------------  Indications: Pulmonary embolism.  Risk Factors: Confirmed PE. Anticoagulation: Heparin. Limitations: Poor ultrasound/tissue interface and body habitus. Comparison Study: No prior studies. Performing Technologist: Oliver Hum RVT  Examination Guidelines: A complete evaluation includes B-mode imaging, spectral Doppler, color Doppler, and power Doppler as needed of all accessible portions of each vessel. Bilateral testing is considered an integral part of a complete examination. Limited examinations for reoccurring indications may be performed as noted. The reflux portion of the exam is performed with the patient in reverse Trendelenburg.  +---------+---------------+---------+-----------+----------+-------------------+ RIGHT    CompressibilityPhasicitySpontaneityPropertiesThrombus Aging      +---------+---------------+---------+-----------+----------+-------------------+ CFV      Full           Yes      Yes                                      +---------+---------------+---------+-----------+----------+-------------------+ SFJ      Full                                                              +---------+---------------+---------+-----------+----------+-------------------+ FV Prox  Full                                                             +---------+---------------+---------+-----------+----------+-------------------+ FV Mid   Full                                                             +---------+---------------+---------+-----------+----------+-------------------+ FV DistalFull                                                             +---------+---------------+---------+-----------+----------+-------------------+ PFV      Full                                                             +---------+---------------+---------+-----------+----------+-------------------+ POP      Full           Yes      Yes                                      +---------+---------------+---------+-----------+----------+-------------------+ PTV      Full                                                             +---------+---------------+---------+-----------+----------+-------------------+  PERO                                                  Not well visualized +---------+---------------+---------+-----------+----------+-------------------+   +---------+---------------+---------+-----------+----------+-------------------+ LEFT     CompressibilityPhasicitySpontaneityPropertiesThrombus Aging      +---------+---------------+---------+-----------+----------+-------------------+ CFV      Partial        Yes      Yes                  Acute               +---------+---------------+---------+-----------+----------+-------------------+ SFJ      Full                                                             +---------+---------------+---------+-----------+----------+-------------------+ FV Prox  None           No       No                   Acute               +---------+---------------+---------+-----------+----------+-------------------+ FV  Mid   Full                                                             +---------+---------------+---------+-----------+----------+-------------------+ FV DistalFull                                                             +---------+---------------+---------+-----------+----------+-------------------+ PFV      Full                                                             +---------+---------------+---------+-----------+----------+-------------------+ POP      Full           Yes      Yes                                      +---------+---------------+---------+-----------+----------+-------------------+ PTV      Full                                                             +---------+---------------+---------+-----------+----------+-------------------+ PERO  Not well visualized +---------+---------------+---------+-----------+----------+-------------------+     Summary: RIGHT: - There is no evidence of deep vein thrombosis in the lower extremity. However, portions of this examination were limited- see technologist comments above.  - No cystic structure found in the popliteal fossa.  LEFT: - Findings consistent with acute deep vein thrombosis involving the left common femoral vein, and left femoral vein. - No cystic structure found in the popliteal fossa.  *See table(s) above for measurements and observations. Electronically signed by Monica Martinez MD on 06/27/2021 at 5:08:32 PM.    Final      The results of significant diagnostics from this hospitalization (including imaging, microbiology, ancillary and laboratory) are listed below for reference.     Microbiology: No results found for this or any previous visit (from the past 240 hour(s)).   Labs: BNP (last 3 results) Recent Labs    06/25/21 1432  BNP [id number]   Basic Metabolic Panel: Recent Labs  Lab 06/25/21 1432 06/26/21 0531 06/27/21 0352  06/28/21 0338 06/30/21 0359  NA 135 137 135 137 133*  K 4.1 4.9 5.0 3.8 4.1  CL 100 104 98 97* 93*  CO2 29 26 30  33* 30  GLUCOSE 94 97 116* 125* 151*  BUN 21* 18 13 12  22*  CREATININE 1.40* 1.16 1.02 1.04 1.17  CALCIUM 8.7* 8.7* 8.6* 8.8* 8.8*  MG  --   --  2.1  --  2.4   Liver Function Tests: Recent Labs  Lab 06/25/21 1432 06/26/21 0531  AST 31 27  ALT 23 21  ALKPHOS 46 43  BILITOT 1.3* 0.6  PROT 7.8 7.8  ALBUMIN 4.0 3.9   Recent Labs  Lab 06/27/21 0828  LIPASE 37   No results for input(s): AMMONIA in the last 168 hours. CBC: Recent Labs  Lab 06/25/21 1432 06/26/21 0531 06/27/21 0352 06/28/21 0338  WBC 7.3 6.4 6.1 6.6  NEUTROABS 5.4  --   --   --   HGB 17.4* 16.4 15.4 15.4  HCT 51.7 52.8* 47.8 45.8  MCV 93.5 98.7 97.0 96.0  PLT 198 189 188 182   Cardiac Enzymes: No results for input(s): CKTOTAL, CKMB, CKMBINDEX, TROPONINI in the last 168 hours. BNP: Invalid input(s): POCBNP CBG: Recent Labs  Lab 06/28/21 0820  GLUCAP 93   D-Dimer No results for input(s): DDIMER in the last 72 hours. Hgb A1c No results for input(s): HGBA1C in the last 72 hours. Lipid Profile No results for input(s): CHOL, HDL, LDLCALC, TRIG, CHOLHDL, LDLDIRECT in the last 72 hours. Thyroid function studies No results for input(s): TSH, T4TOTAL, T3FREE, THYROIDAB in the last 72 hours.  Invalid input(s): FREET3 Anemia work up No results for input(s): VITAMINB12, FOLATE, FERRITIN, TIBC, IRON, RETICCTPCT in the last 72 hours. Urinalysis    Component Value Date/Time   COLORURINE YELLOW 06/25/2021 1618   APPEARANCEUR CLEAR 06/25/2021 1618   LABSPEC 1.025 06/25/2021 1618   PHURINE 6.5 06/25/2021 1618   GLUCOSEU 100 (A) 06/25/2021 1618   HGBUR NEGATIVE 06/25/2021 1618   BILIRUBINUR NEGATIVE 06/25/2021 1618   KETONESUR NEGATIVE 06/25/2021 1618   PROTEINUR NEGATIVE 06/25/2021 1618   NITRITE NEGATIVE 06/25/2021 1618   LEUKOCYTESUR NEGATIVE 06/25/2021 1618   Sepsis Labs Invalid  input(s): PROCALCITONIN,  WBC,  LACTICIDVEN Microbiology No results found for this or any previous visit (from the past 240 hour(s)).   Time coordinating discharge:  I have spent 35 minutes face to face with the patient and on the ward discussing the patients care, assessment, plan and  disposition with other care givers. >50% of the time was devoted counseling the patient about the risks and benefits of treatment/Discharge disposition and coordinating care.   SIGNED:   Damita Lack, MD  [facility] 06/30/2021, 11:43 AM   If 7PM-7AM, please contact night-coverage

## 2021-06-30 NOTE — Plan of Care (Signed)

## 2021-06-30 NOTE — Telephone Encounter (Signed)
Patient is scheduled for HFU on 07/15/2021 at 2pm with Buelah Manis, NP. Appointment reminder mailed.

## 2021-06-30 NOTE — TOC Transition Note (Signed)
Transition of Care [facility]) - CM/SW Discharge Note   Patient Details  Name: [name] [name] MRN: [id number] Date of Birth: Jun 14, 1960  Transition of Care [facility]) CM/SW Contact:  Golda Acre, RN Phone Number: 06/30/2021, 11:52 AM   Clinical Narrative:    Patient dcd to home with o2 through [employer]   Final next level of care: Home/Self Care Barriers to Discharge: No Barriers Identified   Patient Goals and CMS Choice Patient states their goals for this hospitalization and ongoing recovery are:: to go home CMS Medicare.gov Compare Post Acute Care list provided to:: Patient Choice offered to / list presented to : Patient  Discharge Placement                       Discharge Plan and Services   Discharge Planning Services: CM Consult Post Acute Care Choice: Durable Medical Equipment          DME Arranged: Oxygen DME Agency: [employer] Date DME Agency Contacted: 06/30/21 Time DME Agency Contacted: [id number] Representative spoke with at DME Agency: [email or web address]            Social Determinants of Health (SDOH) Interventions     Readmission Risk Interventions     View : No data to display.

## 2021-07-15 ENCOUNTER — Inpatient Hospital Stay: Payer: Medicaid Other | Admitting: Primary Care

## 2021-07-19 ENCOUNTER — Inpatient Hospital Stay: Payer: Medicaid Other | Admitting: Primary Care

## 2021-07-19 NOTE — Progress Notes (Deleted)
@Patient  ID: [name] [name], male    DOB: [birth year], 61 y.o.   MRN: [id number]  No chief complaint on file.   Referring provider: Leonard [name], *  HPI: 61 year old male, former smoker (30-pack-year history).  Past medical history significant for COPD, OSA, hypertension, CHF, A-fib, pulmonary embolism, chronic kidney disease stage II, obesity.  07/19/2021 Patient was admitted on 06/25/2021 for pulmonary embolism, respiratory failure and diverticulitis.  Patient has a history of A-fib fib not previously on anticoagulation.  CTA showed bilateral pulmonary embolism.  Echocardiogram suggestive of normal EF but reduced RV function, elevated pulmonary artery systolic pressure.  Lower extremity Dopplers were negative for DVT.  IR consulted no need for thrombectomy.  CT of abdomen pelvis showed mild uncomplicated diverticulitis.   Hospital course was complicated by hypoxia likely due to reactive airway disease and fluid overload during bronchodilators, steroids and IV Lasix.  He was discharged on Eliquis, oxygen and p.o. Augmentin.  Patient felt to benefit from possible outpatient sleep study.  Need to be on blood thinner for minimum of 1 year.  Presents today for hospital follow-up.   May need PFTs and in lab sleep study Needs simple walk test in office to assess continue oxygen need  No Known Allergies  Immunization History  Administered Date(s) Administered   Influenza,inj,Quad PF,6+ Mos 02/23/2018    Past Medical History:  Diagnosis Date   Chronic diastolic CHF (congestive heart failure) (HCC)    COPD (chronic obstructive pulmonary disease) (HCC)    Hypertension    Obesity (BMI 30-39.9) 12/15/2016   OSA (obstructive sleep apnea)    Persistent atrial fibrillation ([place]) 12/15/2016   Visit for monitoring Tikosyn therapy     Tobacco History: Social History   Tobacco Use  Smoking Status Former   Packs/day: 1.00   Years: 30.00   Total pack years: 30.00   Types: Cigarettes   Smokeless Tobacco Never   Counseling given: Not Answered   Outpatient Medications Prior to Visit  Medication Sig Dispense Refill   albuterol (VENTOLIN HFA) 108 (90 Base) MCG/ACT inhaler Inhale 2 puffs into the lungs every 6 (six) hours as needed for wheezing or shortness of breath. 6.7 g 3   apixaban (ELIQUIS) 5 MG TABS tablet Take 2 tablets (10 mg total) by mouth 2 (two) times daily for 4 days, THEN 1 tablet (5 mg total) 2 (two) times daily for 26 days. 68 tablet 0   dofetilide (TIKOSYN) 500 MCG capsule TAKE 1 CAPSULE BY MOUTH 2 TIMES DAILY. 180 capsule 3   magnesium oxide (MAG-OX) 400 (241.3 Mg) MG tablet Take 1 tablet (400 mg total) by mouth daily. (Patient not taking: Reported on 06/25/2021) 30 tablet 6   metoprolol succinate (TOPROL-XL) 100 MG 24 hr tablet TAKE 1 TABLET BY MOUTH DAILY. 90 tablet 3   oxycodone (OXY-IR) 5 MG capsule Take 1 capsule (5 mg total) by mouth every 6 (six) hours as needed for pain. 15 capsule 0   pantoprazole (PROTONIX) 40 MG tablet Take 1 tablet (40 mg total) by mouth daily. 30 tablet 1   senna-docusate (SENOKOT-S) 8.6-50 MG tablet Take 1 tablet by mouth at bedtime as needed for mild constipation. 30 tablet 0   No facility-administered medications prior to visit.      Review of Systems  Review of Systems   Physical Exam  There were no vitals taken for this visit. Physical Exam   Lab Results:  CBC    Component Value Date/Time   WBC 6.6 06/28/2021 0338  RBC 4.77 06/28/2021 0338   HGB 15.4 06/28/2021 0338   HGB 15.2 02/08/2018 1319   HCT 45.8 06/28/2021 0338   HCT 43.6 02/08/2018 1319   PLT 182 06/28/2021 0338   PLT 251 02/08/2018 1319   MCV 96.0 06/28/2021 0338   MCV 92 02/08/2018 1319   MCH 32.3 06/28/2021 0338   MCHC 33.6 06/28/2021 0338   RDW 14.3 06/28/2021 0338   RDW 12.5 02/08/2018 1319   LYMPHSABS 1.2 06/25/2021 1432   MONOABS 0.4 06/25/2021 1432   EOSABS 0.3 06/25/2021 1432   BASOSABS 0.1 06/25/2021 1432    BMET     Component Value Date/Time   NA 133 (L) 06/30/2021 0359   NA 142 09/06/2020 1541   K 4.1 06/30/2021 0359   CL 93 (L) 06/30/2021 0359   CO2 30 06/30/2021 0359   GLUCOSE 151 (H) 06/30/2021 0359   BUN 22 (H) 06/30/2021 0359   BUN 14 09/06/2020 1541   CREATININE 1.17 06/30/2021 0359   CALCIUM 8.8 (L) 06/30/2021 0359   GFRNONAA >60 06/30/2021 0359   GFRAA 92 05/30/2019 1211    BNP    Component Value Date/Time   BNP 44.8 06/25/2021 1432    ProBNP No results found for: "PROBNP"  Imaging: VAS [place] LOWER EXTREMITY VENOUS (DVT)  Result Date: 06/27/2021  Lower Venous DVT Study Patient Name:  [name] [name]  Date of Exam:   06/26/2021 Medical Rec #: [id number]          Accession #:    [id number] Date of Birth: 07-11-1960           Patient Gender: M Patient Age:   8 years Exam Location:  [facility] Procedure:      VAS [place] LOWER EXTREMITY VENOUS (DVT) Referring Phys: Christia Reading OPYD --------------------------------------------------------------------------------  Indications: Pulmonary embolism.  Risk Factors: Confirmed PE. Anticoagulation: Heparin. Limitations: Poor ultrasound/tissue interface and body habitus. Comparison Study: No prior studies. Performing Technologist: Oliver Hum RVT  Examination Guidelines: A complete evaluation includes B-mode imaging, spectral Doppler, color Doppler, and power Doppler as needed of all accessible portions of each vessel. Bilateral testing is considered an integral part of a complete examination. Limited examinations for reoccurring indications may be performed as noted. The reflux portion of the exam is performed with the patient in reverse Trendelenburg.  +---------+---------------+---------+-----------+----------+-------------------+ RIGHT    CompressibilityPhasicitySpontaneityPropertiesThrombus Aging      +---------+---------------+---------+-----------+----------+-------------------+ CFV      Full           Yes      Yes                                       +---------+---------------+---------+-----------+----------+-------------------+ SFJ      Full                                                             +---------+---------------+---------+-----------+----------+-------------------+ FV Prox  Full                                                             +---------+---------------+---------+-----------+----------+-------------------+  FV Mid   Full                                                             +---------+---------------+---------+-----------+----------+-------------------+ FV DistalFull                                                             +---------+---------------+---------+-----------+----------+-------------------+ PFV      Full                                                             +---------+---------------+---------+-----------+----------+-------------------+ POP      Full           Yes      Yes                                      +---------+---------------+---------+-----------+----------+-------------------+ PTV      Full                                                             +---------+---------------+---------+-----------+----------+-------------------+ PERO                                                  Not well visualized +---------+---------------+---------+-----------+----------+-------------------+   +---------+---------------+---------+-----------+----------+-------------------+ LEFT     CompressibilityPhasicitySpontaneityPropertiesThrombus Aging      +---------+---------------+---------+-----------+----------+-------------------+ CFV      Partial        Yes      Yes                  Acute               +---------+---------------+---------+-----------+----------+-------------------+ SFJ      Full                                                              +---------+---------------+---------+-----------+----------+-------------------+ FV Prox  None           No       No                   Acute               +---------+---------------+---------+-----------+----------+-------------------+ FV Mid   Full                                                             +---------+---------------+---------+-----------+----------+-------------------+  FV DistalFull                                                             +---------+---------------+---------+-----------+----------+-------------------+ PFV      Full                                                             +---------+---------------+---------+-----------+----------+-------------------+ POP      Full           Yes      Yes                                      +---------+---------------+---------+-----------+----------+-------------------+ PTV      Full                                                             +---------+---------------+---------+-----------+----------+-------------------+ PERO                                                  Not well visualized +---------+---------------+---------+-----------+----------+-------------------+     Summary: RIGHT: - There is no evidence of deep vein thrombosis in the lower extremity. However, portions of this examination were limited- see technologist comments above.  - No cystic structure found in the popliteal fossa.  LEFT: - Findings consistent with acute deep vein thrombosis involving the left common femoral vein, and left femoral vein. - No cystic structure found in the popliteal fossa.  *See table(s) above for measurements and observations. Electronically signed by Monica Martinez MD on 06/27/2021 at 5:08:32 PM.    Final    ECHOCARDIOGRAM COMPLETE  Result Date: 06/26/2021    ECHOCARDIOGRAM REPORT   Patient Name:   ARGLE MICA Date of Exam: 06/26/2021 Medical Rec #:  [id number]         Height:        70.0 in Accession #:    [id number]        Weight:       250.0 lb Date of Birth:  1960/10/22          BSA:          2.295 m Patient Age:    80 years          BP:           130/67 mmHg Patient Gender: M                 HR:           105 bpm. Exam Location:  Inpatient Procedure: 2D Echo, Color Doppler, Cardiac Doppler and Strain Analysis Indications:    Pulmonary embolism  History:        Patient has prior history of  Echocardiogram examinations, most                 recent 05/30/2019. CHF, COPD; Arrythmias:Atrial Fibrillation.  Sonographer:    Joette Catching RCS Referring Phys: [id number] TIMOTHY S OPYD  Sonographer Comments: Patient is morbidly obese. Global longitudinal strain was attempted. IMPRESSIONS  1. Left ventricular ejection fraction, by estimation, is 60 to 65%. The left ventricle has normal function. The left ventricle has no regional wall motion abnormalities. There is mild left ventricular hypertrophy. Left ventricular diastolic parameters were normal.  2. Right ventricular systolic function is mildly reduced. The right ventricular size is mildly enlarged. There is moderately elevated pulmonary artery systolic pressure. The estimated right ventricular systolic pressure is 123456 mmHg. RV free wall hypokinesis with normal function at apex, consistent with McConnell's sign as can be seen in acute PE  3. Right atrial size was mildly dilated.  4. The mitral valve is normal in structure. No evidence of mitral valve regurgitation.  5. The aortic valve is tricuspid. Aortic valve regurgitation is not visualized. No aortic stenosis is present.  6. The inferior vena cava is dilated in size with >50% respiratory variability, suggesting right atrial pressure of 8 mmHg. FINDINGS  Left Ventricle: Left ventricular ejection fraction, by estimation, is 60 to 65%. The left ventricle has normal function. The left ventricle has no regional wall motion abnormalities. The left ventricular internal cavity size was normal in size.  There is  mild left ventricular hypertrophy. Left ventricular diastolic parameters were normal. Right Ventricle: The right ventricular size is mildly enlarged. Right vetricular wall thickness was not well visualized. Right ventricular systolic function is mildly reduced. There is moderately elevated pulmonary artery systolic pressure. The tricuspid  regurgitant velocity is 3.11 m/s, and with an assumed right atrial pressure of 8 mmHg, the estimated right ventricular systolic pressure is 123456 mmHg. Left Atrium: Left atrial size was normal in size. Right Atrium: Right atrial size was mildly dilated. Pericardium: Trivial pericardial effusion is present. Mitral Valve: The mitral valve is normal in structure. No evidence of mitral valve regurgitation. Tricuspid Valve: The tricuspid valve is normal in structure. Tricuspid valve regurgitation is trivial. Aortic Valve: The aortic valve is tricuspid. Aortic valve regurgitation is not visualized. No aortic stenosis is present. Aortic valve mean gradient measures 4.0 mmHg. Aortic valve peak gradient measures 6.8 mmHg. Aortic valve area, by VTI measures 3.01 cm. Pulmonic Valve: The pulmonic valve was not well visualized. Pulmonic valve regurgitation is not visualized. Aorta: The aortic root and ascending aorta are structurally normal, with no evidence of dilitation. Venous: The inferior vena cava is dilated in size with greater than 50% respiratory variability, suggesting right atrial pressure of 8 mmHg. IAS/Shunts: The interatrial septum was not well visualized.  LEFT VENTRICLE PLAX 2D LVIDd:         4.80 cm   Diastology LVIDs:         2.60 cm   LV e' medial:    8.38 cm/s LV PW:         1.00 cm   LV E/e' medial:  10.7 LV IVS:        1.10 cm   LV e' lateral:   10.60 cm/s LVOT diam:     2.10 cm   LV E/e' lateral: 8.5 LV SV:         72 LV SV Index:   31 LVOT Area:     3.46 cm  RIGHT VENTRICLE  IVC RV Basal diam:  4.10 cm     IVC diam: 2.20 cm RV Mid diam:    3.80 cm  RV S prime:     13.30 cm/s TAPSE (M-mode): 2.0 cm LEFT ATRIUM             Index        RIGHT ATRIUM           Index LA diam:        4.10 cm 1.79 cm/m   RA Area:     23.10 cm LA Vol (A2C):   40.9 ml 17.82 ml/m  RA Volume:   73.50 ml  32.03 ml/m LA Vol (A4C):   41.3 ml 18.00 ml/m LA Biplane Vol: 41.2 ml 17.95 ml/m  AORTIC VALVE                    PULMONIC VALVE AV Area (Vmax):    3.12 cm     PV Vmax:       0.86 m/s AV Area (Vmean):   3.13 cm     PV Peak grad:  2.9 mmHg AV Area (VTI):     3.01 cm AV Vmax:           130.00 cm/s AV Vmean:          89.600 cm/s AV VTI:            0.238 m AV Peak Grad:      6.8 mmHg AV Mean Grad:      4.0 mmHg LVOT Vmax:         117.00 cm/s LVOT Vmean:        81.000 cm/s LVOT VTI:          0.207 m LVOT/AV VTI ratio: 0.87  AORTA Ao Root diam: 3.50 cm Ao Asc diam:  3.00 cm MITRAL VALVE               TRICUSPID VALVE MV Area (PHT): 5.13 cm    TR Peak grad:   38.7 mmHg MV Decel Time: 148 msec    TR Vmax:        311.00 cm/s MV E velocity: 90.00 cm/s MV A velocity: 87.80 cm/s  SHUNTS MV E/A ratio:  1.03        Systemic VTI:  0.21 m                            Systemic Diam: 2.10 cm Oswaldo Milian MD Electronically signed by Oswaldo Milian MD Signature Date/Time: 06/26/2021/1:07:44 PM    Final    CT Angio Chest PE W/Cm &/Or Wo Cm  Result Date: 06/25/2021 CLINICAL DATA:  Shortness of breath and hypoxia. Atrial fibrillation. Lower abdominal pain. Dark urine. High probability for pulmonary embolism. EXAM: CT ANGIOGRAPHY CHEST CT ABDOMEN AND PELVIS WITH CONTRAST TECHNIQUE: Multidetector CT imaging of the chest was performed using the standard protocol during bolus administration of intravenous contrast. Multiplanar CT image reconstructions and MIPs were obtained to evaluate the vascular anatomy. Multidetector CT imaging of the abdomen and pelvis was performed using the standard protocol during bolus administration of intravenous contrast. RADIATION DOSE REDUCTION: This exam was  performed according to the departmental dose-optimization program which includes automated exposure control, adjustment of the mA and/or kV according to patient size and/or use of iterative reconstruction technique. CONTRAST:  145mL OMNIPAQUE IOHEXOL 350 MG/ML SOLN COMPARISON:  05/03/2019 FINDINGS: CTA CHEST FINDINGS Cardiovascular: Nonocclusive pulmonary embolism is seen in  the segmental branches of the bilateral upper, right middle, and right lower lobar pulmonary arteries. No evidence of thoracic aortic aneurysm or dissection. Mediastinum/Nodes: No masses or pathologically enlarged lymph nodes identified. Lungs/Pleura: No pulmonary mass, infiltrate, or effusion. Mild atelectasis or scarring in both lower lungs. Mild centrilobular emphysema also noted. Musculoskeletal: No suspicious bone lesions identified. Review of the MIP images confirms the above findings. CT ABDOMEN and PELVIS FINDINGS Hepatobiliary: No hepatic masses identified. Gallbladder is unremarkable. No evidence of biliary ductal dilatation. Pancreas:  No mass or inflammatory changes. Spleen: Within normal limits in size and appearance. Adrenals/Urinary Tract: No masses identified. Tiny punctate calculus noted in lower pole of right kidney. No evidence of ureteral calculi or hydronephrosis. Stomach/Bowel: Mild sigmoid diverticulitis is demonstrated. No evidence of abscess or bowel perforation. Vascular/Lymphatic: No pathologically enlarged lymph nodes. No acute vascular findings. Aortic atherosclerotic calcification incidentally noted. Reproductive:  No mass or other significant abnormality. Other:  None. Musculoskeletal:  No suspicious bone lesions identified. Review of the MIP images confirms the above findings. IMPRESSION: Nonocclusive pulmonary embolism in bilateral segmental pulmonary artery branches. Mild sigmoid diverticulitis. No evidence of abscess or bowel perforation. Tiny nonobstructing right renal calculus. Aortic Atherosclerosis  (ICD10-I70.0) and Emphysema (ICD10-J43.9). Critical Value/emergent results were called by telephone at the time of interpretation on 06/25/2021 at 4:55 pm to provider [facility] , who verbally acknowledged these results. Electronically Signed   By: Marlaine Hind M.D.   On: 06/25/2021 16:56   CT Abdomen Pelvis W Contrast  Result Date: 06/25/2021 CLINICAL DATA:  Shortness of breath and hypoxia. Atrial fibrillation. Lower abdominal pain. Dark urine. High probability for pulmonary embolism. EXAM: CT ANGIOGRAPHY CHEST CT ABDOMEN AND PELVIS WITH CONTRAST TECHNIQUE: Multidetector CT imaging of the chest was performed using the standard protocol during bolus administration of intravenous contrast. Multiplanar CT image reconstructions and MIPs were obtained to evaluate the vascular anatomy. Multidetector CT imaging of the abdomen and pelvis was performed using the standard protocol during bolus administration of intravenous contrast. RADIATION DOSE REDUCTION: This exam was performed according to the departmental dose-optimization program which includes automated exposure control, adjustment of the mA and/or kV according to patient size and/or use of iterative reconstruction technique. CONTRAST:  128mL OMNIPAQUE IOHEXOL 350 MG/ML SOLN COMPARISON:  05/03/2019 FINDINGS: CTA CHEST FINDINGS Cardiovascular: Nonocclusive pulmonary embolism is seen in the segmental branches of the bilateral upper, right middle, and right lower lobar pulmonary arteries. No evidence of thoracic aortic aneurysm or dissection. Mediastinum/Nodes: No masses or pathologically enlarged lymph nodes identified. Lungs/Pleura: No pulmonary mass, infiltrate, or effusion. Mild atelectasis or scarring in both lower lungs. Mild centrilobular emphysema also noted. Musculoskeletal: No suspicious bone lesions identified. Review of the MIP images confirms the above findings. CT ABDOMEN and PELVIS FINDINGS Hepatobiliary: No hepatic masses identified. Gallbladder is  unremarkable. No evidence of biliary ductal dilatation. Pancreas:  No mass or inflammatory changes. Spleen: Within normal limits in size and appearance. Adrenals/Urinary Tract: No masses identified. Tiny punctate calculus noted in lower pole of right kidney. No evidence of ureteral calculi or hydronephrosis. Stomach/Bowel: Mild sigmoid diverticulitis is demonstrated. No evidence of abscess or bowel perforation. Vascular/Lymphatic: No pathologically enlarged lymph nodes. No acute vascular findings. Aortic atherosclerotic calcification incidentally noted. Reproductive:  No mass or other significant abnormality. Other:  None. Musculoskeletal:  No suspicious bone lesions identified. Review of the MIP images confirms the above findings. IMPRESSION: Nonocclusive pulmonary embolism in bilateral segmental pulmonary artery branches. Mild sigmoid diverticulitis. No evidence of abscess or bowel perforation. Tiny  nonobstructing right renal calculus. Aortic Atherosclerosis (ICD10-I70.0) and Emphysema (ICD10-J43.9). Critical Value/emergent results were called by telephone at the time of interpretation on 06/25/2021 at 4:55 pm to provider [facility] , who verbally acknowledged these results. Electronically Signed   By: Marlaine Hind M.D.   On: 06/25/2021 16:56   DG Chest Port 1 View  Result Date: 06/25/2021 CLINICAL DATA:  Shortness of breath. History of atrial fibrillation. EXAM: PORTABLE CHEST 1 VIEW COMPARISON:  12/12/2019 FINDINGS: The cardiomediastinal silhouette is unremarkable. Mild bibasilar atelectasis/scarring is noted, increased on the RIGHT. No airspace disease, pleural effusion or pneumothorax noted. No acute bony abnormalities are present. IMPRESSION: Mild bibasilar atelectasis/scarring, increased on the RIGHT. Electronically Signed   By: Margarette Canada M.D.   On: 06/25/2021 15:30     Assessment & Plan:   No problem-specific Assessment & Plan notes found for this encounter.     Martyn Ehrich,  NP 07/19/2021

## 2021-08-02 ENCOUNTER — Telehealth: Payer: Self-pay | Admitting: Cardiology

## 2021-08-02 MED ORDER — APIXABAN 5 MG PO TABS: 5.0000 mg | ORAL_TABLET | Freq: Two times a day (BID) | ORAL | 1 refills | Status: DC

## 2021-08-02 NOTE — Telephone Encounter (Signed)
Eliquis 5mg  refill request received. Patient is 61 years old, weight-113.4kg, Crea-1.17 on 06/30/2021, Diagnosis-PE-new & Afib, and last seen by Dr. Elberta Fortis on 09/06/2020. Dose is appropriate based on dosing criteria. Will send in refill to requested pharmacy.   Per hospital d/c note on 06/25/2021: Oral Eliquis prescribed twice daily. Should be on this minimum 1 year unless if complication arises and can be reevaluated outpatient

## 2021-08-26 ENCOUNTER — Encounter: Payer: Self-pay | Admitting: Primary Care

## 2021-08-26 ENCOUNTER — Ambulatory Visit (INDEPENDENT_AMBULATORY_CARE_PROVIDER_SITE_OTHER): Payer: Medicaid Other | Admitting: Primary Care

## 2021-08-26 ENCOUNTER — Other Ambulatory Visit: Payer: Self-pay | Admitting: Cardiology

## 2021-08-26 VITALS — BP 126/78 | HR 82 | Temp 98.0°F | Ht 70.0 in | Wt 266.2 lb

## 2021-08-26 DIAGNOSIS — J9611 Chronic respiratory failure with hypoxia: Secondary | ICD-10-CM | POA: Diagnosis not present

## 2021-08-26 DIAGNOSIS — Z87891 Personal history of nicotine dependence: Secondary | ICD-10-CM | POA: Diagnosis not present

## 2021-08-26 DIAGNOSIS — G4733 Obstructive sleep apnea (adult) (pediatric): Secondary | ICD-10-CM

## 2021-08-26 DIAGNOSIS — I2699 Other pulmonary embolism without acute cor pulmonale: Secondary | ICD-10-CM | POA: Diagnosis not present

## 2021-08-26 DIAGNOSIS — R0602 Shortness of breath: Secondary | ICD-10-CM

## 2021-08-26 LAB — BRAIN NATRIURETIC PEPTIDE: Pro B Natriuretic peptide (BNP): 54 pg/mL (ref 0.0–100.0)

## 2021-08-26 NOTE — Assessment & Plan Note (Addendum)
-   SpO2 low 82% p ambulating 500 feet on room air, requiring 2L oxygen to maintain O2 >90%.  - Checking BNP

## 2021-08-26 NOTE — Patient Instructions (Addendum)
  Recommendations: - Trial Trelegy- take 1 puff daily in the morning (2 week sample, notify if this is helpful and we will send in prescription) - Use albuterol (ventolin) every 4-6 hours for breakthrough shortness of breath/wheezing - Wear 2L oxygen with moderate exertion and at bedtime  - Continue Eliquis twice daily as prescribed   Orders: Labs today (ordered) CPAP auto titrate 10-20cm h20 (ordered) Pulmonary function testing (ordered)  Follow-up: 4 weeks with either Dr. Maple Hudson or Dr. Craige Cotta (30 mins visit)/ 1 hour PFT prior

## 2021-08-26 NOTE — Progress Notes (Signed)
@Patient  ID: , male    DOB: 1960/08/06, 61 y.o.   MRN: [id number]  Chief Complaint  Patient presents with   Hospitalization Follow-up    Pt states that when he was discharged from hospital he felt great and then 3-4 days he started feeling bad again.SOB with exertion in his trailer.     Referring provider: [id number], *  HPI: 61 year old male, former smoker quit in 2009. PMH significant for hypertensive heart disease, afib, pulmonary embolism, OSA, CKD stage II, shortness of breath, obesity.   08/26/2021- interim hx  Patient presents today for overdue hospital follow-up.  He was admitted in May for pulmonary embolism without cor pulmonale. Echocardiogram was suggestive of normal EF but reduced RV function, elevated PASP, RV hypokinesia. Lower extremity dopplers were negative for DVT. Discharged on Eliquis twice daily and 3 L of oxygen.  He will need a minimum of 1 year anticoagulation treatment. Recommended outpatient pulmonary follow-up for sleep study and management of COPD.   Breathing is some worse since discharge, he is having episodes of shortness of breath with exertion. No associated cough, chest tightness or wheezing. Feels he has lost all his stamina. His breathing is worse when at home. When he is outside he doesn't have as much trouble. He is concerned about mold in his home. He has oxygen at home but did not bring with him to visit today. He mainly wears oxygen at night. He has history of sleep apnea, he was supposed to be set up on CPAP but never was able to go get machine. Continues to snore.   Sleep study results:  Split night 01/22/2017>>AHI 24.5/hr  CPAP Titration 06/07/2017 >> optimal pressure 15   No Known Allergies  Immunization History  Administered Date(s) Administered   Influenza,inj,Quad PF,6+ Mos 02/23/2018    Past Medical History:  Diagnosis Date   Chronic diastolic CHF (congestive heart failure) (HCC)    COPD (chronic obstructive  pulmonary disease) (HCC)    Hypertension    Obesity (BMI 30-39.9) 12/15/2016   OSA (obstructive sleep apnea)    Persistent atrial fibrillation (HCC) 12/15/2016   Visit for monitoring Tikosyn therapy     Tobacco History: Social History   Tobacco Use  Smoking Status Former   Packs/day: 1.00   Years: 30.00   Total pack years: 30.00   Types: Cigarettes   Quit date: 2009   Years since quitting: 14.5   Passive exposure: Past  Smokeless Tobacco Never   Counseling given: Not Answered   Outpatient Medications Prior to Visit  Medication Sig Dispense Refill   albuterol (VENTOLIN HFA) 108 (90 Base) MCG/ACT inhaler Inhale 2 puffs into the lungs every 6 (six) hours as needed for wheezing or shortness of breath. 6.7 g 3   apixaban (ELIQUIS) 5 MG TABS tablet Take 1 tablet (5 mg total) by mouth 2 (two) times daily. 180 tablet 1   dofetilide (TIKOSYN) 500 MCG capsule TAKE 1 CAPSULE BY MOUTH 2 TIMES DAILY. 180 capsule 3   magnesium oxide (MAG-OX) 400 (241.3 Mg) MG tablet Take 1 tablet (400 mg total) by mouth daily. 30 tablet 6   metoprolol succinate (TOPROL-XL) 100 MG 24 hr tablet TAKE 1 TABLET BY MOUTH DAILY. 90 tablet 3   oxycodone (OXY-IR) 5 MG capsule Take 1 capsule (5 mg total) by mouth every 6 (six) hours as needed for pain. 15 capsule 0   pantoprazole (PROTONIX) 40 MG tablet Take 1 tablet (40 mg total) by mouth daily.  30 tablet 1   senna-docusate (SENOKOT-S) 8.6-50 MG tablet Take 1 tablet by mouth at bedtime as needed for mild constipation. 30 tablet 0   No facility-administered medications prior to visit.   Review of Systems  Review of Systems  Constitutional: Negative.   HENT: Negative.    Respiratory:  Positive for shortness of breath. Negative for cough, chest tightness and wheezing.   Cardiovascular:  Positive for leg swelling.  Psychiatric/Behavioral:  Positive for sleep disturbance.    Physical Exam  BP 126/78 (BP Location: Left Arm, Patient Position: Sitting, Cuff Size:  Large)   Pulse 82   Temp 98 F (36.7 C) (Oral)   Ht 5\' 10"  (1.778 m)   Wt 266 lb 3.2 oz (120.7 kg)   SpO2 93%   BMI 38.20 kg/m  Physical Exam Constitutional:      Appearance: Normal appearance. He is obese.  HENT:     Head: Normocephalic and atraumatic.     Mouth/Throat:     Mouth: Mucous membranes are moist.     Pharynx: Oropharynx is clear.  Cardiovascular:     Rate and Rhythm: Normal rate and regular rhythm.     Comments: +2-3 BLE edema Pulmonary:     Effort: Pulmonary effort is normal.     Breath sounds: Wheezing present. No rhonchi or rales.  Musculoskeletal:        General: Normal range of motion.     Cervical back: Normal range of motion and neck supple.  Skin:    General: Skin is warm and dry.  Neurological:     General: No focal deficit present.     Mental Status: He is alert and oriented to person, place, and time. Mental status is at baseline.  Psychiatric:        Mood and Affect: Mood normal.        Behavior: Behavior normal.        Thought Content: Thought content normal.        Judgment: Judgment normal.      Lab Results:  CBC    Component Value Date/Time   WBC 6.6 06/28/2021 0338   RBC 4.77 06/28/2021 0338   HGB 15.4 06/28/2021 0338   HGB 15.2 02/08/2018 1319   HCT 45.8 06/28/2021 0338   HCT 43.6 02/08/2018 1319   PLT 182 06/28/2021 0338   PLT 251 02/08/2018 1319   MCV 96.0 06/28/2021 0338   MCV 92 02/08/2018 1319   MCH 32.3 06/28/2021 0338   MCHC 33.6 06/28/2021 0338   RDW 14.3 06/28/2021 0338   RDW 12.5 02/08/2018 1319   LYMPHSABS 1.2 06/25/2021 1432   MONOABS 0.4 06/25/2021 1432   EOSABS 0.3 06/25/2021 1432   BASOSABS 0.1 06/25/2021 1432    BMET    Component Value Date/Time   NA 133 (L) 06/30/2021 0359   NA 142 09/06/2020 1541   K 4.1 06/30/2021 0359   CL 93 (L) 06/30/2021 0359   CO2 30 06/30/2021 0359   GLUCOSE 151 (H) 06/30/2021 0359   BUN 22 (H) 06/30/2021 0359   BUN 14 09/06/2020 1541   CREATININE 1.17 06/30/2021 0359    CALCIUM 8.8 (L) 06/30/2021 0359   GFRNONAA >60 06/30/2021 0359   GFRAA 92 05/30/2019 1211    BNP    Component Value Date/Time   BNP 44.8 06/25/2021 1432    ProBNP    Component Value Date/Time   PROBNP 54.0 08/26/2021 1137    Imaging: No results found.   Assessment & Plan:  Shortness of breath - Former smoker. Patient has dyspnea symptoms with exertion and fatigue. He has audible wheezing on exam. Needs pulmonary function testing to assess for obstructive lung disease. Starting patient on trial Trelegy on puff daily. FU in 1 month or sooner if needed.   OSA (obstructive sleep apnea) - Patient had split night sleep study on 01/22/2017 that showed moderate OSA, AHI 24.5/hr. CPAP titration on 06/07/2017 showed optimal pressure 15cm h20. He was never started on CPAP. He continues to have loud snoring. Placing an order for patient to be started on auto CPAP 10-20cm h20 with mask of choice and heated humidification. May need repeat sleep study at some point. FU with either Dr. Maple Hudson or Dr. Craige Cotta.   PE (pulmonary thromboembolism) (HCC) - Admitted in May 2023 for multiple subsegmental pulmonary embolism without cor pulmonale. Still requiring supplement oxygen with moderate exertion.  - Continue Eliquis 5mg  twice daily, needs 1 year of anticoagulation an minimum   Chronic respiratory failure with hypoxia (HCC) - SpO2 low 82% p ambulating 500 feet on room air, requiring 2L oxygen to maintain O2 >90%.  - Checking BNP    , NP 08/26/2021

## 2021-08-26 NOTE — Assessment & Plan Note (Addendum)
-   Admitted in May 2023 for multiple subsegmental pulmonary embolism without cor pulmonale. Still requiring supplement oxygen with moderate exertion.  - Continue Eliquis 5mg  twice daily, needs 1 year of anticoagulation an minimum

## 2021-08-26 NOTE — Assessment & Plan Note (Addendum)
-   Former smoker. Patient has dyspnea symptoms with exertion and fatigue. He has audible wheezing on exam. Needs pulmonary function testing to assess for obstructive lung disease. Starting patient on trial Trelegy on puff daily. FU in 1 month or sooner if needed.

## 2021-08-26 NOTE — Assessment & Plan Note (Deleted)
-   Discharged on oxygen after being admitted for pulmonary embolism. Still requiring 2L supplemental O2 with moderate ambulation.

## 2021-08-26 NOTE — Telephone Encounter (Signed)
Pt's pharmacy is requesting a refill on pantoprazole. Would Dr. Camnitz like to refill this medication? Please address 

## 2021-08-26 NOTE — Assessment & Plan Note (Addendum)
-   Patient had split night sleep study on 01/22/2017 that showed moderate OSA, AHI 24.5/hr. CPAP titration on 06/07/2017 showed optimal pressure 15cm h20. He was never started on CPAP. He continues to have loud snoring. Placing an order for patient to be started on auto CPAP 10-20cm h20 with mask of choice and heated humidification. May need repeat sleep study at some point. FU with either Dr. Maple Hudson or Dr. Craige Cotta.

## 2021-08-29 LAB — RESPIRATORY ALLERGY PROFILE REGION II ~~LOC~~
Allergen, A. alternata, m6: <0.1 kU/L
Allergen, Cedar tree, t12: <0.1 kU/L
Allergen, Comm Silver Birch, t9: <0.1 kU/L
Allergen, Cottonwood, t14: <0.1 kU/L
Allergen, D pternoyssinus,d7: <0.1 kU/L
Allergen, Mouse Urine Protein, e78: <0.1 kU/L
Allergen, Mulberry, t76: <0.1 kU/L
Allergen, Oak,t7: <0.1 kU/L
Allergen, P. notatum, m1: <0.1 kU/L
Aspergillus fumigatus, m3: <0.1 kU/L
Bermuda Grass: <0.1 kU/L
Box Elder IgE: <0.1 kU/L
CLADOSPORIUM HERBARUM (M2) IGE: <0.1 kU/L
COMMON RAGWEED (SHORT) (W1) IGE: <0.1 kU/L
Cat Dander: <0.1 kU/L
Class: 0
Class: 0
Class: 0
Class: 0
Class: 0
Class: 0
Class: 0
Class: 0
Class: 0
Class: 0
Class: 0
Class: 0
Class: 0
Class: 0
Class: 0
Class: 0
Class: 0
Class: 0
Class: 0
Class: 0
Class: 0
Class: 0
Class: 0
Cockroach: 0.2 kU/L — ABNORMAL HIGH
D. farinae: <0.1 kU/L
Dog Dander: <0.1 kU/L
Elm IgE: <0.1 kU/L
IgE (Immunoglobulin E), Serum: 162 kU/L — ABNORMAL HIGH (ref <=114)
Johnson Grass: <0.1 kU/L
Pecan/Hickory Tree IgE: <0.1 kU/L
Rough Pigweed  IgE: <0.1 kU/L
Sheep Sorrel IgE: <0.1 kU/L
Timothy Grass: <0.1 kU/L

## 2021-08-29 LAB — INTERPRETATION:

## 2021-08-31 NOTE — Progress Notes (Signed)
IgE which is an allergy marker was elevated 162. Indicated allergies could be driving his respiratory symptoms. Resp allergy panel was positive for mild allergy cockroach. Rest of his RAST panel was negative. Does not test for everything just common respiratory allergens. BNP was normal, no evidence of fluid overload or HF.

## 2021-09-05 ENCOUNTER — Telehealth: Payer: Self-pay | Admitting: Primary Care

## 2021-09-05 NOTE — Telephone Encounter (Signed)
Im not able to call him right now as I am in clinic seeing patients. What test is he referring to? We did labs which included allergy testing. IgE which is an allergy marker was elevated 162. Indicated allergies could be driving his respiratory symptoms. Resp allergy panel was positive for mild allergy cockroach. Rest of his RAST panel was negative. Does not test for everything just common respiratory allergens. BNP was normal, no evidence of fluid overload or HF.   I also ordered a breathing test called pulmonary function test to assess his lung function  I gave him a sample fo inhaler called trelegy/ Has he tried that. We can send in albuterol to use as needed in addition to trelegy if he would like.

## 2021-09-05 NOTE — Telephone Encounter (Signed)
Called and spoke to patient about PFTs. And pt states he has used trelegy for a few days but has not seen any difference yet with his breathing. Nothing further needed

## 2021-09-05 NOTE — Telephone Encounter (Signed)
Called patient and he wants to discuss the more expensive test that [facility] and him talked about. And possible medications to help with his breathing.   Please advise [employer]

## 2021-09-05 NOTE — Telephone Encounter (Signed)
Called and left voicemail for patient to call office back for ab work results

## 2021-09-08 NOTE — Progress Notes (Signed)
Reviewed and agree with assessment/plan.   Haylea Schlichting, MD [facility] 09/08/2021, 9:57 AM Pager:  [phone]  

## 2021-10-17 ENCOUNTER — Other Ambulatory Visit (HOSPITAL_COMMUNITY): Payer: Self-pay

## 2021-10-17 ENCOUNTER — Encounter: Payer: Self-pay | Admitting: Pulmonary Disease

## 2021-10-17 ENCOUNTER — Telehealth: Payer: Self-pay

## 2021-10-17 ENCOUNTER — Ambulatory Visit (INDEPENDENT_AMBULATORY_CARE_PROVIDER_SITE_OTHER): Payer: Medicaid Other | Admitting: Pulmonary Disease

## 2021-10-17 VITALS — BP 130/70 | HR 100 | Ht 70.5 in | Wt 267.2 lb

## 2021-10-17 DIAGNOSIS — R0602 Shortness of breath: Secondary | ICD-10-CM

## 2021-10-17 DIAGNOSIS — G4733 Obstructive sleep apnea (adult) (pediatric): Secondary | ICD-10-CM | POA: Diagnosis not present

## 2021-10-17 DIAGNOSIS — J9611 Chronic respiratory failure with hypoxia: Secondary | ICD-10-CM | POA: Diagnosis not present

## 2021-10-17 DIAGNOSIS — J439 Emphysema, unspecified: Secondary | ICD-10-CM

## 2021-10-17 DIAGNOSIS — J45998 Other asthma: Secondary | ICD-10-CM

## 2021-10-17 DIAGNOSIS — J449 Chronic obstructive pulmonary disease, unspecified: Secondary | ICD-10-CM | POA: Diagnosis not present

## 2021-10-17 DIAGNOSIS — J432 Centrilobular emphysema: Secondary | ICD-10-CM | POA: Diagnosis not present

## 2021-10-17 DIAGNOSIS — Z87891 Personal history of nicotine dependence: Secondary | ICD-10-CM

## 2021-10-17 DIAGNOSIS — Z86711 Personal history of pulmonary embolism: Secondary | ICD-10-CM

## 2021-10-17 LAB — PULMONARY FUNCTION TEST
DL/VA % pred: 60 %
DL/VA: 2.57 ml/min/mmHg/L
DLCO cor % pred: 51 %
DLCO cor: 14.61 ml/min/mmHg
DLCO unc % pred: 51 %
DLCO unc: 14.61 ml/min/mmHg
FEF 25-75 Post: 0.86 L/sec
FEF 25-75 Pre: 0.52 L/sec
FEF2575-%Change-Post: 63 %
FEF2575-%Pred-Post: 27 %
FEF2575-%Pred-Pre: 17 %
FEV1-%Change-Post: 23 %
FEV1-%Pred-Post: 41 %
FEV1-%Pred-Pre: 33 %
FEV1-Post: 1.55 L
FEV1-Pre: 1.26 L
FEV1FVC-%Change-Post: 0 %
FEV1FVC-%Pred-Pre: 64 %
FEV6-%Change-Post: 21 %
FEV6-%Pred-Post: 64 %
FEV6-%Pred-Pre: 52 %
FEV6-Post: 3 L
FEV6-Pre: 2.46 L
FEV6FVC-%Change-Post: -1 %
FEV6FVC-%Pred-Post: 99 %
FEV6FVC-%Pred-Pre: 100 %
FVC-%Change-Post: 23 %
FVC-%Pred-Post: 64 %
FVC-%Pred-Pre: 52 %
FVC-Post: 3.15 L
FVC-Pre: 2.55 L
Post FEV1/FVC ratio: 49 %
Post FEV6/FVC ratio: 95 %
Pre FEV1/FVC ratio: 49 %
Pre FEV6/FVC Ratio: 96 %
RV % pred: 239 %
RV: 5.38 L
TLC % pred: 121 %
TLC: 8.64 L

## 2021-10-17 MED ORDER — BREZTRI AEROSPHERE 160-9-4.8 MCG/ACT IN AERO: 2.0000 | INHALATION_SPRAY | Freq: Two times a day (BID) | RESPIRATORY_TRACT | 0 refills | Status: DC

## 2021-10-17 MED ORDER — BREZTRI AEROSPHERE 160-9-4.8 MCG/ACT IN AERO: 2.0000 | INHALATION_SPRAY | Freq: Two times a day (BID) | RESPIRATORY_TRACT | 5 refills | Status: DC

## 2021-10-17 NOTE — Patient Instructions (Signed)
Breztri two puffs in the morning and two puffs in the evening, and rinse your mouth after each use.  Stop using advair once you start using breztri.  Albuterol two puffs every 6 hours as needed for cough, wheeze, chest congestion or shortness of breath.  Will arrange for overnight oxygen test with CPAP.  Use oxygen during the day to keep your oxygen saturation greater than 90%.  Will get a copy of your CPAP report.  Check with your primary care doctor about whether they will be refilling eliquis.  If not, then let us know and we can do that.  Follow up in 6 to 8 weeks.

## 2021-10-17 NOTE — Progress Notes (Signed)
[facility]  Chief Complaint  Patient presents with   Consult    DME: [facility] PFT results Cpap machine and 2-3L of oxygen managed by PCP    Past Surgical History:  He  has a past surgical history that includes Hernia repair; Cardioversion (N/A, 12/22/2016); Hemorroidectomy; Cardiac catheterization (02/22/2018); and RIGHT/LEFT HEART CATH AND CORONARY ANGIOGRAPHY (N/A, 02/22/2018).  Past Medical History:  Diastolic CHF, HTN, Persistent atrial fibrillation, Diverticulosis, Pulmonary embolism May 2023, CKD 2  Constitutional:  BP 130/70 (BP Location: Left Arm, Cuff Size: Large)   Pulse 100   Ht 5' 10.5" (1.791 m)   Wt 267 lb 3.2 oz (121.2 kg)   SpO2 91%   BMI 37.80 kg/m   Brief Summary:  [name] [name] is a 61 y.o. 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      Subjective:   RAST from July showed very mild reaction to cockroach.  IgE 162.  CBC from May showed eosinophil level 0.3 K/uL.  PFT today shows severe obstruction, positive bronchodilator response, air trapping, and moderate diffusion defect.  He tried breztri before and this worked great, but was too expensive then.  This was a couple of years ago.  He tried trelegy, but it didn't help.  He has been using advair.  This helps some, but he still feels like his breathing is short.  He moved to a new home to get away from mold exposure.  He quit smoking about 14 years ago.  He denies animal/bird exposure.  Not working currently.  His father had COPD in his 55's.    He uses oxygen intermittently during the day.  His SpO2 drops to the mid 80's at home when he is walking without oxygen on.  He uses his CPAP nightly.  Pressure is comfortable.  Has full face mask.  No issues with mask fit.  Physical Exam:   Appearance - well kempt   ENMT - no sinus tenderness, no  oral exudate, no LAN, Mallampati 4 airway, no stridor, wears dentures, poor dentition  Respiratory - decreased breath sounds bilaterally, no wheezing or rales  CV - s1s2 regular rate and rhythm, no murmurs  Ext - no clubbing, no edema  Skin - no rashes  Psych - normal mood and affect   Pulmonary testing:  RAST 08/26/21 >> IgE 162,  mild cockroach PFT 10/17/21 >> FEV1 1.55 (41%), FEV1% 49, TLC 8.64 (121%), RV 6.02 (162%), DLCO 51%  Chest Imaging:  CT angio chest 06/25/21 >> PE in segmental b/l uppler/middle lobes and RLL, mild centrilobular emphysema  Sleep Tests:  PSG 01/22/17 >> AHI 24.5, SpO2 low 79% CPAP 06/07/17 >> CPAP 15 cm H2O >> AHI 4.7  Cardiac Tests:  Echo 06/26/21 >> EF 60 to 65%, mild LVH, RVSP 46.7 mmHg, +McConnell's sign  Social History:  He  reports that he quit smoking about 14 years ago. His smoking use included cigarettes. He has a 30.00 pack-year smoking history. He has been exposed to tobacco smoke. He has never used smokeless tobacco. He reports current alcohol use of about 4.0 standard drinks of alcohol per week. He reports that he does not use drugs.  Family History:  His family history includes Atrial fibrillation in his brother; CAD in his brother; COPD in his father and mother.     Assessment/Plan:   COPD with asthma and emphysema. - reviewed  his PFT - previously tried trelegy, but wasn't effective - has been using advair, but still having difficulties with his breathing - will have him try breztri again; samples provided - prn albuterol  Obstructive sleep apnea. - he is compliant with CPAP and reports benefit from therapy - uses [facility] for his DME - current CPAP ordered July 2023 - will get a copy of his CPAP download  Chronic respiratory failure with hypoxia. - goal SpO2 > 90% - using 2 liters with exertion - will arrange for ONO with CPAP; he might need a titration study if his SpO2 is low at night  Pulmonary embolism from May 2023. - he  will check with his PCP whether they will be refilling scripts for eliquis  Persistent atrial fibrillation. - followed by Dr. Loman Brooklyn with cardiology - he is to follow up with cardiology to determine if he needs to reconsider long term anticoagulation since this might impact decision regarding management of pulmonary embolism  Time Spent Involved in Patient Care on Day of Examination:  39 minutes  Follow up:   Patient Instructions  Breztri two puffs in the morning and two puffs in the evening, and rinse your mouth after each use.  Stop using advair once you start using breztri.  Albuterol two puffs every 6 hours as needed for cough, wheeze, chest congestion or shortness of breath.  Will arrange for overnight oxygen test with CPAP.  Use oxygen during the day to keep your oxygen saturation greater than 90%.  Will get a copy of your CPAP report.  Check with your primary care doctor about whether they will be refilling eliquis.  If not, then let us know and we can do that.  Follow up in 6 to 8 weeks.  Medication List:   Allergies as of 10/17/2021   No Known Allergies      Medication List        Accurate as of October 17, 2021  2:08 PM. If you have any questions, ask your nurse or doctor.          STOP taking these medications    magnesium oxide 400 (241.3 Mg) MG tablet Commonly known as: MAG-OX Stopped by: Coralyn Helling, MD   oxycodone 5 MG capsule Commonly known as: OXY-IR Stopped by: Coralyn Helling, MD   senna-docusate 8.6-50 MG tablet Commonly known as: Senokot-S Stopped by: Coralyn Helling, MD       TAKE these medications    albuterol 108 (90 Base) MCG/ACT inhaler Commonly known as: VENTOLIN HFA Inhale 2 puffs into the lungs every 6 (six) hours as needed for wheezing or shortness of breath.   apixaban 5 MG Tabs tablet Commonly known as: Eliquis Take 1 tablet (5 mg total) by mouth 2 (two) times daily.   Breztri Aerosphere 160-9-4.8 MCG/ACT  Aero Generic drug: Budeson-Glycopyrrol-Formoterol Inhale 2 puffs into the lungs in the morning and at bedtime. Started by: Coralyn Helling, MD   Jerrye Bushy [phone] MCG/ACT Aero Generic drug: Budeson-Glycopyrrol-Formoterol Inhale 2 puffs into the lungs in the morning and at bedtime. Started by: Coralyn Helling, MD   dofetilide 500 MCG capsule Commonly known as: TIKOSYN TAKE 1 CAPSULE BY MOUTH 2 TIMES DAILY.   metoprolol succinate 100 MG 24 hr tablet Commonly known as: TOPROL-XL TAKE 1 TABLET BY MOUTH DAILY.   pantoprazole 40 MG tablet Commonly known as: PROTONIX Take 1 tablet (40 mg total) by mouth daily.        Signature:  Coralyn Helling, MD [facility] Pager - [phone] 10/17/2021, 2:08 PM

## 2021-10-17 NOTE — Progress Notes (Signed)
Please let patient know PFTs showed severe obstructive lung disease with reversibility consistent with mixed COPD/asthma. Needs to continue trelegy. Make sure he has a follow-up scheduled

## 2021-10-17 NOTE — Progress Notes (Signed)
PFT done today. 

## 2021-10-17 NOTE — Telephone Encounter (Signed)
Patient Advocate Encounter   Received notification from [facility] that prior authorization is required for [employer] 160-9-4.8MCG/ACT aerosol.  PA submitted and APPROVED on 10/17/2021  Key [id number] Effective: 10/03/2021  - 10/17/2022.    Georga Bora Rx Patient Advocate

## 2021-10-20 ENCOUNTER — Other Ambulatory Visit: Payer: Self-pay | Admitting: Cardiology

## 2021-10-26 NOTE — Progress Notes (Signed)
Looks like Dr. Halford Chessman wanted him to stay on Breztri and PA was approved. Would keep him on this inhaler if he is doing ok and discontinue trelegy

## 2021-11-01 ENCOUNTER — Telehealth: Payer: Self-pay | Admitting: Pulmonary Disease

## 2021-11-01 NOTE — Telephone Encounter (Signed)
Called and spoke to patient about recommendations from Dr. Halford Chessman. He voiced understanding and denied CPAP pressures needing to be changed for now. Advised him to call back if he decided otherwise. Nothing further needed at this time.

## 2021-11-01 NOTE — Telephone Encounter (Signed)
Auto CPAP 09/20/21 to 10/19/21 >> used on 16 of 30 nights with average 2 hrs 30 min.  Average AHI 2.8 with median CPAP 10 and 95 th percentile CPAP 11 cm H2O   Please let him know his CPAP report shows good control of sleep apnea, but he needs to use CPAP for the entire time he is asleep to get maximal benefit.  If he feels the pressure setting is too high, then please send an order to change his auto CPAP to 7 - 14 cm H2O.  He uses [facility] for his DME.

## 2021-11-18 ENCOUNTER — Other Ambulatory Visit: Payer: Self-pay | Admitting: Cardiology

## 2021-12-01 ENCOUNTER — Other Ambulatory Visit: Payer: Self-pay | Admitting: Cardiology

## 2021-12-12 ENCOUNTER — Ambulatory Visit: Payer: Medicaid Other | Admitting: Pulmonary Disease

## 2021-12-16 ENCOUNTER — Other Ambulatory Visit: Payer: Self-pay | Admitting: Cardiology

## 2021-12-30 ENCOUNTER — Other Ambulatory Visit: Payer: Self-pay | Admitting: Cardiology

## 2022-01-11 ENCOUNTER — Other Ambulatory Visit: Payer: Self-pay | Admitting: Cardiology

## 2022-01-11 NOTE — Telephone Encounter (Signed)
Rx refill sent to pharmacy. 

## 2022-01-25 ENCOUNTER — Other Ambulatory Visit: Payer: Self-pay | Admitting: Cardiology

## 2022-01-27 ENCOUNTER — Other Ambulatory Visit: Payer: Self-pay | Admitting: Cardiology

## 2022-01-27 DIAGNOSIS — I4819 Other persistent atrial fibrillation: Secondary | ICD-10-CM

## 2022-01-27 NOTE — Telephone Encounter (Signed)
Pt aware he needs an appointment, that he is overdue for follow up and we are getting refills requests. Aware office will send in refills to get him through appt next month, and that he would need to keep appt. Pt was agreeable to plan.

## 2022-01-27 NOTE — Telephone Encounter (Addendum)
Eliquis 5mg  refill request received. Patient is 61 years old, weight-121.2kg, Crea-1.17 on 06/30/2021, Diagnosis-Afib, and last seen by Will Camnitz on 09/06/2020-NEEDS Appt Dose is appropriate based on dosing criteria.   PT NEEDS AN APPT WITH CARDIOLOGIST  Sent Schedulers a message and pt has an appt on 02/10/2022. Will send in requested refill at at this time.

## 2022-02-02 ENCOUNTER — Other Ambulatory Visit: Payer: Self-pay

## 2022-02-02 DIAGNOSIS — I4819 Other persistent atrial fibrillation: Secondary | ICD-10-CM

## 2022-02-02 MED ORDER — DOFETILIDE 500 MCG PO CAPS
ORAL_CAPSULE | ORAL | 0 refills | Status: DC
Start: 2022-02-02 — End: 2022-02-17

## 2022-02-02 MED ORDER — METOPROLOL SUCCINATE ER 100 MG PO TB24: ORAL_TABLET | ORAL | 0 refills | Status: DC

## 2022-02-02 NOTE — Telephone Encounter (Signed)
Eliquis refill was sent on 01/27/2022. Called and confirmed with the pharmacist that they have it. Will not need to resend as pt is pending appt next month.

## 2022-02-02 NOTE — Telephone Encounter (Signed)
Pt was given enough medications until appt in January 2024 with Cardiologist. Confirmation received.

## 2022-02-06 NOTE — Progress Notes (Signed)
Cardiology Office Note Date:  02/10/2022  Patient ID:  [name], [name] 1960/10/12, MRN [id number] PCP:  [name] Downing, MD  Electrophysiologist: Dr. Curt [name]    Chief Complaint:  6 mo  History of Present Illness: [name] [name] is a 62 y.o. male with history of GERD, COPD/asthma/emphysema, chronic respiratory failure (follows with Dr. Halford [name], uses O2 w/exertion), obesity, AFib, OSA (w/CPAP), PE, CKD (II)  HTN and chronic diastolic HF are listed as known hx for him.  He saw Dr. Curt [name] last 09/06/20, he suspected that he had some Afib with symptom of SOB, also reported edema.  Discussed loop implant to monitor AFib burden, but the pt preferred to hold off. Started on lasix and planned to f/u  Had PE may 2023, started on Eliquis, saw Dr. Halford [name] 10/17/21, note mentions that he should f/u with cardiology regarding his AFib and a/c recommendations, as it may impact decisions on his PE management.  TODAY He generally feels poorly, largely attributes it to his AFib, says since he has had Afib he has never felt well again. No energy, always tired, SOB. No near syncope or syncope. No CP His wife mentions pins/needles feet and legs get tired when walking around as well. He reports this is more of a nighttime symptoms in bed with the pins/needles, but does feel like his legs get tired.  He has an O2 tank at home that he uses PRN for SOB as well as inhalers, but denies having any form of portable O2. He is intolerant of his CPAP despite his best efforts, makes him feel like he is being smothered  Reports good medication compliance, no bleeding or signs of bleeding  AFib/AAD hx Diagnosis goes back to 2018 Tikosyn started Jan 2019   Past Medical History:  Diagnosis Date   Chronic diastolic CHF (congestive heart failure) (HCC)    COPD (chronic obstructive pulmonary disease) ([place])    Hypertension    Obesity (BMI 30-39.9) 12/15/2016   OSA (obstructive sleep apnea)    Persistent  atrial fibrillation ([place]) 12/15/2016   Visit for monitoring Tikosyn therapy     Past Surgical History:  Procedure Laterality Date   CARDIAC CATHETERIZATION  02/22/2018   CARDIOVERSION N/A 12/22/2016   Procedure: CARDIOVERSION;  Surgeon: Jacolyn Reedy, MD;  Location: [facility];  Service: Cardiovascular;  Laterality: N/A;   HEMORROIDECTOMY     HERNIA REPAIR     RIGHT/LEFT HEART CATH AND CORONARY ANGIOGRAPHY N/A 02/22/2018   Procedure: RIGHT/LEFT HEART CATH AND CORONARY ANGIOGRAPHY;  Surgeon: Nelva Bush, MD;  Location: [facility];  Service: Cardiovascular;  Laterality: N/A;    Current Outpatient Medications  Medication Sig Dispense Refill   ADVAIR DISKUS 250-50 MCG/ACT AEPB SMARTSIG:1 Puff(s) Via Inhaler Every 12 Hours     apixaban (ELIQUIS) 5 MG TABS tablet TAKE 1 TABLET BY MOUTH 2 TIMES DAILY. 180 tablet 0   dofetilide (TIKOSYN) 500 MCG capsule TAKE 1 CAPSULE BY MOUTH 2 TIMES DAILY. 30 capsule 0   metoprolol succinate (TOPROL-XL) 100 MG 24 hr tablet TAKE 1 TABLET (100 MG TOTAL) BY MOUTH DAILY. 15 tablet 0   albuterol (VENTOLIN HFA) 108 (90 Base) MCG/ACT inhaler Inhale 2 puffs into the lungs every 6 (six) hours as needed for wheezing or shortness of breath. 6.7 g 3   Budeson-Glycopyrrol-Formoterol (BREZTRI AEROSPHERE) 160-9-4.8 MCG/ACT AERO Inhale 2 puffs into the lungs in the morning and at bedtime. 10.7 g 5   Budeson-Glycopyrrol-Formoterol (BREZTRI AEROSPHERE) 160-9-4.8 MCG/ACT AERO Inhale  2 puffs into the lungs in the morning and at bedtime. 5.9 g 0   pantoprazole (PROTONIX) 40 MG tablet Take 1 tablet (40 mg total) by mouth daily. 30 tablet 1   No current facility-administered medications for this visit.    Allergies:   Patient has no known allergies.   Social History:  The patient  reports that he quit smoking about 15 years ago. His smoking use included cigarettes. He has a 30.00 pack-year smoking history. He has been exposed to tobacco smoke. He has never used  smokeless tobacco. He reports current alcohol use of about 4.0 standard drinks of alcohol per week. He reports that he does not use drugs.   Family History:  The patient's family history includes Atrial fibrillation in his brother; CAD in his brother; COPD in his father and mother.  ROS:  Please see the history of present illness.    All other systems are reviewed and otherwise negative.   PHYSICAL EXAM:  VS:  BP 110/68   Pulse 77   Ht 5' 10.5" (1.791 m)   Wt 285 lb 9.6 oz (129.5 kg)   SpO2 (!) 85%   BMI 40.40 kg/m  BMI: Body mass index is 40.4 kg/m. Well nourished, well developed, in no acute distress HEENT: normocephalic, atraumatic Neck: no JVD, carotid bruits or masses Cardiac:  RRR; no significant murmurs, no rubs, or gallops Lungs:  scattered rhonchi b/l that clear with cough and are CTA b/l otherwise, no wheezing or rales Abd: soft, nontender MS: no deformity or  atrophy Ext: 2+ DP pulses b/l, 1+ PT b/l, no cyanosis, brisk cap refill,  no edema Skin: warm and dry, no rash Neuro:  No gross deficits appreciated Psych: euthymic mood, full affect   EKG:  Done today and reviewed by myself shows  SR  77bpm, LAD, QTc 462ms, no significant change from prior   06/26/21: TTE 1. Left ventricular ejection fraction, by estimation, is 60 to 65%. The  left ventricle has normal function. The left ventricle has no regional  wall motion abnormalities. There is mild left ventricular hypertrophy.  Left ventricular diastolic parameters  were normal.   2. Right ventricular systolic function is mildly reduced. The right  ventricular size is mildly enlarged. There is moderately elevated  pulmonary artery systolic pressure. The estimated right ventricular  systolic pressure is 16.1 mmHg. RV free wall  hypokinesis with normal function at apex, consistent with McConnell's sign  as can be seen in acute PE   3. Right atrial size was mildly dilated.   4. The mitral valve is normal in structure.  No evidence of mitral valve  regurgitation.   5. The aortic valve is tricuspid. Aortic valve regurgitation is not  visualized. No aortic stenosis is present.   6. The inferior vena cava is dilated in size with >50% respiratory  variability, suggesting right atrial pressure of 8 mmHg.    02/22/2018: R/LHC Conclusions: Mild, non-obstructive coronary artery disease. Moderately to severely elevated left heart, right heart, and pulmonary artery pressures.  Hemodynamics suggest restrictive physiology. Significant respiratory variation noted in intracardiac pressures, which can be seen with obstructive sleep apnea. Low normal to mildly reduced Fick cardiac output/index.   Recommendations: Medical therapy to prevent progression of coronary artery disease. Diuresis and further evaluation/treatment for suspected obstructive sleep apnea. Consider transthoracic echocardiogram. If symptoms persist, advanced heart failure consultation may be helpful. Overnight extended recovery, as patient does not have anyone to stay with him tonight.  Likely discharge in  the morning.     Recent Labs: 06/25/2021: B Natriuretic Peptide 44.8 06/26/2021: ALT 21 06/28/2021: Hemoglobin 15.4; Platelets 182 06/30/2021: BUN 22; Creatinine, Ser 1.17; Magnesium 2.4; Potassium 4.1; Sodium 133 08/26/2021: Pro B Natriuretic peptide (BNP) 54.0  No results found for requested labs within last 365 days.   CrCl cannot be calculated (Patient's most recent lab result is older than the maximum 21 days allowed.).   Wt Readings from Last 3 Encounters:  02/10/22 285 lb 9.6 oz (129.5 kg)  10/17/21 267 lb 3.2 oz (121.2 kg)  08/26/21 266 lb 3.2 oz (120.7 kg)     Other studies reviewed: Additional studies/records reviewed today include: summarized above  ASSESSMENT AND PLAN:  Longstanding persistent AFib CHA2DS2Vasc is 3, on Eliquis, appropriately dosed. unclear burden by symptoms  Qtc is OK on [employer] today  I do not  suspect Afib as the primary driver for his fatigue, SOB/DOE He is in SR today and felt very SOB walking in. Plan for a 30day monitor   Chronic CHF (diastolic) Chronic respiratory failure COPD/asthma/emphysema OSA Follows with pulmonary service Looks like he is supposed to have O2 w/exertion No clear evidence of volume OL by his exam Will get a BNP, echo  I have urged him (them) to call Dr. Neomia Glass office TODAY to discuss: Portable O2 (in review of his last note, appears that he is to be using O2 with exertion) CPAP options And and to make an appoint there as well. His O2 sat after ambulating in was 85%, after rest was 92-93% This appears to be his known baseline  Obesity also likely plays a role  HTN BP looks good   Disposition: F/u with [place] in 73mo, sooner if needed    Current medicines are reviewed at length with the patient today.  The patient did not have any concerns regarding medicines.  Norma Fredrickson, PA-C 02/10/2022 11:16 AM     [facility] [address] [place] [address] [phone] (office)  [phone] (fax)

## 2022-02-10 ENCOUNTER — Encounter: Payer: Self-pay | Admitting: Physician Assistant

## 2022-02-10 ENCOUNTER — Other Ambulatory Visit: Payer: Self-pay | Admitting: Physician Assistant

## 2022-02-10 ENCOUNTER — Telehealth: Payer: Self-pay

## 2022-02-10 ENCOUNTER — Ambulatory Visit: Payer: Medicaid Other | Attending: Cardiology | Admitting: Physician Assistant

## 2022-02-10 VITALS — BP 110/68 | HR 77 | Ht 70.5 in | Wt 285.6 lb

## 2022-02-10 DIAGNOSIS — R0600 Dyspnea, unspecified: Secondary | ICD-10-CM

## 2022-02-10 DIAGNOSIS — I4811 Longstanding persistent atrial fibrillation: Secondary | ICD-10-CM | POA: Diagnosis not present

## 2022-02-10 DIAGNOSIS — I739 Peripheral vascular disease, unspecified: Secondary | ICD-10-CM

## 2022-02-10 DIAGNOSIS — J9611 Chronic respiratory failure with hypoxia: Secondary | ICD-10-CM

## 2022-02-10 DIAGNOSIS — I10 Essential (primary) hypertension: Secondary | ICD-10-CM

## 2022-02-10 NOTE — Telephone Encounter (Signed)
-----   Message from Chesley Mires, MD sent at 02/10/2022 12:11 PM EST ----- Sohan Potvin,  Can you schedule Mr. Rauch for a follow up with me or one of the NPs to discuss home oxygen therapy.    Thanks.  V  ----- Message ----- From: Baldwin Jamaica, PA-C Sent: 02/10/2022  11:38 AM EST To: Will Meredith Leeds, MD; Chesley Mires, MD

## 2022-02-10 NOTE — Patient Instructions (Addendum)
Medication Instructions:   Your physician recommends that you continue on your current medications as directed. Please refer to the Current Medication list given to you today.  *If you need a refill on your cardiac medications before your next appointment, please call your pharmacy*   Lab Work:  BMET  [place]   If you have labs (blood work) drawn today and your tests are completely normal, you will receive your results only by: [place] (if you have MyChart) OR A paper copy in the mail If you have any lab test that is abnormal or we need to change your treatment, we will call you to review the results.   Testing/Procedures: Your physician has requested that you have an echocardiogram. Echocardiography is a painless test that uses sound waves to create images of your heart. It provides your doctor with information about the size and shape of your heart and how well your heart's chambers and valves are working. This procedure takes approximately one hour. There are no restrictions for this procedure. Please do NOT wear cologne, perfume, aftershave, or lotions (deodorant is allowed). Please arrive 15 minutes prior to your appointment time.   Your physician has requested that you have an ankle brachial index (ABI). During this test an ultrasound and blood pressure cuff are used to evaluate the arteries that supply the arms and legs with blood. Allow thirty minutes for this exam. There are no restrictions or special instructions.   Your physician has recommended that you wear an event monitor. Event monitors are medical devices that record the heart's electrical activity. Doctors most often [place] these monitors to diagnose arrhythmias. Arrhythmias are problems with the speed or rhythm of the heartbeat. The monitor is a small, portable device. You can wear one while you do your normal daily activities. This is usually used to diagnose what is causing palpitations/syncope (passing  out).    Follow-Up: At [facility], you and your health needs are our priority.  As part of our continuing mission to provide you with exceptional heart care, we have created designated Provider Care Teams.  These Care Teams include your primary Cardiologist (physician) and Advanced Practice Providers (APPs -  Physician Assistants and Nurse Practitioners) who all work together to provide you with the care you need, when you need it.  We recommend signing up for the patient portal called "MyChart".  Sign up information is provided on this After Visit Summary.  MyChart is used to connect with patients for Virtual Visits (Telemedicine).  Patients are able to view lab/test results, encounter notes, upcoming appointments, etc.  Non-urgent messages can be sent to your provider as well.   To learn more about what you can do with MyChart, go to [email or web address].    Your next appointment:   2 month(s)  The format for your next appointment:   In Person  Provider:   You may see Will Meredith Leeds, MD or one of the following Advanced Practice Providers on your designated Care Team:   Tommye Standard, [place] Legrand Como "Oda Kilts, [place]   [employer] Cardiac Event Monitor Instructions Your physician has requested you wear your cardiac event monitor for __30___ days, (1-30). [employer] may call or text to confirm a shipping address. The monitor will be sent to a land address via UPS. [employer] will not ship a monitor to a PO BOX. It typically takes 3-5 days to receive your monitor after it has been enrolled. [employer] will assist with USPS tracking if your  package is delayed. The telephone number for [employer] is [phone]. Once you have received your monitor, please review the enclosed instructions. Instruction tutorials can also be viewed under help and settings on the enclosed cell phone. Your monitor has already been registered assigning a specific monitor serial # to  you.  Applying the monitor Remove cell phone from case and turn it on. The cell phone works as IT consultant and needs to be within [employer] of you at all times. The cell phone will need to be charged on a daily basis. We recommend you plug the cell phone into the enclosed charger at your bedside table every night.  Monitor batteries: You will receive two monitor batteries labelled #1 and #2. These are your recorders. Plug battery #2 onto the second connection on the enclosed charger. Keep one battery on the charger at all times. This will keep the monitor battery deactivated. It will also keep it fully charged for when you need to switch your monitor batteries. A small light will be blinking on the battery emblem when it is charging. The light on the battery emblem will remain on when the battery is fully charged.  Open package of a Monitor strip. Insert battery #1 into black hood on strip and gently squeeze monitor battery onto connection as indicated in instruction booklet. Set aside while preparing skin.  Choose location for your strip, vertical or horizontal, as indicated in the instruction booklet. Shave to remove all hair from location. There cannot be any lotions, oils, powders, or colognes on skin where monitor is to be applied. Wipe skin clean with enclosed Saline wipe. Dry skin completely.  Peel paper labeled #1 off the back of the Monitor strip exposing the adhesive. Place the monitor on the chest in the vertical or horizontal position shown in the instruction booklet. One arrow on the monitor strip must be pointing upward. Carefully remove paper labeled #2, attaching remainder of strip to your skin. Try not to create any folds or wrinkles in the strip as you apply it.  Firmly press and release the circle in the center of the monitor battery. You will hear a small beep. This is turning the monitor battery on. The heart emblem on the monitor battery will light up every 5  seconds if the monitor battery in turned on and connected to the patient securely. Do not push and hold the circle down as this turns the monitor battery off. The cell phone will locate the monitor battery. A screen will appear on the cell phone checking the connection of your monitor strip. This may read poor connection initially but change to good connection within the next minute. Once your monitor accepts the connection you will hear a series of 3 beeps followed by a climbing crescendo of beeps. A screen will appear on the cell phone showing the two monitor strip placement options. Touch the picture that demonstrates where you applied the monitor strip.  Your monitor strip and battery are waterproof. You are able to shower, bathe, or swim with the monitor on. They just ask you do not submerge deeper than 3 feet underwater. We recommend removing the monitor if you are swimming in a lake, river, or ocean.  Your monitor battery will need to be switched to a fully charged monitor battery approximately once a week. The cell phone will alert you of an action which needs to be made.  On the cell phone, tap for details to reveal connection status, monitor battery status,  and cell phone battery status. The green dots indicates your monitor is in good status. A red dot indicates there is something that needs your attention.  To record a symptom, click the circle on the monitor battery. In 30-60 seconds a list of symptoms will appear on the cell phone. Select your symptom and tap save. Your monitor will record a sustained or significant arrhythmia regardless of you clicking the button. Some patients do not feel the heart rhythm irregularities. [employer] will notify us of any serious or critical events.  Refer to instruction booklet for instructions on switching batteries, changing strips, the Do not disturb or Pause features, or any additional questions.  Call [employer] at [phone], to  confirm your monitor is transmitting and record your baseline. They will answer any questions you may have regarding the monitor instructions at that time.  Returning the monitor to [place] all equipment back into blue box. Peel off strip of paper to expose adhesive and close box securely. There is a prepaid UPS shipping label on this box. Drop in a UPS drop box, or at a UPS facility like [employer]. You may also contact [employer] to arrange UPS to pick up monitor package at your home.   Important Information About [place]

## 2022-02-10 NOTE — Telephone Encounter (Signed)
ATC patient and line rang for approx one minute, then "call could not be completed at this time" message [phone]. Will ATC patient again at another time.

## 2022-02-11 LAB — CBC
Hematocrit: 44.9 % (ref 37.5–51.0)
Hemoglobin: 14.9 g/dL (ref 13.0–17.7)
MCH: 31.6 pg (ref 26.6–33.0)
MCHC: 33.2 g/dL (ref 31.5–35.7)
MCV: 95 fL (ref 79–97)
Platelets: 247 10*3/uL (ref 150–450)
RBC: 4.72 x10E6/uL (ref 4.14–5.80)
RDW: 12.9 % (ref 11.6–15.4)
WBC: 6.6 10*3/uL (ref 3.4–10.8)

## 2022-02-11 LAB — BASIC METABOLIC PANEL
BUN/Creatinine Ratio: 12 (ref 9–20)
BUN: 14 mg/dL (ref 6–24)
CO2: 26 mmol/L (ref 20–29)
Calcium: 8.8 mg/dL (ref 8.7–10.2)
Chloride: 95 mmol/L — ABNORMAL LOW (ref 96–106)
Creatinine, Ser: 1.21 mg/dL (ref 0.76–1.27)
Glucose: 89 mg/dL (ref 70–99)
Potassium: 4.3 mmol/L (ref 3.5–5.2)
Sodium: 137 mmol/L (ref 134–144)
eGFR: 69 mL/min/{1.73_m2} (ref >59)

## 2022-02-11 LAB — PRO B NATRIURETIC PEPTIDE: NT-Pro BNP: 504 pg/mL — ABNORMAL HIGH (ref 0–210)

## 2022-02-11 LAB — MAGNESIUM: Magnesium: 1.9 mg/dL (ref 1.6–2.3)

## 2022-02-17 ENCOUNTER — Ambulatory Visit (HOSPITAL_COMMUNITY): Payer: Medicaid Other | Attending: Cardiology

## 2022-02-17 ENCOUNTER — Other Ambulatory Visit: Payer: Self-pay | Admitting: Cardiology

## 2022-02-23 NOTE — Telephone Encounter (Signed)
ATC patient again and received message:"vm box has not been set up yet"  Will mail a letter to patient asking him to call back to schedule an office visit    Letter printed and mailed on 02/23/2022

## 2022-02-24 ENCOUNTER — Other Ambulatory Visit: Payer: Self-pay | Admitting: Cardiology

## 2022-02-27 ENCOUNTER — Ambulatory Visit (HOSPITAL_COMMUNITY): Payer: Medicaid Other

## 2022-03-02 ENCOUNTER — Other Ambulatory Visit: Payer: Self-pay | Admitting: Physician Assistant

## 2022-03-02 DIAGNOSIS — I739 Peripheral vascular disease, unspecified: Secondary | ICD-10-CM

## 2022-03-06 ENCOUNTER — Ambulatory Visit (HOSPITAL_COMMUNITY)
Admission: RE | Admit: 2022-03-06 | Discharge: 2022-03-06 | Disposition: A | Discharge status: Home / Self Care | Payer: Medicaid Other | Source: Ambulatory Visit | Attending: Cardiovascular Disease | Admitting: Cardiovascular Disease

## 2022-03-06 DIAGNOSIS — I739 Peripheral vascular disease, unspecified: Secondary | ICD-10-CM

## 2022-03-06 LAB — VAS US LOWER EXT ART SEG MULTI (SEGMENTALS & LE RAYNAUDS)
Left ABI: 1.15
Right ABI: 1.14

## 2022-03-08 ENCOUNTER — Ambulatory Visit (INDEPENDENT_AMBULATORY_CARE_PROVIDER_SITE_OTHER): Payer: Medicaid Other

## 2022-03-08 ENCOUNTER — Ambulatory Visit (HOSPITAL_COMMUNITY): Payer: Medicaid Other | Attending: Physician Assistant

## 2022-03-08 DIAGNOSIS — R0600 Dyspnea, unspecified: Secondary | ICD-10-CM | POA: Insufficient documentation

## 2022-03-08 DIAGNOSIS — I361 Nonrheumatic tricuspid (valve) insufficiency: Secondary | ICD-10-CM

## 2022-03-08 DIAGNOSIS — I517 Cardiomegaly: Secondary | ICD-10-CM

## 2022-03-08 DIAGNOSIS — I5030 Unspecified diastolic (congestive) heart failure: Secondary | ICD-10-CM

## 2022-03-08 DIAGNOSIS — I4811 Longstanding persistent atrial fibrillation: Secondary | ICD-10-CM

## 2022-03-08 LAB — ECHOCARDIOGRAM COMPLETE
Area-P 1/2: 4.06 cm2
S' Lateral: 3.4 cm

## 2022-03-08 NOTE — Progress Notes (Unsigned)
[employer] [id number] mailed to patient 02/10/22 and applied in office 03/08/22.  Dr. Curt Bears to read.  Cell phone case clip broken, [employer] notified to send patient new one.

## 2022-03-10 NOTE — Progress Notes (Signed)
Spoke with patient aware of results and verbalized understanding, Patient will reach out to Pulmonologist to follow up on appointment

## 2022-03-14 ENCOUNTER — Telehealth: Payer: Self-pay

## 2022-03-14 ENCOUNTER — Telehealth: Payer: Self-pay | Admitting: Cardiology

## 2022-03-14 NOTE — Telephone Encounter (Signed)
[name] [name] with [employer] to notify of afib. He states the Afib rvr sustained throughout the 1 minute recording. Rate was 120-130 beats per minute with some leed loss.

## 2022-03-14 NOTE — Telephone Encounter (Signed)
   Cardiac Monitor Alert  Date of alert:  03/14/2022   Patient Name: [name] [name]  DOB: 08/15/1960  MRN: [id number]   [place] Cardiologist: Will Meredith Leeds, MD  [place] EP:  Will Meredith Leeds, MD    Monitor Information: Cardiac Event Monitor [[employer]]  Reason:  Persistent Afib Ordering provider:  [place]   Alert Atrial Fibrillation/Flutter This is the 1st alert for this rhythm.  The patient has a hx of Atrial Fibrillation/Flutter.    Anticoagulation medication as of 03/14/2022           apixaban (ELIQUIS) 5 MG TABS tablet TAKE 1 TABLET BY MOUTH 2 TIMES DAILY.       Next Cardiology Appointment   Date:  04/19/2022  Provider:  Tommye Standard, PA-C  The patient was contacted today.  He is symptomatic.  He reports the following symptoms:  feeling palpitations but denies current CP, new SOB or dizziness. Arrhythmia, symptoms and history reviewed with Dr Myles Gip.  Plan:  Continue monitoring and share report with Dr Curt Bears for further advisement.  Eleanora Neighbor, RN made aware of recommendation and monitor strip given to her for Dr Curt Bears to review.    Thora Lance, RN  03/14/2022 11:31 AM

## 2022-03-14 NOTE — Telephone Encounter (Signed)
Strip being faxed. Will route to nurse.

## 2022-04-17 NOTE — Progress Notes (Deleted)
   Cardiology Office Note Date:  04/17/2022  Patient ID:  [name] [name], DOB 12/20/1960, MRN [id number] PCP:  Elkins, Wilson Oliver, MD  Electrophysiologist: Dr. Camnitz    Chief Complaint:  *** 2 mo  History of Present Illness: [name] is a 62 y.o. male with history of GERD, COPD/asthma/emphysema, chronic respiratory failure (follows with Dr. Sood, uses O2 w/exertion), obesity, AFib, OSA (w/CPAP), PE, CKD (II)  HTN and chronic diastolic HF are listed as known hx for him.  He saw Dr. Camnitz last 09/06/20, he suspected that he had some Afib with symptom of SOB, also reported edema.  Discussed loop implant to monitor AFib burden, but the pt preferred to hold off. Started on lasix and planned to f/u  Had PE may 2023, started on Eliquis, saw Dr. Sood 10/17/21, note mentions that he should f/u with cardiology regarding his AFib and a/c recommendations, as it may impact decisions on his PE management.  I saw him 02/10/22 He generally feels poorly, largely attributes it to his AFib, says since he has had Afib he has never felt well again. No energy, always tired, SOB. No near syncope or syncope. No CP His wife mentions pins/needles feet and legs get tired when walking around as well. He reports this is more of a nighttime symptoms in bed with the pins/needles, but does feel like his legs get tired. He has an O2 tank at home that he uses PRN for SOB as well as inhalers, but denies having any form of portable O2. He is intolerant of his CPAP despite his best efforts, makes him feel like he is being smothered Reports good medication compliance, no bleeding or signs of bleeding He was feeling poorly and in SR, ambulatory O2 sat 85% > recovered to 92-93% with rest AFib not felt to be primary driver for his symptoms, urged f/u with pulmonary team,  Planned for labs, echo, monitor  Monitor with 54% AF, rates appeared not particularly controlled, 90's-130's probably Echo with LVEF 60-65%,  no WMA  Looks like pulm called to make him an appt, but unable to reach him  *** Tikosyn EKG, med list *** CPAP? *** TTE w/normal pulm/RH pressure *** needs better pulm status, sats?? *** BNP was 504, ?? Lasix  AFib/AAD hx Diagnosis goes back to 2018 Tikosyn started Jan 2019   Past Medical History:  Diagnosis Date   Chronic diastolic CHF (congestive heart failure) (HCC)    COPD (chronic obstructive pulmonary disease) (HCC)    Hypertension    Obesity (BMI 30-39.9) 12/15/2016   OSA (obstructive sleep apnea)    Persistent atrial fibrillation (HCC) 12/15/2016   Visit for monitoring Tikosyn therapy     Past Surgical History:  Procedure Laterality Date   CARDIAC CATHETERIZATION  02/22/2018   CARDIOVERSION N/A 12/22/2016   Procedure: CARDIOVERSION;  Surgeon: Tilley, William S, MD;  Location: [facility];  Service: Cardiovascular;  Laterality: N/A;   HEMORROIDECTOMY     HERNIA REPAIR     RIGHT/LEFT HEART CATH AND CORONARY ANGIOGRAPHY N/A 02/22/2018   Procedure: RIGHT/LEFT HEART CATH AND CORONARY ANGIOGRAPHY;  Surgeon: End, Christopher, MD;  Location: [facility];  Service: Cardiovascular;  Laterality: N/A;    Current Outpatient Medications  Medication Sig Dispense Refill   ADVAIR DISKUS 250-50 MCG/ACT AEPB SMARTSIG:1 Puff(s) Via Inhaler Every 12 Hours     albuterol (VENTOLIN HFA) 108 (90 Base) MCG/ACT inhaler Inhale 2 puffs into the lungs every 6 (six) hours as needed for wheezing   or shortness of breath. 6.7 g 3   apixaban (ELIQUIS) 5 MG TABS tablet TAKE 1 TABLET BY MOUTH 2 TIMES DAILY. 180 tablet 0   Budeson-Glycopyrrol-Formoterol (BREZTRI AEROSPHERE) 160-9-4.8 MCG/ACT AERO Inhale 2 puffs into the lungs in the morning and at bedtime. 10.7 g 5   Budeson-Glycopyrrol-Formoterol (BREZTRI AEROSPHERE) 160-9-4.8 MCG/ACT AERO Inhale 2 puffs into the lungs in the morning and at bedtime. 5.9 g 0   dofetilide (TIKOSYN) 500 MCG capsule TAKE 1 CAPSULE BY MOUTH 2 TIMES DAILY. 180 capsule  3   metoprolol succinate (TOPROL-XL) 100 MG 24 hr tablet TAKE 1 TABLET (100 MG TOTAL) BY MOUTH DAILY. 90 tablet 3   pantoprazole (PROTONIX) 40 MG tablet Take 1 tablet (40 mg total) by mouth daily. 30 tablet 1   No current facility-administered medications for this visit.    Allergies:   Patient has no known allergies.   Social History:  The patient  reports that he quit smoking about 15 years ago. His smoking use included cigarettes. He has a 30.00 pack-year smoking history. He has been exposed to tobacco smoke. He has never used smokeless tobacco. He reports current alcohol use of about 4.0 standard drinks of alcohol per week. He reports that he does not use drugs.   Family History:  The patient's family history includes Atrial fibrillation in his brother; CAD in his brother; COPD in his father and mother.  ROS:  Please see the history of present illness.    All other systems are reviewed and otherwise negative.   PHYSICAL EXAM:  VS:  There were no vitals taken for this visit. BMI: There is no height or weight on file to calculate BMI. Well nourished, well developed, in no acute distress HEENT: normocephalic, atraumatic Neck: no JVD, carotid bruits or masses Cardiac:  RRR; no significant murmurs, no rubs, or gallops Lungs:  scattered rhonchi b/l that clear with cough and are CTA b/l otherwise, no wheezing or rales Abd: soft, nontender MS: no deformity or  atrophy Ext: 2+ DP pulses b/l, 1+ PT b/l, no cyanosis, brisk cap refill,  no edema Skin: warm and dry, no rash Neuro:  No gross deficits appreciated Psych: euthymic mood, full affect   EKG:  not done today   Jan 2024, monitor 53% atrial fibrillation burden No triggered episodes recorded Less than 1% ventricular ectopy   03/08/22: TTE 1. Left ventricular ejection fraction, by estimation, is 60 to 65%. The  left ventricle has normal function. The left ventricle has no regional  wall motion abnormalities. Left ventricular  diastolic parameters are  consistent with Grade I diastolic  dysfunction (impaired relaxation). The average left ventricular global  longitudinal strain is -17.9 %. The global longitudinal strain is normal.   2. Right ventricular systolic function is normal. The right ventricular  size is mildly enlarged. There is normal pulmonary artery systolic  pressure.   3. Right atrial size was mildly dilated.   4. The mitral valve is normal in structure. No evidence of mitral valve  regurgitation. No evidence of mitral stenosis.   5. The aortic valve is normal in structure. Aortic valve regurgitation is  not visualized. No aortic stenosis is present.   6. The inferior vena cava is normal in size with greater than 50%  respiratory variability, suggesting right atrial pressure of 3 mmHg.   Comparison(s): No significant change from prior study. Prior images  reviewed side by side.    06/26/21: TTE 1. Left ventricular ejection fraction, by estimation, is 60   to 65%. The  left ventricle has normal function. The left ventricle has no regional  wall motion abnormalities. There is mild left ventricular hypertrophy.  Left ventricular diastolic parameters  were normal.   2. Right ventricular systolic function is mildly reduced. The right  ventricular size is mildly enlarged. There is moderately elevated  pulmonary artery systolic pressure. The estimated right ventricular  systolic pressure is 46.7 mmHg. RV free wall  hypokinesis with normal function at apex, consistent with McConnell's sign  as can be seen in acute PE   3. Right atrial size was mildly dilated.   4. The mitral valve is normal in structure. No evidence of mitral valve  regurgitation.   5. The aortic valve is tricuspid. Aortic valve regurgitation is not  visualized. No aortic stenosis is present.   6. The inferior vena cava is dilated in size with >50% respiratory  variability, suggesting right atrial pressure of 8 mmHg.    02/22/2018:  R/LHC Conclusions: Mild, non-obstructive coronary artery disease. Moderately to severely elevated left heart, right heart, and pulmonary artery pressures.  Hemodynamics suggest restrictive physiology. Significant respiratory variation noted in intracardiac pressures, which can be seen with obstructive sleep apnea. Low normal to mildly reduced Fick cardiac output/index.   Recommendations: Medical therapy to prevent progression of coronary artery disease. Diuresis and further evaluation/treatment for suspected obstructive sleep apnea. Consider transthoracic echocardiogram. If symptoms persist, advanced heart failure consultation may be helpful. Overnight extended recovery, as patient does not have anyone to stay with him tonight.  Likely discharge in the morning.     Recent Labs: 06/25/2021: B Natriuretic Peptide 44.8 06/26/2021: ALT 21 02/10/2022: BUN 14; Creatinine, Ser 1.21; Hemoglobin 14.9; Magnesium 1.9; NT-Pro BNP 504; Platelets 247; Potassium 4.3; Sodium 137  No results found for requested labs within last 365 days.   CrCl cannot be calculated (Patient's most recent lab result is older than the maximum 21 days allowed.).   Wt Readings from Last 3 Encounters:  02/10/22 285 lb 9.6 oz (129.5 kg)  10/17/21 267 lb 3.2 oz (121.2 kg)  08/26/21 266 lb 3.2 oz (120.7 kg)     Other studies reviewed: Additional studies/records reviewed today include: summarized above  ASSESSMENT AND PLAN:  Longstanding persistent AFib CHA2DS2Vasc is 3, on Eliquis, appropriately dosed. *** unclear burden by symptoms  *** Qtc is OK on Tikosyn ***  Chronic CHF (diastolic) Chronic respiratory failure COPD/asthma/emphysema OSA ***  HTN *** BP looks good   Disposition: ***     Current medicines are reviewed at length with the patient today.  The patient did not have any concerns regarding medicines.  Signed, Ishitha Roper, PA-C 04/17/2022 2:25 PM     [facility] [address] [place] [address] [phone] (office)  [phone] (fax)  

## 2022-04-19 ENCOUNTER — Ambulatory Visit: Payer: Medicaid Other | Admitting: Physician Assistant

## 2022-04-27 ENCOUNTER — Other Ambulatory Visit: Payer: Self-pay | Admitting: Cardiology

## 2022-04-27 DIAGNOSIS — I4819 Other persistent atrial fibrillation: Secondary | ICD-10-CM

## 2022-04-27 NOTE — Telephone Encounter (Signed)
Pt last saw Tommye Standard, [place] on 02/10/22, last labs 02/10/22 Creat 1.21, age 62, weight 129.5kg, based on specified criteria pt is on appropriate dosage of Eliquis 5mg  BID for afib.  Will refill rx.

## 2022-04-30 NOTE — Progress Notes (Unsigned)
Cardiology Office Note Date:  04/17/2022  Patient ID:  [name] [name], [name] [name] [birth year], MRN [id number] PCP:  Leonard Downing, MD  Electrophysiologist: Dr. Curt Bears    Chief Complaint:  *** 2 mo  History of Present Illness: [name] [name] is a 62 y.o. male with history of GERD, COPD/asthma/emphysema, chronic respiratory failure (follows with Dr. Halford Chessman, uses O2 w/exertion), obesity, AFib, OSA (w/CPAP), PE, CKD (II)  HTN and chronic diastolic HF are listed as known hx for him.  He saw Dr. Curt Bears last 09/06/20, he suspected that he had some Afib with symptom of SOB, also reported edema.  Discussed loop implant to monitor AFib burden, but the pt preferred to hold off. Started on lasix and planned to f/u  Had PE may 2023, started on Eliquis, saw Dr. Halford Chessman 10/17/21, note mentions that he should f/u with cardiology regarding his AFib and a/c recommendations, as it may impact decisions on his PE management.  I saw him 02/10/22 He generally feels poorly, largely attributes it to his AFib, says since he has had Afib he has never felt well again. No energy, always tired, SOB. No near syncope or syncope. No CP His wife mentions pins/needles feet and legs get tired when walking around as well. He reports this is more of a nighttime symptoms in bed with the pins/needles, but does feel like his legs get tired. He has an O2 tank at home that he uses PRN for SOB as well as inhalers, but denies having any form of portable O2. He is intolerant of his CPAP despite his best efforts, makes him feel like he is being smothered Reports good medication compliance, no bleeding or signs of bleeding He was feeling poorly and in SR, ambulatory O2 sat 85% > recovered to 92-93% with rest AFib not felt to be primary driver for his symptoms, urged f/u with pulmonary team,  Planned for labs, echo, monitor  Monitor with 54% AF, rates appeared not particularly controlled, 90's-130's probably Echo with LVEF 60-65%,  no WMA  Looks like pulm called to make him an appt, but unable to reach him  *** Tikosyn EKG, med list *** CPAP? *** TTE w/normal pulm/RH pressure *** needs better pulm status, sats?? *** BNP was 504, ?? Lasix  AFib/AAD hx Diagnosis goes back to 2018 Tikosyn started Jan 2019   Past Medical History:  Diagnosis Date   Chronic diastolic CHF (congestive heart failure) (HCC)    COPD (chronic obstructive pulmonary disease) (HCC)    Hypertension    Obesity (BMI 30-39.9) 12/15/2016   OSA (obstructive sleep apnea)    Persistent atrial fibrillation ([place]) 12/15/2016   Visit for monitoring Tikosyn therapy     Past Surgical History:  Procedure Laterality Date   CARDIAC CATHETERIZATION  02/22/2018   CARDIOVERSION N/A 12/22/2016   Procedure: CARDIOVERSION;  Surgeon: Jacolyn Reedy, MD;  Location: [facility];  Service: Cardiovascular;  Laterality: N/A;   HEMORROIDECTOMY     HERNIA REPAIR     RIGHT/LEFT HEART CATH AND CORONARY ANGIOGRAPHY N/A 02/22/2018   Procedure: RIGHT/LEFT HEART CATH AND CORONARY ANGIOGRAPHY;  Surgeon: Nelva Bush, MD;  Location: [facility] CV LAB;  Service: Cardiovascular;  Laterality: N/A;    Current Outpatient Medications  Medication Sig Dispense Refill   ADVAIR DISKUS 250-50 MCG/ACT AEPB SMARTSIG:1 Puff(s) Via Inhaler Every 12 Hours     albuterol (VENTOLIN HFA) 108 (90 Base) MCG/ACT inhaler Inhale 2 puffs into the lungs every 6 (six) hours as needed for wheezing  or shortness of breath. 6.7 g 3   apixaban (ELIQUIS) 5 MG TABS tablet TAKE 1 TABLET BY MOUTH 2 TIMES DAILY. 180 tablet 0   Budeson-Glycopyrrol-Formoterol (BREZTRI AEROSPHERE) 160-9-4.8 MCG/ACT AERO Inhale 2 puffs into the lungs in the morning and at bedtime. 10.7 g 5   Budeson-Glycopyrrol-Formoterol (BREZTRI AEROSPHERE) 160-9-4.8 MCG/ACT AERO Inhale 2 puffs into the lungs in the morning and at bedtime. 5.9 g 0   dofetilide (TIKOSYN) 500 MCG capsule TAKE 1 CAPSULE BY MOUTH 2 TIMES DAILY. 180 capsule  3   metoprolol succinate (TOPROL-XL) 100 MG 24 hr tablet TAKE 1 TABLET (100 MG TOTAL) BY MOUTH DAILY. 90 tablet 3   pantoprazole (PROTONIX) 40 MG tablet Take 1 tablet (40 mg total) by mouth daily. 30 tablet 1   No current facility-administered medications for this visit.    Allergies:   Patient has no known allergies.   Social History:  The patient  reports that he quit smoking about 15 years ago. His smoking use included cigarettes. He has a 30.00 pack-year smoking history. He has been exposed to tobacco smoke. He has never used smokeless tobacco. He reports current alcohol use of about 4.0 standard drinks of alcohol per week. He reports that he does not use drugs.   Family History:  The patient's family history includes Atrial fibrillation in his brother; CAD in his brother; COPD in his father and mother.  ROS:  Please see the history of present illness.    All other systems are reviewed and otherwise negative.   PHYSICAL EXAM:  VS:  There were no vitals taken for this visit. BMI: There is no height or weight on file to calculate BMI. Well nourished, well developed, in no acute distress HEENT: normocephalic, atraumatic Neck: no JVD, carotid bruits or masses Cardiac:  RRR; no significant murmurs, no rubs, or gallops Lungs:  scattered rhonchi b/l that clear with cough and are CTA b/l otherwise, no wheezing or rales Abd: soft, nontender MS: no deformity or  atrophy Ext: 2+ DP pulses b/l, 1+ PT b/l, no cyanosis, brisk cap refill,  no edema Skin: warm and dry, no rash Neuro:  No gross deficits appreciated Psych: euthymic mood, full affect   EKG:  not done today   Jan 2024, monitor 53% atrial fibrillation burden No triggered episodes recorded Less than 1% ventricular ectopy   03/08/22: TTE 1. Left ventricular ejection fraction, by estimation, is 60 to 65%. The  left ventricle has normal function. The left ventricle has no regional  wall motion abnormalities. Left ventricular  diastolic parameters are  consistent with Grade I diastolic  dysfunction (impaired relaxation). The average left ventricular global  longitudinal strain is -17.9 %. The global longitudinal strain is normal.   2. Right ventricular systolic function is normal. The right ventricular  size is mildly enlarged. There is normal pulmonary artery systolic  pressure.   3. Right atrial size was mildly dilated.   4. The mitral valve is normal in structure. No evidence of mitral valve  regurgitation. No evidence of mitral stenosis.   5. The aortic valve is normal in structure. Aortic valve regurgitation is  not visualized. No aortic stenosis is present.   6. The inferior vena cava is normal in size with greater than 50%  respiratory variability, suggesting right atrial pressure of 3 mmHg.   Comparison(s): No significant change from prior study. Prior images  reviewed side by side.    06/26/21: TTE 1. Left ventricular ejection fraction, by estimation, is 60  to 65%. The  left ventricle has normal function. The left ventricle has no regional  wall motion abnormalities. There is mild left ventricular hypertrophy.  Left ventricular diastolic parameters  were normal.   2. Right ventricular systolic function is mildly reduced. The right  ventricular size is mildly enlarged. There is moderately elevated  pulmonary artery systolic pressure. The estimated right ventricular  systolic pressure is 123456 mmHg. RV free wall  hypokinesis with normal function at apex, consistent with McConnell's sign  as can be seen in acute PE   3. Right atrial size was mildly dilated.   4. The mitral valve is normal in structure. No evidence of mitral valve  regurgitation.   5. The aortic valve is tricuspid. Aortic valve regurgitation is not  visualized. No aortic stenosis is present.   6. The inferior vena cava is dilated in size with >50% respiratory  variability, suggesting right atrial pressure of 8 mmHg.    02/22/2018:  R/LHC Conclusions: Mild, non-obstructive coronary artery disease. Moderately to severely elevated left heart, right heart, and pulmonary artery pressures.  Hemodynamics suggest restrictive physiology. Significant respiratory variation noted in intracardiac pressures, which can be seen with obstructive sleep apnea. Low normal to mildly reduced Fick cardiac output/index.   Recommendations: Medical therapy to prevent progression of coronary artery disease. Diuresis and further evaluation/treatment for suspected obstructive sleep apnea. Consider transthoracic echocardiogram. If symptoms persist, advanced heart failure consultation may be helpful. Overnight extended recovery, as patient does not have anyone to stay with him tonight.  Likely discharge in the morning.     Recent Labs: 06/25/2021: B Natriuretic Peptide 44.8 06/26/2021: ALT 21 02/10/2022: BUN 14; Creatinine, Ser 1.21; Hemoglobin 14.9; Magnesium 1.9; NT-Pro BNP 504; Platelets 247; Potassium 4.3; Sodium 137  No results found for requested labs within last 365 days.   CrCl cannot be calculated (Patient's most recent lab result is older than the maximum 21 days allowed.).   Wt Readings from Last 3 Encounters:  02/10/22 285 lb 9.6 oz (129.5 kg)  10/17/21 267 lb 3.2 oz (121.2 kg)  08/26/21 266 lb 3.2 oz (120.7 kg)     Other studies reviewed: Additional studies/records reviewed today include: summarized above  ASSESSMENT AND PLAN:  Longstanding persistent AFib CHA2DS2Vasc is 3, on Eliquis, appropriately dosed. *** unclear burden by symptoms  *** Qtc is OK on Tikosyn ***  Chronic CHF (diastolic) Chronic respiratory failure COPD/asthma/emphysema OSA ***  HTN *** BP looks good   Disposition: ***     Current medicines are reviewed at length with the patient today.  The patient did not have any concerns regarding medicines.  Venetia Night, PA-C 04/17/2022 2:25 PM     [place] [address] [phone] (office)  [phone] (fax)

## 2022-05-03 ENCOUNTER — Encounter: Payer: Self-pay | Admitting: Physician Assistant

## 2022-05-03 ENCOUNTER — Ambulatory Visit: Payer: Medicaid Other | Attending: Physician Assistant | Admitting: Physician Assistant

## 2022-05-03 ENCOUNTER — Other Ambulatory Visit (HOSPITAL_BASED_OUTPATIENT_CLINIC_OR_DEPARTMENT_OTHER): Payer: Self-pay | Admitting: Internal Medicine

## 2022-05-03 VITALS — BP 130/68 | HR 86 | Ht 69.0 in | Wt 301.8 lb

## 2022-05-03 DIAGNOSIS — I4811 Longstanding persistent atrial fibrillation: Secondary | ICD-10-CM

## 2022-05-03 DIAGNOSIS — I5032 Chronic diastolic (congestive) heart failure: Secondary | ICD-10-CM | POA: Diagnosis not present

## 2022-05-03 DIAGNOSIS — D6869 Other thrombophilia: Secondary | ICD-10-CM | POA: Diagnosis not present

## 2022-05-03 NOTE — Patient Instructions (Signed)
Medication Instructions:   Your physician recommends that you continue on your current medications as directed. Please refer to the Current Medication list given to you today.  *If you need a refill on your cardiac medications before your next appointment, please call your pharmacy*   Lab Work: [place]   If you have labs (blood work) drawn today and your tests are completely normal, you will receive your results only by: [place] (if you have MyChart) OR A paper copy in the mail If you have any lab test that is abnormal or we need to change your treatment, we will call you to review the results.   Testing/Procedures: NONE ORDERED  TODAY   Follow-Up: At [facility] [place], you and your health needs are our priority.  As part of our continuing mission to provide you with exceptional heart care, we have created designated Provider Care Teams.  These Care Teams include your primary Cardiologist (physician) and Advanced Practice Providers (APPs -  Physician Assistants and Nurse Practitioners) who all work together to provide you with the care you need, when you need it.  We recommend signing up for the patient portal called "MyChart".  Sign up information is provided on this After Visit Summary.  MyChart is used to connect with patients for Virtual Visits (Telemedicine).  Patients are able to view lab/test results, encounter notes, upcoming appointments, etc.  Non-urgent messages can be sent to your provider as well.   To learn more about what you can do with MyChart, go to [email or web address].    Your next appointment:    2 month(s) ( CONTACT [facility] FOR EP SCHEDULING ISSUES )   Provider:   Allegra Lai, MD  ONLY   Other Instructions

## 2022-05-04 NOTE — Addendum Note (Signed)
Addended byRonie Spies on: 05/04/2022 09:10 AM   Modules accepted: Orders

## 2022-05-05 ENCOUNTER — Telehealth: Payer: Self-pay | Admitting: Cardiology

## 2022-05-05 ENCOUNTER — Other Ambulatory Visit: Payer: Self-pay

## 2022-05-05 ENCOUNTER — Telehealth: Payer: Self-pay | Admitting: Pulmonary Disease

## 2022-05-05 NOTE — Telephone Encounter (Signed)
PT calling stating his Pharm has been trying to get through to [place] to call in his Advair. [facility] on [address].  Pls call PT to advise status of this [phone]  He feels it should have refills rather than always having to call.

## 2022-05-05 NOTE — Telephone Encounter (Signed)
Called pt, was unable to leave message, called pt's sister [name] [name] and left voice mail stated that pt would have to contact PCP for a refill on his inhaler and if he has any cardiac questions, problems or concerns, to call our office.

## 2022-05-05 NOTE — Telephone Encounter (Signed)
*  STAT* If patient is at the pharmacy, call can be transferred to refill team.   1. Which medications need to be refilled? (please list name of each medication and dose if known) ADVAIR DISKUS 250-50 MCG/ACT AEPB   2. Which pharmacy/location (including street and city if local pharmacy) is medication to be sent to?  [place], [place]    3. Do they need a 30 day or 90 day supply? [place]

## 2022-05-05 NOTE — Telephone Encounter (Signed)
ATC patient. Unable to leave vm due to mailbox being full. Will try to call again later.  

## 2022-05-09 NOTE — Telephone Encounter (Signed)
Meds sent to pharmacy for pt. Nothing further needed.

## 2022-06-16 ENCOUNTER — Encounter (HOSPITAL_BASED_OUTPATIENT_CLINIC_OR_DEPARTMENT_OTHER): Payer: Self-pay | Admitting: Emergency Medicine

## 2022-06-16 ENCOUNTER — Other Ambulatory Visit: Payer: Self-pay

## 2022-06-16 ENCOUNTER — Emergency Department (HOSPITAL_BASED_OUTPATIENT_CLINIC_OR_DEPARTMENT_OTHER): Payer: Medicaid Other

## 2022-06-16 ENCOUNTER — Inpatient Hospital Stay (HOSPITAL_BASED_OUTPATIENT_CLINIC_OR_DEPARTMENT_OTHER)
Admission: EM | Admit: 2022-06-16 | Discharge: 2022-06-23 | DRG: 291 | Disposition: A | Discharge status: Home Health | Payer: Medicaid Other | Attending: Internal Medicine | Admitting: Internal Medicine

## 2022-06-16 DIAGNOSIS — Z7901 Long term (current) use of anticoagulants: Secondary | ICD-10-CM

## 2022-06-16 DIAGNOSIS — Z6841 Body Mass Index (BMI) 40.0 and over, adult: Secondary | ICD-10-CM

## 2022-06-16 DIAGNOSIS — Z7951 Long term (current) use of inhaled steroids: Secondary | ICD-10-CM

## 2022-06-16 DIAGNOSIS — M545 Low back pain, unspecified: Secondary | ICD-10-CM | POA: Diagnosis present

## 2022-06-16 DIAGNOSIS — I4819 Other persistent atrial fibrillation: Secondary | ICD-10-CM | POA: Diagnosis present

## 2022-06-16 DIAGNOSIS — J189 Pneumonia, unspecified organism: Secondary | ICD-10-CM | POA: Diagnosis present

## 2022-06-16 DIAGNOSIS — I130 Hypertensive heart and chronic kidney disease with heart failure and stage 1 through stage 4 chronic kidney disease, or unspecified chronic kidney disease: Principal | ICD-10-CM | POA: Diagnosis present

## 2022-06-16 DIAGNOSIS — N182 Chronic kidney disease, stage 2 (mild): Secondary | ICD-10-CM | POA: Diagnosis present

## 2022-06-16 DIAGNOSIS — Z1152 Encounter for screening for COVID-19: Secondary | ICD-10-CM

## 2022-06-16 DIAGNOSIS — M5136 Other intervertebral disc degeneration, lumbar region: Secondary | ICD-10-CM | POA: Diagnosis present

## 2022-06-16 DIAGNOSIS — F411 Generalized anxiety disorder: Secondary | ICD-10-CM

## 2022-06-16 DIAGNOSIS — J9811 Atelectasis: Secondary | ICD-10-CM | POA: Diagnosis present

## 2022-06-16 DIAGNOSIS — G4733 Obstructive sleep apnea (adult) (pediatric): Secondary | ICD-10-CM | POA: Diagnosis present

## 2022-06-16 DIAGNOSIS — K219 Gastro-esophageal reflux disease without esophagitis: Secondary | ICD-10-CM | POA: Diagnosis present

## 2022-06-16 DIAGNOSIS — Z9981 Dependence on supplemental oxygen: Secondary | ICD-10-CM

## 2022-06-16 DIAGNOSIS — M7989 Other specified soft tissue disorders: Secondary | ICD-10-CM | POA: Diagnosis present

## 2022-06-16 DIAGNOSIS — Z87891 Personal history of nicotine dependence: Secondary | ICD-10-CM

## 2022-06-16 DIAGNOSIS — I5033 Acute on chronic diastolic (congestive) heart failure: Secondary | ICD-10-CM

## 2022-06-16 DIAGNOSIS — Z8249 Family history of ischemic heart disease and other diseases of the circulatory system: Secondary | ICD-10-CM

## 2022-06-16 DIAGNOSIS — E871 Hypo-osmolality and hyponatremia: Secondary | ICD-10-CM | POA: Diagnosis present

## 2022-06-16 DIAGNOSIS — I509 Heart failure, unspecified: Secondary | ICD-10-CM

## 2022-06-16 DIAGNOSIS — Z825 Family history of asthma and other chronic lower respiratory diseases: Secondary | ICD-10-CM

## 2022-06-16 DIAGNOSIS — I2699 Other pulmonary embolism without acute cor pulmonale: Secondary | ICD-10-CM | POA: Diagnosis present

## 2022-06-16 DIAGNOSIS — G8929 Other chronic pain: Secondary | ICD-10-CM

## 2022-06-16 DIAGNOSIS — Z79899 Other long term (current) drug therapy: Secondary | ICD-10-CM

## 2022-06-16 DIAGNOSIS — Z86711 Personal history of pulmonary embolism: Secondary | ICD-10-CM

## 2022-06-16 DIAGNOSIS — J449 Chronic obstructive pulmonary disease, unspecified: Secondary | ICD-10-CM

## 2022-06-16 DIAGNOSIS — R9431 Abnormal electrocardiogram [ECG] [EKG]: Secondary | ICD-10-CM

## 2022-06-16 DIAGNOSIS — J9621 Acute and chronic respiratory failure with hypoxia: Secondary | ICD-10-CM

## 2022-06-16 LAB — COMPREHENSIVE METABOLIC PANEL
ALT: 23 U/L (ref 0–44)
AST: 32 U/L (ref 15–41)
Albumin: 3.7 g/dL (ref 3.5–5.0)
Alkaline Phosphatase: 56 U/L (ref 38–126)
Anion gap: 10 (ref 5–15)
BUN: 24 mg/dL — ABNORMAL HIGH (ref 6–20)
CO2: 33 mmol/L — ABNORMAL HIGH (ref 22–32)
Calcium: 8.4 mg/dL — ABNORMAL LOW (ref 8.9–10.3)
Chloride: 93 mmol/L — ABNORMAL LOW (ref 98–111)
Creatinine, Ser: 1.18 mg/dL (ref 0.61–1.24)
GFR, Estimated: >60 mL/min (ref >60)
Glucose, Bld: 93 mg/dL (ref 70–99)
Potassium: 4.3 mmol/L (ref 3.5–5.1)
Sodium: 136 mmol/L (ref 135–145)
Total Bilirubin: 0.9 mg/dL (ref 0.3–1.2)
Total Protein: 7.5 g/dL (ref 6.5–8.1)

## 2022-06-16 LAB — CBC WITH DIFFERENTIAL/PLATELET
Abs Immature Granulocytes: 0.09 10*3/uL — ABNORMAL HIGH (ref 0.00–0.07)
Basophils Absolute: 0.1 10*3/uL (ref 0.0–0.1)
Basophils Relative: 1 %
Eosinophils Absolute: 0.1 10*3/uL (ref 0.0–0.5)
Eosinophils Relative: 1 %
HCT: 48.2 % (ref 39.0–52.0)
Hemoglobin: 15.8 g/dL (ref 13.0–17.0)
Immature Granulocytes: 1 %
Lymphocytes Relative: 9 %
Lymphs Abs: 0.9 10*3/uL (ref 0.7–4.0)
MCH: 30.6 pg (ref 26.0–34.0)
MCHC: 32.8 g/dL (ref 30.0–36.0)
MCV: 93.2 fL (ref 80.0–100.0)
Monocytes Absolute: 0.6 10*3/uL (ref 0.1–1.0)
Monocytes Relative: 5 %
Neutro Abs: 9.1 10*3/uL — ABNORMAL HIGH (ref 1.7–7.7)
Neutrophils Relative %: 83 %
Platelets: 265 10*3/uL (ref 150–400)
RBC: 5.17 MIL/uL (ref 4.22–5.81)
RDW: 14.2 % (ref 11.5–15.5)
WBC: 10.8 10*3/uL — ABNORMAL HIGH (ref 4.0–10.5)
nRBC: 0 % (ref 0.0–0.2)

## 2022-06-16 LAB — BRAIN NATRIURETIC PEPTIDE: B Natriuretic Peptide: 155.6 pg/mL — ABNORMAL HIGH (ref 0.0–100.0)

## 2022-06-16 LAB — D-DIMER, QUANTITATIVE: D-Dimer, Quant: 0.77 ug/mL-FEU — ABNORMAL HIGH (ref 0.00–0.50)

## 2022-06-16 LAB — SARS CORONAVIRUS 2 BY RT PCR: SARS Coronavirus 2 by RT PCR: NEGATIVE

## 2022-06-16 MED ORDER — SODIUM CHLORIDE 0.9 % IV SOLN
500.0000 mg | Freq: Once | INTRAVENOUS | Status: AC
  Administered 2022-06-16: 500 mg via INTRAVENOUS
  Filled 2022-06-16: qty 5

## 2022-06-16 MED ORDER — IPRATROPIUM-ALBUTEROL 0.5-2.5 (3) MG/3ML IN SOLN: 3.0000 mL | Freq: Once | RESPIRATORY_TRACT | Status: AC

## 2022-06-16 MED ORDER — HYDROMORPHONE HCL 1 MG/ML IJ SOLN
0.5000 mg | Freq: Once | INTRAMUSCULAR | Status: AC
  Administered 2022-06-16: 0.5 mg via INTRAVENOUS
  Filled 2022-06-16: qty 1

## 2022-06-16 MED ORDER — IPRATROPIUM-ALBUTEROL 0.5-2.5 (3) MG/3ML IN SOLN
RESPIRATORY_TRACT | Status: AC
  Administered 2022-06-16: 3 mL via RESPIRATORY_TRACT
  Filled 2022-06-16: qty 3

## 2022-06-16 MED ORDER — IOHEXOL 350 MG/ML SOLN
100.0000 mL | Freq: Once | INTRAVENOUS | Status: AC | PRN
  Administered 2022-06-16: 100 mL via INTRAVENOUS

## 2022-06-16 MED ORDER — SODIUM CHLORIDE 0.9 % IV SOLN
2.0000 g | Freq: Once | INTRAVENOUS | Status: AC
  Administered 2022-06-16: 2 g via INTRAVENOUS
  Filled 2022-06-16: qty 20

## 2022-06-16 NOTE — ED Notes (Signed)
   06/16/22 1447  Aerosol Therapy Tx  $ Hand Held Nebulizer  1  Medications Duoneb  Solution Normal saline  Delivery Source Oxygen  Delivery Device HHN  Pre-Treatment Pulse 78  Pre-Treatment Respirations 22  Post-Treatment Pulse 77  Post-Treatment Respirations 18  Treatment Tolerance Tolerated well  Treatment Given 1  RT Breath Sounds  Bilateral Breath Sounds Diminished  Oxygen Therapy/Pulse Ox  O2 Device Venturi Mask  FiO2 (%) 55 %  SpO2 93 %

## 2022-06-16 NOTE — Progress Notes (Signed)
Plan of Care Note for accepted transfer   Patient: [name] [name] MRN: [id number]   DOA: 06/16/2022  Facility requesting transfer: [facility] Requesting Provider: Dr. Rubin Payor Reason for transfer: Hypoxia Facility course: 62 yo M with h/o PEs, A.Fib COPD, obesity and OSA.  Has been taking AC as prescribed.  In to ED with c/o SOB, satting 80-85% on RA but pulm notes indicate he has chronic O2 need (supposed to be on chronic O2 including during day according to 10/17/21 note).  WBC 10k.  CTA chest today = no acute PE, does have chronic fibrinous non-occlusive residual embolus, chronic RML collapse, and multifocal PNA.  EDP wants admit for PNA.  ? PAH with the residual embolus?  Sounds like real issue was that pt wasn't using his home O2 when he came in.  And real reason for presentation is pts back pain, which I warned EDP repeatedly that we wouldn't be addressing during admit.  EDP still wants admission.  [facility] will assume care on arrival to accepting facility. Until arrival, care as per EDP. However, [facility] available 24/7 for questions and assistance.  Nursing staff, please page [facility] Admits and Consults [phone]) as soon as the patient arrives to the hospital.    Plan of care: The patient is accepted for admission to Telemetry unit, at [facility]..   Author: Hillary Bow., DO 06/16/2022  Check [email or web address] for on-call coverage.  Nursing staff, Please call [facility] Admits & Consults System-Wide number on Amion as soon as patient's arrival, so appropriate admitting provider can evaluate the pt.

## 2022-06-16 NOTE — ED Triage Notes (Signed)
Back pain , hypoxic in triage , Hx PE . Obvious resp distress. On Eliquis .

## 2022-06-16 NOTE — ED Provider Notes (Signed)
[place] [facility] Provider Note   CSN: [id number] Arrival date & time: 06/16/22  1433     History  Chief Complaint  Patient presents with   Shortness of Breath    [name] [name] is a 62 y.o. male.   Shortness of Breath Patient presents with shortness of breath.  Has had for the last couple weeks but worse the last few days.  States he has had trouble doing his basic activities.  Is somewhat short of breath but states worse now.  Occasional cough but no production.  Has worsening swelling in his legs.  Previous history of pulmonary embolism.  States he has been compliant with his anticoagulation however.  No known sick contacts.  However states his back is much more tender.  States pain in the low back makes it worse with walking.  States he is seen [facility] and nothing was really done for it.  States his legs get weak at a time but have improved now.  However has more swelling on the legs.  Upon arrival here had sats of 80%.  However reviewed pulmonary notes and has had ambulatory sats of 85 without oxygen.    Past Medical History:  Diagnosis Date   Chronic diastolic CHF (congestive heart failure) (HCC)    COPD (chronic obstructive pulmonary disease) (HCC)    Hypertension    Obesity (BMI 30-39.9) 12/15/2016   OSA (obstructive sleep apnea)    Persistent atrial fibrillation (HCC) 12/15/2016   Visit for monitoring Tikosyn therapy     Home Medications Prior to Admission medications   Medication Sig Start Date End Date Taking? Authorizing Provider  albuterol (PROAIR HFA) 108 (90 Base) MCG/ACT inhaler TAKE 1 TO 2 PUFFS EVERY 4 TO 6 HOURS AS NEEDED FOR SHORTNESS OF BREATH. 05/09/22   Coralyn Helling, MD  apixaban (ELIQUIS) 5 MG TABS tablet TAKE 1 TABLET BY MOUTH 2 TIMES DAILY. 04/27/22   Camnitz, Andree Coss, MD  Budeson-Glycopyrrol-Formoterol (BREZTRI AEROSPHERE) 160-9-4.8 MCG/ACT AERO Inhale 2 puffs into the lungs in the morning and at bedtime.  10/17/21   Coralyn Helling, MD  Budeson-Glycopyrrol-Formoterol (BREZTRI AEROSPHERE) 160-9-4.8 MCG/ACT AERO Inhale 2 puffs into the lungs in the morning and at bedtime. 10/17/21   Coralyn Helling, MD  dofetilide (TIKOSYN) 500 MCG capsule TAKE 1 CAPSULE BY MOUTH 2 TIMES DAILY. 02/17/22   Camnitz, Andree Coss, MD  fluticasone-salmeterol (ADVAIR DISKUS) 250-50 MCG/ACT AEPB INHALE 1 PUFF EVERY 12 HOURS. RINSE MOUTH AFTER USE. 05/09/22   Coralyn Helling, MD  metoprolol succinate (TOPROL-XL) 100 MG 24 hr tablet TAKE 1 TABLET (100 MG TOTAL) BY MOUTH DAILY. 02/24/22   Camnitz, Will Daphine Deutscher, MD  pantoprazole (PROTONIX) 40 MG tablet Take 1 tablet (40 mg total) by mouth daily. 06/30/21 08/29/21  Dimple Nanas, MD      Allergies    Patient has no known allergies.    Review of Systems   Review of Systems  Respiratory:  Positive for shortness of breath.     Physical Exam Updated Vital Signs BP 127/63   Pulse 78   Temp 97.8 F (36.6 C) (Oral)   Resp 17   Wt 136.1 kg   SpO2 94%   BMI 44.30 kg/m  Physical Exam Vitals and nursing note reviewed.  Cardiovascular:     Rate and Rhythm: Normal rate.  Pulmonary:     Effort: Tachypnea present.     Breath sounds: No wheezing or rhonchi.  Chest:     Chest wall:  No tenderness.  Abdominal:     Tenderness: There is no abdominal tenderness.  Musculoskeletal:     Right lower leg: Edema present.     Left lower leg: Edema present.     Comments: Moderate pain to lumbar spine.  Skin:    General: Skin is warm.     Capillary Refill: Capillary refill takes less than 2 seconds.  Neurological:     Mental Status: He is alert.     ED Results / Procedures / Treatments   Labs (all labs ordered are listed, but only abnormal results are displayed) Labs Reviewed  COMPREHENSIVE METABOLIC PANEL - Abnormal; Notable for the following components:      Result Value   Chloride 93 (*)    CO2 33 (*)    BUN 24 (*)    Calcium 8.4 (*)    All other components within normal limits   BRAIN NATRIURETIC PEPTIDE - Abnormal; Notable for the following components:   B Natriuretic Peptide 155.6 (*)    All other components within normal limits  CBC WITH DIFFERENTIAL/PLATELET - Abnormal; Notable for the following components:   WBC 10.8 (*)    Neutro Abs 9.1 (*)    Abs Immature Granulocytes 0.09 (*)    All other components within normal limits  D-DIMER, QUANTITATIVE - Abnormal; Notable for the following components:   D-Dimer, Quant 0.77 (*)    All other components within normal limits  SARS CORONAVIRUS 2 BY RT PCR    EKG None  Radiology CT Lumbar Spine Wo Contrast  Result Date: 06/16/2022 CLINICAL DATA:  Low back pain.  Spondyloarthropathy. EXAM: CT LUMBAR SPINE WITHOUT CONTRAST TECHNIQUE: Multidetector CT imaging of the lumbar spine was performed without intravenous contrast administration. Multiplanar CT image reconstructions were also generated. RADIATION DOSE REDUCTION: This exam was performed according to the departmental dose-optimization program which includes automated exposure control, adjustment of the mA and/or kV according to patient size and/or use of iterative reconstruction technique. COMPARISON:  None Available. FINDINGS: Segmentation: 5 lumbar type vertebrae. Alignment: Normal. Vertebrae: No acute fracture or aggressive osseous lesion. Paraspinal and other soft tissues: Negative. Other: None Disc levels: T12-L1: Disc height loss without significant disc bulge, spinal canal or neural foraminal stenosis. L1-L2: No significant disc bulge, spinal canal or neural foraminal stenosis. L2-L3: No significant disc bulge, spinal canal or neural foraminal stenosis. L3-L4: Disc height loss with mild disc bulge and mild bilateral facet joint arthropathy. Mild lateral recess stenosis bilaterally. No significant neural foraminal stenosis. L4-L5: Broad-based disc bulge with mild lateral recess stenosis bilaterally. Mild bilateral facet joint arthropathy. No significant neural  foraminal stenosis. L5-S1: Mild circumferential disc bulge without significant spinal canal or neural foraminal stenosis. Mild bilateral facet joint arthropathy. IMPRESSION: 1. Multilevel degenerative disc disease with mild disc bulge at L3-L4 and L4-L5 with mild lateral recess stenosis bilaterally at L3-L4 and L4-L5. No significant neural foraminal stenosis. 2. Mild bilateral facet joint arthropathy at L3-L4, L4-L5 and L5-S1. 3. No acute fracture or aggressive osseous lesion. Electronically Signed   By: Larose Hires D.O.   On: 06/16/2022 18:09   CT Angio Chest PE W and/or Wo Contrast  Result Date: 06/16/2022 CLINICAL DATA:  Respiratory distress. Clinical concern for pulmonary embolus. Positive D-dimer. EXAM: CT ANGIOGRAPHY CHEST WITH CONTRAST TECHNIQUE: Multidetector CT imaging of the chest was performed using the standard protocol during bolus administration of intravenous contrast. Multiplanar CT image reconstructions and MIPs were obtained to evaluate the vascular anatomy. RADIATION DOSE REDUCTION: This exam was performed  according to the departmental dose-optimization program which includes automated exposure control, adjustment of the mA and/or kV according to patient size and/or use of iterative reconstruction technique. CONTRAST:  OMNIPAQUE IOHEXOL 350 MG/ML SOLN COMPARISON:  06/25/2021 FINDINGS: Cardiovascular: The heart size is normal. No substantial pericardial effusion. Mild atherosclerotic calcification is noted in the wall of the thoracic aorta. Linear filling defect identified in segmental/subsegmental branches to the right lower lobe (image 196/5), in the region of the acute pulmonary embolus seen on the study from 1 year ago. Imaging features today are compatible with chronic fibrinous nonocclusive subsegmental right lower lobe embolus. Similar linear filling defect identified in a segmental left upper lobe branch on image 134/5 in a region of acute embolus seen on the previous exam. No  evidence for acute pulmonary embolus on today's study. Mediastinum/Nodes: No mediastinal lymphadenopathy. There is no hilar lymphadenopathy. The esophagus has normal imaging features. There is no axillary lymphadenopathy. Lungs/Pleura: Centrilobular and paraseptal emphysema evident. There is new central ground-glass airspace disease in the anterior right upper lobe (38/6) with patchy and nodular ground-glass and consolidative airspace disease in the inferior right upper lobe. Right middle lobe collapse is similar to prior. Mild bronchiectasis with bronchial wall thickening and peripheral airway impaction in the dependent lower lobes is similar to prior. No substantial pleural effusion. Upper Abdomen: Visualized portion of the upper abdomen is unremarkable. Musculoskeletal: No worrisome lytic or sclerotic osseous abnormality. Review of the MIP images confirms the above findings. IMPRESSION: 1. No CT evidence for acute pulmonary embolus. 2. Linear filling defects in segmental/subsegmental branches to the right lower lobe and left upper lobe in the areas of acute pulmonary embolus seen on the study from 1 year ago. Imaging features today are compatible with chronic fibrinous nonocclusive residual embolus. 3. New central ground-glass airspace disease in the anterior right upper lobe with patchy and nodular ground-glass and consolidative airspace disease in the inferior right upper lobe. Imaging features are compatible with multifocal infectious/inflammatory etiology. 4. Right middle lobe collapse is similar to prior. 5. Mild bronchiectasis with bronchial wall thickening and peripheral airway impaction in the dependent lower lobes is similar to prior. 6. Aortic Atherosclerosis (ICD10-I70.0) and Emphysema (ICD10-J43.9). Electronically Signed   By: Kennith Center M.D.   On: 06/16/2022 18:00   DG Chest Portable 1 View  Result Date: 06/16/2022 CLINICAL DATA:  Shortness of breath EXAM: PORTABLE CHEST 1 VIEW COMPARISON:   X-ray 06/25/2021 and older.  CT angiogram 06/25/2021. FINDINGS: Under penetrated radiograph with overlapping cardiac leads. Hyperinflation. Stable cardiopericardial silhouette. Mild left basilar atelectasis. The right inferior costophrenic angle is clipped off the edge of the film. No pneumothorax or effusion. No edema. IMPRESSION: Under penetrated radiograph. Hyperinflation. Stable cardiopericardial silhouette. Left-sided basilar atelectasis or scar Electronically Signed   By: Karen Kays M.D.   On: 06/16/2022 15:11    Procedures Procedures    Medications Ordered in ED Medications  azithromycin (ZITHROMAX) 500 mg in sodium chloride 0.9 % 250 mL IVPB (500 mg Intravenous New Bag/Given 06/16/22 1855)  ipratropium-albuterol (DUONEB) 0.5-2.5 (3) MG/3ML nebulizer solution 3 mL (3 mLs Nebulization Given 06/16/22 1447)  iohexol (OMNIPAQUE) 350 MG/ML injection 100 mL (100 mLs Intravenous Contrast Given 06/16/22 1716)  HYDROmorphone (DILAUDID) injection 0.5 mg (0.5 mg Intravenous Given 06/16/22 1728)  cefTRIAXone (ROCEPHIN) 2 g in sodium chloride 0.9 % 100 mL IVPB (0 g Intravenous Stopped 06/16/22 1851)    ED Course/ Medical Decision Making/ A&P  Medical Decision Making Amount and/or Complexity of Data Reviewed Labs: ordered. Radiology: ordered.  Risk Prescription drug management. Decision regarding hospitalization.   Patient shortness of breath and hypoxia.  Differential diagnoses long but includes pulmonary embolism, which she has had before, COPD and CHF which she also has.  Worsening edema on the legs.  X-ray shows nonspecific changes without edema.  No pneumonia.  Also back pain.  Doubt causing severe difficulty breathing but definitely could cause decreased mobility.  Will check basic blood work.  Will get D-dimer to help evaluate for pulmonary embolism.  X-ray reassuring but D-dimer elevated.  BNP also somewhat elevated.  Has edema on his legs.  CT scan done and  does not show pulmonary embolism but does show pneumonia.  With worsening hypoxia not able to do the same activities before I feel he would benefit from mission to the hospital.  Also lumbar spine CT scan done to worsening back pain.  Does have some bulging disks and likely pain.  Can follow-up as an outpatient for definitive follow-up but will likely need more pain control while in the hospital.  Will discuss with hospitalist for admission.         Final Clinical Impression(s) / ED Diagnoses Final diagnoses:  Community acquired pneumonia, unspecified laterality    Rx / DC Orders ED Discharge Orders     None         Benjiman Core, MD 06/16/22 [phone]

## 2022-06-17 DIAGNOSIS — Z8249 Family history of ischemic heart disease and other diseases of the circulatory system: Secondary | ICD-10-CM | POA: Diagnosis not present

## 2022-06-17 DIAGNOSIS — F411 Generalized anxiety disorder: Secondary | ICD-10-CM

## 2022-06-17 DIAGNOSIS — M7989 Other specified soft tissue disorders: Secondary | ICD-10-CM | POA: Diagnosis present

## 2022-06-17 DIAGNOSIS — Z79899 Other long term (current) drug therapy: Secondary | ICD-10-CM | POA: Diagnosis not present

## 2022-06-17 DIAGNOSIS — I5033 Acute on chronic diastolic (congestive) heart failure: Secondary | ICD-10-CM | POA: Diagnosis present

## 2022-06-17 DIAGNOSIS — K219 Gastro-esophageal reflux disease without esophagitis: Secondary | ICD-10-CM | POA: Diagnosis present

## 2022-06-17 DIAGNOSIS — J9811 Atelectasis: Secondary | ICD-10-CM | POA: Diagnosis present

## 2022-06-17 DIAGNOSIS — G8929 Other chronic pain: Secondary | ICD-10-CM | POA: Diagnosis present

## 2022-06-17 DIAGNOSIS — J189 Pneumonia, unspecified organism: Secondary | ICD-10-CM | POA: Diagnosis present

## 2022-06-17 DIAGNOSIS — Z1152 Encounter for screening for COVID-19: Secondary | ICD-10-CM | POA: Diagnosis not present

## 2022-06-17 DIAGNOSIS — J449 Chronic obstructive pulmonary disease, unspecified: Secondary | ICD-10-CM | POA: Diagnosis present

## 2022-06-17 DIAGNOSIS — I130 Hypertensive heart and chronic kidney disease with heart failure and stage 1 through stage 4 chronic kidney disease, or unspecified chronic kidney disease: Secondary | ICD-10-CM | POA: Diagnosis present

## 2022-06-17 DIAGNOSIS — Z9981 Dependence on supplemental oxygen: Secondary | ICD-10-CM | POA: Diagnosis not present

## 2022-06-17 DIAGNOSIS — Z86711 Personal history of pulmonary embolism: Secondary | ICD-10-CM | POA: Diagnosis not present

## 2022-06-17 DIAGNOSIS — I5031 Acute diastolic (congestive) heart failure: Secondary | ICD-10-CM | POA: Diagnosis not present

## 2022-06-17 DIAGNOSIS — R9431 Abnormal electrocardiogram [ECG] [EKG]: Secondary | ICD-10-CM | POA: Insufficient documentation

## 2022-06-17 DIAGNOSIS — N182 Chronic kidney disease, stage 2 (mild): Secondary | ICD-10-CM | POA: Diagnosis present

## 2022-06-17 DIAGNOSIS — E871 Hypo-osmolality and hyponatremia: Secondary | ICD-10-CM | POA: Diagnosis present

## 2022-06-17 DIAGNOSIS — Z825 Family history of asthma and other chronic lower respiratory diseases: Secondary | ICD-10-CM | POA: Diagnosis not present

## 2022-06-17 DIAGNOSIS — Z7901 Long term (current) use of anticoagulants: Secondary | ICD-10-CM | POA: Diagnosis not present

## 2022-06-17 DIAGNOSIS — G4733 Obstructive sleep apnea (adult) (pediatric): Secondary | ICD-10-CM | POA: Diagnosis present

## 2022-06-17 DIAGNOSIS — Z87891 Personal history of nicotine dependence: Secondary | ICD-10-CM | POA: Diagnosis not present

## 2022-06-17 DIAGNOSIS — I2699 Other pulmonary embolism without acute cor pulmonale: Secondary | ICD-10-CM | POA: Diagnosis not present

## 2022-06-17 DIAGNOSIS — J9621 Acute and chronic respiratory failure with hypoxia: Secondary | ICD-10-CM | POA: Diagnosis present

## 2022-06-17 DIAGNOSIS — J439 Emphysema, unspecified: Secondary | ICD-10-CM | POA: Diagnosis not present

## 2022-06-17 DIAGNOSIS — Z6841 Body Mass Index (BMI) 40.0 and over, adult: Secondary | ICD-10-CM | POA: Diagnosis not present

## 2022-06-17 DIAGNOSIS — I4819 Other persistent atrial fibrillation: Secondary | ICD-10-CM | POA: Diagnosis present

## 2022-06-17 DIAGNOSIS — M5136 Other intervertebral disc degeneration, lumbar region: Secondary | ICD-10-CM | POA: Diagnosis present

## 2022-06-17 LAB — T4, FREE: Free T4: 0.79 ng/dL (ref 0.61–1.12)

## 2022-06-17 LAB — TSH: TSH: 6.189 u[IU]/mL — ABNORMAL HIGH (ref 0.350–4.500)

## 2022-06-17 LAB — VITAMIN D 25 HYDROXY (VIT D DEFICIENCY, FRACTURES): Vit D, 25-Hydroxy: 26.21 ng/mL — ABNORMAL LOW (ref 30–100)

## 2022-06-17 LAB — PROCALCITONIN: Procalcitonin: <0.1 ng/mL

## 2022-06-17 LAB — MAGNESIUM: Magnesium: 2.2 mg/dL (ref 1.7–2.4)

## 2022-06-17 MED ORDER — HYDROXYZINE HCL 25 MG PO TABS: 25.0000 mg | ORAL_TABLET | Freq: Three times a day (TID) | ORAL | Status: DC | PRN

## 2022-06-17 MED ORDER — SODIUM CHLORIDE 0.9 % IV SOLN: 500.0000 mg | INTRAVENOUS | Status: DC

## 2022-06-17 MED ORDER — DOFETILIDE 250 MCG PO CAPS
500.0000 ug | ORAL_CAPSULE | Freq: Two times a day (BID) | ORAL | Status: DC
  Administered 2022-06-17 – 2022-06-23 (×13): 500 ug via ORAL
  Filled 2022-06-17 (×13): qty 2

## 2022-06-17 MED ORDER — APIXABAN 5 MG PO TABS
5.0000 mg | ORAL_TABLET | Freq: Two times a day (BID) | ORAL | Status: DC
  Administered 2022-06-17 – 2022-06-23 (×13): 5 mg via ORAL
  Filled 2022-06-17 (×13): qty 1

## 2022-06-17 MED ORDER — METOPROLOL SUCCINATE ER 25 MG PO TB24
25.0000 mg | ORAL_TABLET | Freq: Two times a day (BID) | ORAL | Status: DC
  Administered 2022-06-17 – 2022-06-23 (×13): 25 mg via ORAL
  Filled 2022-06-17 (×13): qty 1

## 2022-06-17 MED ORDER — ALBUTEROL SULFATE (2.5 MG/3ML) 0.083% IN NEBU: 2.5000 mg | INHALATION_SOLUTION | RESPIRATORY_TRACT | Status: DC | PRN

## 2022-06-17 MED ORDER — ACETAMINOPHEN 650 MG RE SUPP: 650.0000 mg | Freq: Four times a day (QID) | RECTAL | Status: DC | PRN

## 2022-06-17 MED ORDER — MOMETASONE FURO-FORMOTEROL FUM 200-5 MCG/ACT IN AERO
2.0000 | INHALATION_SPRAY | Freq: Two times a day (BID) | RESPIRATORY_TRACT | Status: DC
  Administered 2022-06-17 – 2022-06-23 (×13): 2 via RESPIRATORY_TRACT
  Filled 2022-06-17: qty 8.8

## 2022-06-17 MED ORDER — MONTELUKAST SODIUM 10 MG PO TABS
10.0000 mg | ORAL_TABLET | Freq: Every day | ORAL | Status: DC
  Administered 2022-06-18 – 2022-06-22 (×5): 10 mg via ORAL
  Filled 2022-06-17 (×6): qty 1

## 2022-06-17 MED ORDER — DOXYCYCLINE HYCLATE 100 MG PO TABS
100.0000 mg | ORAL_TABLET | Freq: Two times a day (BID) | ORAL | Status: DC
  Administered 2022-06-17 – 2022-06-18 (×3): 100 mg via ORAL
  Filled 2022-06-17 (×3): qty 1

## 2022-06-17 MED ORDER — SODIUM CHLORIDE 0.9 % IV SOLN
2.0000 g | INTRAVENOUS | Status: DC
  Administered 2022-06-17: 2 g via INTRAVENOUS
  Filled 2022-06-17: qty 20

## 2022-06-17 MED ORDER — PANTOPRAZOLE SODIUM 40 MG PO TBEC: 40.0000 mg | DELAYED_RELEASE_TABLET | ORAL | Status: DC | PRN

## 2022-06-17 MED ORDER — HYDROMORPHONE HCL 1 MG/ML IJ SOLN
0.5000 mg | Freq: Once | INTRAMUSCULAR | Status: AC
  Administered 2022-06-17: 0.5 mg via INTRAVENOUS
  Filled 2022-06-17: qty 1

## 2022-06-17 MED ORDER — TRAMADOL HCL 50 MG PO TABS
50.0000 mg | ORAL_TABLET | Freq: Four times a day (QID) | ORAL | Status: DC | PRN
  Administered 2022-06-17 – 2022-06-22 (×13): 50 mg via ORAL
  Filled 2022-06-17 (×13): qty 1

## 2022-06-17 MED ORDER — ACETAMINOPHEN 325 MG PO TABS
650.0000 mg | ORAL_TABLET | Freq: Four times a day (QID) | ORAL | Status: DC | PRN
  Administered 2022-06-17 (×2): 650 mg via ORAL
  Filled 2022-06-17 (×2): qty 2

## 2022-06-17 MED ORDER — FUROSEMIDE 10 MG/ML IJ SOLN
40.0000 mg | Freq: Once | INTRAMUSCULAR | Status: AC
  Administered 2022-06-17: 40 mg via INTRAVENOUS
  Filled 2022-06-17: qty 4

## 2022-06-17 MED ORDER — UMECLIDINIUM BROMIDE 62.5 MCG/ACT IN AEPB
1.0000 | INHALATION_SPRAY | Freq: Every day | RESPIRATORY_TRACT | Status: DC
  Administered 2022-06-17 – 2022-06-23 (×7): 1 via RESPIRATORY_TRACT
  Filled 2022-06-17 (×2): qty 7

## 2022-06-17 MED ORDER — GABAPENTIN 100 MG PO CAPS
100.0000 mg | ORAL_CAPSULE | Freq: Three times a day (TID) | ORAL | Status: DC | PRN
  Administered 2022-06-17 – 2022-06-23 (×11): 100 mg via ORAL
  Filled 2022-06-17 (×11): qty 1

## 2022-06-17 MED ORDER — BUDESON-GLYCOPYRROL-FORMOTEROL 160-9-4.8 MCG/ACT IN AERO: 2.0000 | INHALATION_SPRAY | Freq: Two times a day (BID) | RESPIRATORY_TRACT | Status: DC

## 2022-06-17 NOTE — Progress Notes (Signed)
Sputum Specimen container left with patient at bedside unable to have a productive cough at this time. RT instructed patient that if he is able to call RN or RT and we will send off.

## 2022-06-17 NOTE — Assessment & Plan Note (Addendum)
continue eliquis, tikosyn and toprol XL

## 2022-06-17 NOTE — Assessment & Plan Note (Addendum)
Follow up as outpatient.  

## 2022-06-17 NOTE — Assessment & Plan Note (Addendum)
Echocardiogram with preserved LV systolic function, EF 60 to 65%, RV with mild enlargement, with preserved systolic function, trivial pericardial effusion.   Patient was placed on IV furosemide for diuresis, negative fluid balance was achieved, -11,889 ml, with significant improvement in his symptoms.   Continue medical therapy with metoprolol, SGLT 2 inh and spironolactone.  To consider starting ARB as outpatient.   Acute on chronic hypoxemic respiratory failure, due to cardiogenic pulmonary edema,  Pneumonia has been ruled out. 02 saturation is 92 on 5 L/min per [place].

## 2022-06-17 NOTE — Assessment & Plan Note (Addendum)
No signs of exacerbation on exam. PFT in 2023: severe obstruction, positive bronchodilator response, air trapping, and moderate diffusion defect.  -continue Bretzi BID, singulair and SABA prn  Patient with chronic right middle lobe atelectasis.

## 2022-06-17 NOTE — Assessment & Plan Note (Deleted)
CTA chest showing new central ground-glass airspace disease in the anterior right upper lobe with patchy and nodular ground-glass and consolidative airspace disease in the inferior right upper lobe. Imaging features are compatible with multifocal infectious/inflammatory etiology. -continue rocephin, doxy with prolonged QT -check and trend PCT -check RVP -only minimal leukocytosis  -sputum cx  -covid negative  -trend CBC -SABA prn

## 2022-06-17 NOTE — Evaluation (Signed)
Physical Therapy Evaluation Patient Details Name: [name] [name] MRN: [id number] DOB: [birth year] Today's Date: 06/17/2022  History of Present Illness  The pt is a 62 yo male presenting 5/10 with SOB. Found to have COPD exacerbation with hypoxia. PMH includes: CHF, COPD, HTN, obesity, OSA, and afib.   Clinical Impression  Pt in bed upon arrival of PT, agreeable to evaluation at this time. Prior to admission the pt was independent with mobility in the home, but reports recent and significant deficits in endurance (limited to ~10 ft ambulation prior to being SOB and unable to continue). The pt presents today with continued deficits in endurance, but was stable with all transitions OOB and able to complete ~75 ft ambulation with minG for safety and pt reports not as fatigued as he was at home. However, pt on 6L initially for ambulation with SpO2 >92%, but after ~45 ft, SpO2 dropped to 86% so he was placed on 8L which allowed him to return to his room. Once seated, SpO2 was noted to be 80%, required 3 min and cues for PLB to return to mid 90s.  Discussed role of O2 and importance of continued endurance training with patient however, he will likely benefit from further education. Recommend continued Pt acutely and follow-up therapy to ensure compliance and safe progression of endurance training.     Recommendations for follow up therapy are one component of a multi-disciplinary discharge planning process, led by the attending physician.  Recommendations may be updated based on patient status, additional functional criteria and insurance authorization.  Follow Up Recommendations       Assistance Recommended at Discharge Frequent or constant Supervision/Assistance  Patient can return home with the following  Help with stairs or ramp for entrance    Equipment Recommendations  (trial rollator)  Recommendations for Other Services       Functional Status Assessment Patient has had a recent decline in  their functional status and demonstrates the ability to make significant improvements in function in a reasonable and predictable amount of time.     Precautions / Restrictions Precautions Precautions: Fall Precaution Comments: watch O2, on 8L for eval Restrictions Weight Bearing Restrictions: No      Mobility  Bed Mobility Overal bed mobility: Independent             General bed mobility comments: pt sitting EOB upon arrival    Transfers Overall transfer level: Needs assistance Equipment used: None Transfers: Sit to/from Stand Sit to Stand: Supervision           General transfer comment: supervision for safety, VSS    Ambulation/Gait Ambulation/Gait assistance: Min guard Gait Distance (Feet): 75 Feet Assistive device: IV Pole Gait Pattern/deviations: Decreased stride length Gait velocity: decreased Gait velocity interpretation: <1.31 ft/sec, indicative of household ambulator   General Gait Details: pt took 2 standing rest breaks to check O2 levels, intially maintaining mid 90s on 6L, but was 85% after ~40 ft, O2 increased to 8L and pt needed 1-2 min to recover after returning to room. Reports this SOB not as bad as at home but Spo2 low of 80%.  Stairs            Wheelchair Mobility    Modified Rankin (Stroke Patients Only)       Balance Overall balance assessment: Mild deficits observed, not formally tested  Pertinent Vitals/Pain Pain Assessment Pain Assessment: Faces Faces Pain Scale: Hurts a little bit Pain Location: R arm IV site Pain Intervention(s): Monitored during session    Home Living Family/patient expects to be discharged to:: Private residence Living Arrangements: Spouse/significant other Available Help at Discharge: Family;Available 24 hours/day Type of Home: House Home Access: Stairs to enter Entrance Stairs-Rails: None Entrance Stairs-Number of Steps: 4   Home  Layout: One level Home Equipment: None      Prior Function Prior Level of Function : Driving;Needs assist             Mobility Comments: no use of DME, poor endurance. reports he has O2 at home for when he sleeps and is sitting on the couch, sounds like he needs it with activity but has poor medical understanding. ADLs Comments: pt reports poor endurane, sits for shower     Hand Dominance   Dominant Hand: Right    Extremity/Trunk Assessment   Upper Extremity Assessment Upper Extremity Assessment: Overall WFL for tasks assessed    Lower Extremity Assessment Lower Extremity Assessment: Overall WFL for tasks assessed    Cervical / Trunk Assessment Cervical / Trunk Assessment: Normal  Communication   Communication: No difficulties  Cognition Arousal/Alertness: Awake/alert Behavior During Therapy: WFL for tasks assessed/performed Overall Cognitive Status: No family/caregiver present to determine baseline cognitive functioning                                 General Comments: likely close to baseline, pt with poor medical literacy and understanding of safety. A and O x4        General Comments General comments (skin integrity, edema, etc.): SpO2 92% on 8L at rest, improved to 98% with standing on 8L, maintained mid90s on 6L initially with gait but needed increase to 8L due to drop to 80%.    Exercises     Assessment/Plan    PT Assessment Patient needs continued PT services  PT Problem List Cardiopulmonary status limiting activity;Decreased activity tolerance;Decreased balance;Decreased mobility       PT Treatment Interventions Gait training;Stair training;Functional mobility training;Therapeutic activities;Therapeutic exercise;Balance training;Patient/family education    PT Goals (Current goals can be found in the Care Plan section)  Acute Rehab PT Goals Patient Stated Goal: return to independence, improve endurance PT Goal Formulation: With  patient Time For Goal Achievement: 07/01/22 Potential to Achieve Goals: Good    Frequency Min 1X/week     Co-evaluation               AM-PAC PT "6 Clicks" Mobility  Outcome Measure Help needed turning from your back to your side while in a flat bed without using bedrails?: None Help needed moving from lying on your back to sitting on the side of a flat bed without using bedrails?: None Help needed moving to and from a bed to a chair (including a wheelchair)?: A Little Help needed standing up from a chair using your arms (e.g., wheelchair or bedside chair)?: A Little Help needed to walk in hospital room?: A Little Help needed climbing 3-5 steps with a railing? : A Little 6 Click Score: 20    End of Session Equipment Utilized During Treatment: Oxygen Activity Tolerance: Patient tolerated treatment well Patient left: in bed;with call bell/phone within reach (sitting EOB) Nurse Communication: Mobility status PT Visit Diagnosis: Other abnormalities of gait and mobility (R26.89)    Time: 1610-9604 PT Time Calculation (min) (ACUTE  ONLY): 30 min   Charges:   PT Evaluation $PT Eval Low Complexity: 1 Low PT Treatments $Therapeutic Exercise: 8-22 mins        Vickki Muff, PT, DPT   Acute Rehabilitation Department Office [phone] Secure Chat Communication Preferred  [name] [name] 06/17/2022, 4:55 PM

## 2022-06-17 NOTE — Assessment & Plan Note (Deleted)
62 year old male presenting with chronic back pain and some shortness of breath found to be hypoxic to 81% on room air requiring 8L HFNC to maintain oxygenation likely multifactorial in etiology  -obs to tele -CXR stable and CTA shows no acute PE, but does show collapsed right middle lobe similar to prior, new central ground-glass airspace disease in the anterior right upper lobe with patchy and nodular ground-glass and consolidative airspace disease in the inferior right upper lobe. Imaging features are compatible with multifocal infectious/inflammatory etiology.  -treating possible pneumonia -IS to bedside for lung collapse -mild bronchiectasis stable, likely not contributing  -acute on chronic diastolic CHF-diurese -hx of COPD, but no evidence of exacerbation  -hx of afib with high burden of 54% on monitor, in NSR today. Continue to monitor on telemetry  -back pain -anxiety  -wean back to baseline as tolerated  -start back cpap, has not used x 1 year  -consult pulm if no improvement

## 2022-06-17 NOTE — H&P (Signed)
History and Physical    Patient: [name] [name] ZOX:[id number] DOB: Oct 24, 1960 DOA: 06/16/2022 DOS: the patient was seen and examined on 06/17/2022 PCP: Kaleen Mask, MD  Patient coming from:  [facility]  - lives with his girlfriend.    Chief Complaint: shortness of breath and chronic back pain   HPI: [name] is a 62 y.o. male with medical history significant of pulmonary embolus, COPD, atrial fibrillation on DOAC, OSA, GERD, CKD stage 2, HTN, diastolic CHF, chronic respiratory failure on 2L oxygen [place], obesity who presented to ED with shortness of breath and chronic back pain. He thinks his shortness of breath is from his back pain. He states his legs started to swell about 2 weeks ago. He was given lasix by his PCP about 2-3 weeks ago. He hasn't noticed any difference with the swelling in his legs. He has chronic shortness of breath, but states this is a little bit worse. Has orthopnea, but this is baseline due to his back pain. He has gained weight, but is eating poorly. He states he started coughing today, non productive. No fever/chills. He has been around a friend who was diagnosed with pneumonia recently. He has worsening dyspnea on exertion as well. He thinks he has gained 50 pounds over a year.   Has not missed any doses of his eliquis.   Denies any fever/chills, vision changes/headaches, chest pain or palpitations, abdominal pain, N/V/D, dysuria.   Chronic back pain No weakness or numbness. No saddle parasthesia. Does have tingling in legs and pain. Never tried gabapentin. On no mediation for this.   Also has concerns for anxiety which may make his breathing worse.    He does not smoke and drinks alcohol socially.   ER Course:  vitals: afebrile, bp: 117/76, HR: 80, RR: 20, oxgygen: 81%RA>95% on HFNC Pertinent labs: wbc: 10.8, bnp: 155, d-dimer: .77, covid negative  CXR: stable  CTA chest: No acute PE. Linear filling defects in segmental/subsegmental branches to  the right lower lobe and left upper lobe in the areas of acute pulmonary embolus seen on the study from 1 year ago. Imaging features today are compatible with chronic fibrinous nonocclusive residual embolus. New central ground-glass airspace disease in the anterior right upper lobe with patchy and nodular ground-glass and consolidative airspace disease in the inferior right upper lobe. Imaging features are compatible with multifocal infectious/inflammatory etiology. Right middle lobe collapse similar to prior   Mild bronchiectasis with bronchial wall thickening and peripheral airway impaction in the dependent lower lobes is similar to prior. Aortic atherosclerosis  CT lumbar spine: DDD with mild disc bulge at L3-L4 and L4-L5 with mild lateral recess stenosis bilaterally at L3-L4 and L4-L5. No significant neural foraminal stenosis.  In ED: started on rocephin/zithromax. TRH asked to admit.   Review of Systems: As mentioned in the history of present illness. All other systems reviewed and are negative. Past Medical History:  Diagnosis Date   Chronic diastolic CHF (congestive heart failure) (HCC)    COPD (chronic obstructive pulmonary disease) (HCC)    Hypertension    Obesity (BMI 30-39.9) 12/15/2016   OSA (obstructive sleep apnea)    Persistent atrial fibrillation (HCC) 12/15/2016   Visit for monitoring Tikosyn therapy    Past Surgical History:  Procedure Laterality Date   CARDIAC CATHETERIZATION  02/22/2018   CARDIOVERSION N/A 12/22/2016   Procedure: CARDIOVERSION;  Surgeon: Othella Boyer, MD;  Location: [facility] ENDOSCOPY;  Service: Cardiovascular;  Laterality: N/A;   HEMORROIDECTOMY  HERNIA REPAIR     RIGHT/LEFT HEART CATH AND CORONARY ANGIOGRAPHY N/A 02/22/2018   Procedure: RIGHT/LEFT HEART CATH AND CORONARY ANGIOGRAPHY;  Surgeon: Yvonne Kendall, MD;  Location: [facility] INVASIVE CV LAB;  Service: Cardiovascular;  Laterality: N/A;   Social History:  reports that he quit smoking about  15 years ago. His smoking use included cigarettes. He has a 30.00 pack-year smoking history. He has been exposed to tobacco smoke. He has never used smokeless tobacco. He reports current alcohol use of about 4.0 standard drinks of alcohol per week. He reports that he does not use drugs.  No Known Allergies  Family History  Problem Relation Age of Onset   COPD Mother    COPD Father    Atrial fibrillation Brother    CAD Brother     Prior to Admission medications   Medication Sig Start Date End Date Taking? Authorizing Provider  albuterol (PROAIR HFA) 108 (90 Base) MCG/ACT inhaler TAKE 1 TO 2 PUFFS EVERY 4 TO 6 HOURS AS NEEDED FOR SHORTNESS OF BREATH. 05/09/22   Coralyn Helling, MD  apixaban (ELIQUIS) 5 MG TABS tablet TAKE 1 TABLET BY MOUTH 2 TIMES DAILY. 04/27/22   Camnitz, Andree Coss, MD  Budeson-Glycopyrrol-Formoterol (BREZTRI AEROSPHERE) 160-9-4.8 MCG/ACT AERO Inhale 2 puffs into the lungs in the morning and at bedtime. 10/17/21   Coralyn Helling, MD  Budeson-Glycopyrrol-Formoterol (BREZTRI AEROSPHERE) 160-9-4.8 MCG/ACT AERO Inhale 2 puffs into the lungs in the morning and at bedtime. 10/17/21   Coralyn Helling, MD  dofetilide (TIKOSYN) 500 MCG capsule TAKE 1 CAPSULE BY MOUTH 2 TIMES DAILY. 02/17/22   Camnitz, Andree Coss, MD  fluticasone-salmeterol (ADVAIR DISKUS) 250-50 MCG/ACT AEPB INHALE 1 PUFF EVERY 12 HOURS. RINSE MOUTH AFTER USE. 05/09/22   Coralyn Helling, MD  metoprolol succinate (TOPROL-XL) 100 MG 24 hr tablet TAKE 1 TABLET (100 MG TOTAL) BY MOUTH DAILY. 02/24/22   Camnitz, Will Daphine Deutscher, MD  pantoprazole (PROTONIX) 40 MG tablet Take 1 tablet (40 mg total) by mouth daily. 06/30/21 08/29/21  Dimple Nanas, MD    Physical Exam: Vitals:   06/17/22 0530 06/17/22 0547 06/17/22 0600 06/17/22 0658  BP: 126/68   (!) 123/93  Pulse: 74   75  Resp: 19   20  Temp:  98.2 F (36.8 C)  (!) 97.5 F (36.4 C)  TempSrc:    Oral  SpO2: 93%  95%   Weight:    (!) 140.7 kg  Height:    5\' 6"  (1.676 m)    General:  Appears calm and comfortable and is in NAD. Obese  Eyes:  PERRL, EOMI, normal lids, iris ENT:  grossly normal hearing, lips & tongue, mmm; appropriate dentition Neck:  no LAD, masses or thyromegaly; no carotid bruits, no JVD  Cardiovascular:  RRR, no m/r/g. Non pitting  LE edema.  Respiratory:   CTA bilaterally with no wheezes/rales/rhonchi.  Normal respiratory effort. Abdomen:  soft, NT, ND, NABS Back:   normal alignment, no CVAT Skin:  no rash or induration seen on limited exam Musculoskeletal:  grossly normal tone BUE/BLE, good ROM, no bony abnormality. Negative straight leg test bilaterally strength 5/5 BLE  Lower extremity:  non pitting LE edema.  Limited foot exam with no ulcerations.  2+ distal pulses. Psychiatric:  grossly normal mood and affect, speech fluent and appropriate, AOx3 Neurologic:  CN 2-12 grossly intact, moves all extremities in coordinated fashion, sensation intact   Radiological Exams on Admission: Independently reviewed - see discussion in A/P where applicable  CT Lumbar Spine  Wo Contrast  Result Date: 06/16/2022 CLINICAL DATA:  Low back pain.  Spondyloarthropathy. EXAM: CT LUMBAR SPINE WITHOUT CONTRAST TECHNIQUE: Multidetector CT imaging of the lumbar spine was performed without intravenous contrast administration. Multiplanar CT image reconstructions were also generated. RADIATION DOSE REDUCTION: This exam was performed according to the departmental dose-optimization program which includes automated exposure control, adjustment of the mA and/or kV according to patient size and/or use of iterative reconstruction technique. COMPARISON:  None Available. FINDINGS: Segmentation: 5 lumbar type vertebrae. Alignment: Normal. Vertebrae: No acute fracture or aggressive osseous lesion. Paraspinal and other soft tissues: Negative. Other: None Disc levels: T12-L1: Disc height loss without significant disc bulge, spinal canal or neural foraminal stenosis. L1-L2: No  significant disc bulge, spinal canal or neural foraminal stenosis. L2-L3: No significant disc bulge, spinal canal or neural foraminal stenosis. L3-L4: Disc height loss with mild disc bulge and mild bilateral facet joint arthropathy. Mild lateral recess stenosis bilaterally. No significant neural foraminal stenosis. L4-L5: Broad-based disc bulge with mild lateral recess stenosis bilaterally. Mild bilateral facet joint arthropathy. No significant neural foraminal stenosis. L5-S1: Mild circumferential disc bulge without significant spinal canal or neural foraminal stenosis. Mild bilateral facet joint arthropathy. IMPRESSION: 1. Multilevel degenerative disc disease with mild disc bulge at L3-L4 and L4-L5 with mild lateral recess stenosis bilaterally at L3-L4 and L4-L5. No significant neural foraminal stenosis. 2. Mild bilateral facet joint arthropathy at L3-L4, L4-L5 and L5-S1. 3. No acute fracture or aggressive osseous lesion. Electronically Signed   By: Larose Hires D.O.   On: 06/16/2022 18:09   CT Angio Chest PE W and/or Wo Contrast  Result Date: 06/16/2022 CLINICAL DATA:  Respiratory distress. Clinical concern for pulmonary embolus. Positive D-dimer. EXAM: CT ANGIOGRAPHY CHEST WITH CONTRAST TECHNIQUE: Multidetector CT imaging of the chest was performed using the standard protocol during bolus administration of intravenous contrast. Multiplanar CT image reconstructions and MIPs were obtained to evaluate the vascular anatomy. RADIATION DOSE REDUCTION: This exam was performed according to the departmental dose-optimization program which includes automated exposure control, adjustment of the mA and/or kV according to patient size and/or use of iterative reconstruction technique. CONTRAST:  OMNIPAQUE IOHEXOL 350 MG/ML SOLN COMPARISON:  06/25/2021 FINDINGS: Cardiovascular: The heart size is normal. No substantial pericardial effusion. Mild atherosclerotic calcification is noted in the wall of the thoracic aorta.  Linear filling defect identified in segmental/subsegmental branches to the right lower lobe (image 196/5), in the region of the acute pulmonary embolus seen on the study from 1 year ago. Imaging features today are compatible with chronic fibrinous nonocclusive subsegmental right lower lobe embolus. Similar linear filling defect identified in a segmental left upper lobe branch on image 134/5 in a region of acute embolus seen on the previous exam. No evidence for acute pulmonary embolus on today's study. Mediastinum/Nodes: No mediastinal lymphadenopathy. There is no hilar lymphadenopathy. The esophagus has normal imaging features. There is no axillary lymphadenopathy. Lungs/Pleura: Centrilobular and paraseptal emphysema evident. There is new central ground-glass airspace disease in the anterior right upper lobe (38/6) with patchy and nodular ground-glass and consolidative airspace disease in the inferior right upper lobe. Right middle lobe collapse is similar to prior. Mild bronchiectasis with bronchial wall thickening and peripheral airway impaction in the dependent lower lobes is similar to prior. No substantial pleural effusion. Upper Abdomen: Visualized portion of the upper abdomen is unremarkable. Musculoskeletal: No worrisome lytic or sclerotic osseous abnormality. Review of the MIP images confirms the above findings. IMPRESSION: 1. No CT evidence for acute pulmonary embolus.  2. Linear filling defects in segmental/subsegmental branches to the right lower lobe and left upper lobe in the areas of acute pulmonary embolus seen on the study from 1 year ago. Imaging features today are compatible with chronic fibrinous nonocclusive residual embolus. 3. New central ground-glass airspace disease in the anterior right upper lobe with patchy and nodular ground-glass and consolidative airspace disease in the inferior right upper lobe. Imaging features are compatible with multifocal infectious/inflammatory etiology. 4. Right  middle lobe collapse is similar to prior. 5. Mild bronchiectasis with bronchial wall thickening and peripheral airway impaction in the dependent lower lobes is similar to prior. 6. Aortic Atherosclerosis (ICD10-I70.0) and Emphysema (ICD10-J43.9). Electronically Signed   By: Kennith Center M.D.   On: 06/16/2022 18:00   DG Chest Portable 1 View  Result Date: 06/16/2022 CLINICAL DATA:  Shortness of breath EXAM: PORTABLE CHEST 1 VIEW COMPARISON:  X-ray 06/25/2021 and older.  CT angiogram 06/25/2021. FINDINGS: Under penetrated radiograph with overlapping cardiac leads. Hyperinflation. Stable cardiopericardial silhouette. Mild left basilar atelectasis. The right inferior costophrenic angle is clipped off the edge of the film. No pneumothorax or effusion. No edema. IMPRESSION: Under penetrated radiograph. Hyperinflation. Stable cardiopericardial silhouette. Left-sided basilar atelectasis or scar Electronically Signed   By: Karen Kays M.D.   On: 06/16/2022 15:11    EKG: Independently reviewed.  NSR with rate 82; nonspecific ST changes with no evidence of acute ischemia Prolonged qt   Labs on Admission: I have personally reviewed the available labs and imaging studies at the time of the admission.  Pertinent labs:   wbc: 10.8,  bnp: 155,  d-dimer: .77,  covid negative   Assessment and Plan: Principal Problem:   Acute on chronic respiratory failure with hypoxia (HCC) Active Problems:   Acute on chronic diastolic CHF (congestive heart failure) (HCC)   Multifocal pneumonia   Prolonged QT interval   Low back pain   COPD (chronic obstructive pulmonary disease) (HCC)   Generalized anxiety disorder   Persistent atrial fibrillation (HCC)   PE (pulmonary thromboembolism) (HCC)   CKD (chronic kidney disease), stage II   OSA (obstructive sleep apnea)    Assessment and Plan: * Acute on chronic respiratory failure with hypoxia (HCC) 62 year old male presenting with chronic back pain and some shortness  of breath found to be hypoxic to 81% on room air requiring 8L HFNC to maintain oxygenation likely multifactorial in etiology  -obs to tele -CXR stable and CTA shows no acute PE, but does show collapsed right middle lobe similar to prior, new central ground-glass airspace disease in the anterior right upper lobe with patchy and nodular ground-glass and consolidative airspace disease in the inferior right upper lobe. Imaging features are compatible with multifocal infectious/inflammatory etiology.  -treating possible pneumonia -IS to bedside for lung collapse -mild bronchiectasis stable, likely not contributing  -acute on chronic diastolic CHF-diurese -hx of COPD, but no evidence of exacerbation  -hx of afib with high burden of 54% on monitor, in NSR today. Continue to monitor on telemetry  -back pain -anxiety  -wean back to baseline as tolerated  -start back cpap, has not used x 1 year  -consult pulm if no improvement   Acute on chronic diastolic CHF (congestive heart failure) (HCC) He has had dyspnea on exertion, weight gain and elevated BNP concerning for acute on chronic diastolic CHF -last echo: 02/2022 with normal EF grade 1 DD -check limited echo  -give 1 dose of IV lasix 40mg  and follow output. Titrate as needed  -  strict I/o and daily weights   Multifocal pneumonia CTA chest showing new central ground-glass airspace disease in the anterior right upper lobe with patchy and nodular ground-glass and consolidative airspace disease in the inferior right upper lobe. Imaging features are compatible with multifocal infectious/inflammatory etiology. -continue rocephin, doxy with prolonged QT -check and trend PCT -check RVP -only minimal leukocytosis  -sputum cx  -covid negative  -trend CBC -SABA prn   Prolonged QT interval Optimize electrolytes Keep on telemetry Avoid qt prolonging drugs  Repeat ekg in AM    Low back pain DDD with mild disc bulge at L3-L4 and L4-L5 with mild  lateral recess stenosis bilaterally at L3-L4 and L4-L5. No significant neural foraminal stenosis.  -no acute findings or red flags on exam  -weight loss and PT would be beneficial -will do lidocaine patch and PRN tramadol for now -trial gabapentin  -outpatient f/u   COPD (chronic obstructive pulmonary disease) (HCC) No signs of exacerbation on exam. No wheezing.  PFT in 2023: severe obstruction, positive bronchodilator response, air trapping, and moderate diffusion defect.  -continue Bretzi BID, singulair and SABA prn   Generalized anxiety disorder Start hydroxyzine PRN Outpatient management with PCP   Persistent atrial fibrillation (HCC) CHA2DS2Vasc is 3, on Eliquis -hx of afib with high burden of 54% on monitor, in NSR today. Continue to monitor on telemetry  NSR, continue eliquis, tikosyn and toprol XL   PE (pulmonary thromboembolism) (HCC) No acute PE on CTA Continue eliquis BID   CKD (chronic kidney disease), stage II Stable, continue to monitor   OSA (obstructive sleep apnea) Has been off of his cpap x 1 year, likely contributing to this  Continue cpap at night    Advance Care Planning:   Code Status: Full Code   Consults: PT  DVT Prophylaxis: eliquis   Family Communication: none   Severity of Illness: The appropriate patient status for this patient is OBSERVATION. Observation status is judged to be reasonable and necessary in order to provide the required intensity of service to ensure the patient's safety. The patient's presenting symptoms, physical exam findings, and initial radiographic and laboratory data in the context of their medical condition is felt to place them at decreased risk for further clinical deterioration. Furthermore, it is anticipated that the patient will be medically stable for discharge from the hospital within 2 midnights of admission.   Author: Orland Mustard, MD 06/17/2022 1:43 PM  For on call review [email or web address].

## 2022-06-17 NOTE — Assessment & Plan Note (Addendum)
Chronic issue Exam with no red flags CT lumbar spine with no acute findings, DDD Start lidocaine patch  Tramadol prn  Never has tried gabapentin, trial this here.  PT eval

## 2022-06-17 NOTE — Assessment & Plan Note (Addendum)
Continue Cpap at home.   Obesity class 3, with calculated BMI of 48,2

## 2022-06-17 NOTE — Assessment & Plan Note (Addendum)
DDD with mild disc bulge at L3-L4 and L4-L5 with mild lateral recess stenosis bilaterally at L3-L4 and L4-L5. No significant neural foraminal stenosis.   Patient has been placed on gabapentin with good toleration. Will recommended home health services to continue physical therapy.

## 2022-06-17 NOTE — Assessment & Plan Note (Addendum)
Hyponatremia.   At the time of his discharge his renal function has a serum cr of 1,22 with K at 3,9 and serum bicarbonate at 34. Na is 134.  Plan to continue diuresis with torsemide, spironolactone and empagliflozin. .  Follow up renal function as outpatient.,

## 2022-06-17 NOTE — Assessment & Plan Note (Addendum)
No acute PE on CTA Continue eliquis BID

## 2022-06-18 ENCOUNTER — Inpatient Hospital Stay (HOSPITAL_COMMUNITY): Payer: Medicaid Other

## 2022-06-18 DIAGNOSIS — I5031 Acute diastolic (congestive) heart failure: Secondary | ICD-10-CM | POA: Diagnosis not present

## 2022-06-18 DIAGNOSIS — J9621 Acute and chronic respiratory failure with hypoxia: Secondary | ICD-10-CM | POA: Diagnosis not present

## 2022-06-18 LAB — CBC
HCT: 43.9 % (ref 39.0–52.0)
Hemoglobin: 13.8 g/dL (ref 13.0–17.0)
MCH: 30.2 pg (ref 26.0–34.0)
MCHC: 31.4 g/dL (ref 30.0–36.0)
MCV: 96.1 fL (ref 80.0–100.0)
Platelets: 210 10*3/uL (ref 150–400)
RBC: 4.57 MIL/uL (ref 4.22–5.81)
RDW: 14.4 % (ref 11.5–15.5)
WBC: 7.4 10*3/uL (ref 4.0–10.5)
nRBC: 0 % (ref 0.0–0.2)

## 2022-06-18 LAB — PROCALCITONIN: Procalcitonin: <0.1 ng/mL

## 2022-06-18 LAB — ECHOCARDIOGRAM COMPLETE
AR max vel: 3.52 cm2
AV Area VTI: 3.73 cm2
AV Area mean vel: 3.43 cm2
AV Mean grad: 2 mmHg
AV Peak grad: 3 mmHg
Ao pk vel: 0.87 m/s
Area-P 1/2: 3.31 cm2
Height: 66 in
S' Lateral: 2.8 cm
Weight: 4924.19 oz

## 2022-06-18 LAB — BASIC METABOLIC PANEL
Anion gap: 10 (ref 5–15)
BUN: 24 mg/dL — ABNORMAL HIGH (ref 6–20)
CO2: 36 mmol/L — ABNORMAL HIGH (ref 22–32)
Calcium: 8.7 mg/dL — ABNORMAL LOW (ref 8.9–10.3)
Chloride: 90 mmol/L — ABNORMAL LOW (ref 98–111)
Creatinine, Ser: 1.18 mg/dL (ref 0.61–1.24)
GFR, Estimated: >60 mL/min (ref >60)
Glucose, Bld: 113 mg/dL — ABNORMAL HIGH (ref 70–99)
Potassium: 3.9 mmol/L (ref 3.5–5.1)
Sodium: 136 mmol/L (ref 135–145)

## 2022-06-18 MED ORDER — EMPAGLIFLOZIN 10 MG PO TABS
10.0000 mg | ORAL_TABLET | Freq: Every day | ORAL | Status: DC
  Administered 2022-06-18 – 2022-06-23 (×6): 10 mg via ORAL
  Filled 2022-06-18 (×6): qty 1

## 2022-06-18 MED ORDER — PERFLUTREN LIPID MICROSPHERE
1.0000 mL | INTRAVENOUS | Status: AC | PRN
  Administered 2022-06-18: 2 mL via INTRAVENOUS

## 2022-06-18 MED ORDER — METOPROLOL TARTRATE 5 MG/5ML IV SOLN
5.0000 mg | Freq: Once | INTRAVENOUS | Status: AC
  Administered 2022-06-18: 5 mg via INTRAVENOUS
  Filled 2022-06-18: qty 5

## 2022-06-18 MED ORDER — POTASSIUM CHLORIDE CRYS ER 20 MEQ PO TBCR
40.0000 meq | EXTENDED_RELEASE_TABLET | Freq: Once | ORAL | Status: AC
  Administered 2022-06-18: 40 meq via ORAL
  Filled 2022-06-18: qty 2

## 2022-06-18 MED ORDER — FUROSEMIDE 10 MG/ML IJ SOLN
40.0000 mg | Freq: Two times a day (BID) | INTRAMUSCULAR | Status: DC
  Administered 2022-06-18 – 2022-06-19 (×3): 40 mg via INTRAVENOUS
  Filled 2022-06-18 (×2): qty 4

## 2022-06-18 MED ORDER — CEFDINIR 300 MG PO CAPS
300.0000 mg | ORAL_CAPSULE | Freq: Two times a day (BID) | ORAL | Status: DC
  Administered 2022-06-18 – 2022-06-19 (×3): 300 mg via ORAL
  Filled 2022-06-18 (×3): qty 1

## 2022-06-18 NOTE — Plan of Care (Signed)
  Problem: Nutrition: Goal: Adequate nutrition will be maintained Outcome: Completed/Met   Problem: Coping: Goal: Level of anxiety will decrease Outcome: Completed/Met   

## 2022-06-18 NOTE — Progress Notes (Signed)
Mobility Specialist Progress Note   06/18/22 1535  Mobility  Activity Ambulated independently in hallway;Dangled on edge of bed  Level of Assistance Modified independent, requires aide device or extra time  Assistive Device None  Distance Ambulated (ft) 380 ft  Range of Motion/Exercises Active;All extremities  Activity Response Tolerated well   Patient received in supine and agreeable to participate. Ambulated independently with slow steady gait. Required standing rest breaks x3 secondary to fatigue and SOB. Returned to room without complaint or incident. Provided IS per request of patient. Was left EOB with all needs met, call bell in reach.   [place] Albena Comes, BS EXP Mobility Specialist Please contact via SecureChat or Rehab office at [phone]

## 2022-06-18 NOTE — Progress Notes (Signed)
PROGRESS NOTE    STONEWALL CATTANI  ZOX:[id number] DOB: [birth year] DOA: 06/16/2022 PCP: Kaleen Mask, MD  62/M with history of COPD, chronic respiratory failure on 2 L home O2, paroxysmal A-fib on Eliquis, sleep apnea, obesity, CKD 2, presented to the ED with progressive shortness of breath and edema X 2 to 3 weeks, prescribed Lasix by his PCP few weeks ago. -In the ED hypoxic, placed on 6 L high flow nasal cannula, labs with BNP 155, WBC 10.8, D-dimer 0.7, CTA chest with chronic residual fibrinous nonocclusive thrombus, new central groundglass opacities in anterior upper lobe, inferior upper lobe concerning for multifocal infectious/inflammatory process   Subjective: -Still short of breath, breathing a little better  Assessment and Plan:  Acute on chronic respiratory failure with hypoxia  -Suspect primarily secondary to CHF, grossly volume overloaded, low clinical suspicion for pneumonia, in the background has COPD as well -Diuretics as below, follow-up echo  Acute CHF -Bilateral groundglass opacities noted on CT chest, clinically more consistent with CHF --Prior echo 1/24 with preserved EF, grade 1 diastolic dysfunction, normal RV -Repeat echo, done this morning, will follow-up -Continue IV Lasix today, add Jardiance -Monitor I's/O, daily weights -Needs to lose weight, discussed importance of dietary changes as well  Untreated sleep apnea Morbid obesity -Reportedly getting a new CPAP machine, intolerant to previous -Needs weight loss and lifestyle modification -Resume CPAP  ?  Pneumonia -Low clinical suspicion, no fever or leukocytosis, PCT <0.1 -Discontinue ceftriaxone and doxycycline, changed to oral cefdinir for 4 days, may discontinue antibiotics in 1 to 2 days depending on clinical course  Prolonged QT interval -Monitor electrolytes, avoid QT prolonging meds  Chronic back pain -Started on lidocaine patch and gabapentin  COPD (chronic obstructive pulmonary  disease) (HCC) No signs of exacerbation at this time PFT in 2023: severe obstruction, positive bronchodilator response, air trapping, and moderate diffusion defect.  -continue Bretzi BID, singulair and SABA prn   Generalized anxiety disorder Start hydroxyzine PRN Outpatient management with PCP   Persistent atrial fibrillation (HCC) CHA2DS2Vasc is 3, on Eliquis -hx of afib with high burden of 54% on monitor, in NSR today. -NSR, continue eliquis, tikosyn and toprol XL   PE (pulmonary thromboembolism) (HCC) No acute PE on CTA, chronic residual fibrinous thrombus noted Continue eliquis BID   CKD (chronic kidney disease), stage II Stable, continue to monitor   DVT prophylaxis: Eliquis Code Status: Full code Family Communication: None present Disposition Plan: Home likely 2 to 3 days  Consultants:    Procedures:   Antimicrobials:    Objective: Vitals:   06/18/22 0503 06/18/22 0729 06/18/22 0858 06/18/22 0859  BP: 113/69 125/65    Pulse: 75 81    Resp: 18 19    Temp: 97.8 F (36.6 C) 97.9 F (36.6 C)    TempSrc: Oral     SpO2: 93% (!) 86% 92% 92%  Weight: (!) 139.6 kg     Height:        Intake/Output Summary (Last 24 hours) at 06/18/2022 0948 Last data filed at 06/18/2022 0900 Gross per 24 hour  Intake 580 ml  Output 650 ml  Net -70 ml   Filed Weights   06/16/22 1445 06/17/22 0658 06/18/22 0503  Weight: 136.1 kg (!) 140.7 kg (!) 139.6 kg    Examination:  General exam: Obese chronically ill male sitting up in bed, AAOx3, no distress HEENT: Neck obese unable to assess JVD CVS: S1-S2, regular rhythm Lungs: Bilateral Rales, decreased at the bases Abdomen: Soft, nontender,  bowel sounds present Extremities: 2+ edema Skin: No rashes Psychiatry:  Mood & affect appropriate.     Data Reviewed:   CBC: Recent Labs  Lab 06/16/22 1505 06/18/22 0042  WBC 10.8* 7.4  NEUTROABS 9.1*  --   HGB 15.8 13.8  HCT 48.2 43.9  MCV 93.2 96.1  PLT 265 210   Basic  Metabolic Panel: Recent Labs  Lab 06/16/22 1505 06/17/22 1046 06/18/22 0042  NA 136  --  136  K 4.3  --  3.9  CL 93*  --  90*  CO2 33*  --  36*  GLUCOSE 93  --  113*  BUN 24*  --  24*  CREATININE 1.18  --  1.18  CALCIUM 8.4*  --  8.7*  MG  --  2.2  --    GFR: Estimated Creatinine Clearance: 88.6 mL/min (by C-G formula based on SCr of 1.18 mg/dL). Liver Function Tests: Recent Labs  Lab 06/16/22 1505  AST 32  ALT 23  ALKPHOS 56  BILITOT 0.9  PROT 7.5  ALBUMIN 3.7   No results for input(s): "LIPASE", "AMYLASE" in the last 168 hours. No results for input(s): "AMMONIA" in the last 168 hours. Coagulation Profile: No results for input(s): "INR", "PROTIME" in the last 168 hours. Cardiac Enzymes: No results for input(s): "CKTOTAL", "CKMB", "CKMBINDEX", "TROPONINI" in the last 168 hours. BNP (last 3 results) Recent Labs    08/26/21 1137 02/10/22 1146  PROBNP 54.0 504*   HbA1C: No results for input(s): "HGBA1C" in the last 72 hours. CBG: No results for input(s): "GLUCAP" in the last 168 hours. Lipid Profile: No results for input(s): "CHOL", "HDL", "LDLCALC", "TRIG", "CHOLHDL", "LDLDIRECT" in the last 72 hours. Thyroid Function Tests: Recent Labs    06/17/22 1046 06/17/22 1510  TSH 6.189*  --   FREET4  --  0.79   Anemia Panel: No results for input(s): "VITAMINB12", "FOLATE", "FERRITIN", "TIBC", "IRON", "RETICCTPCT" in the last 72 hours. Urine analysis:    Component Value Date/Time   COLORURINE YELLOW 06/25/2021 1618   APPEARANCEUR CLEAR 06/25/2021 1618   LABSPEC 1.025 06/25/2021 1618   PHURINE 6.5 06/25/2021 1618   GLUCOSEU 100 (A) 06/25/2021 1618   HGBUR NEGATIVE 06/25/2021 1618   BILIRUBINUR NEGATIVE 06/25/2021 1618   KETONESUR NEGATIVE 06/25/2021 1618   PROTEINUR NEGATIVE 06/25/2021 1618   NITRITE NEGATIVE 06/25/2021 1618   LEUKOCYTESUR NEGATIVE 06/25/2021 1618   Sepsis Labs: @LABRCNTIP (procalcitonin:4,lacticidven:4)  ) Recent Results (from the past  240 hour(s))  SARS Coronavirus 2 by RT PCR (hospital order, performed in [facility] hospital lab) *cepheid single result test* Anterior Nasal Swab     Status: None   Collection Time: 06/16/22  3:05 PM   Specimen: Anterior Nasal Swab  Result Value Ref Range Status   SARS Coronavirus 2 by RT PCR NEGATIVE NEGATIVE Final    Comment: (NOTE) SARS-CoV-2 target nucleic acids are NOT DETECTED.  The SARS-CoV-2 RNA is generally detectable in upper and lower respiratory specimens during the acute phase of infection. The lowest concentration of SARS-CoV-2 viral copies this assay can detect is 250 copies / mL. A negative result does not preclude SARS-CoV-2 infection and should not be used as the sole basis for treatment or other patient management decisions.  A negative result may occur with improper specimen collection / handling, submission of specimen other than nasopharyngeal swab, presence of viral mutation(s) within the areas targeted by this assay, and inadequate number of viral copies (<250 copies / mL). A negative result must be  combined with clinical observations, patient history, and epidemiological information.  Fact Sheet for Patients:   RoadLapTop.co.za  Fact Sheet for Healthcare Providers: http://kim-miller.com/  This test is not yet approved or  cleared by the [place] FDA and has been authorized for detection and/or diagnosis of SARS-CoV-2 by FDA under an Emergency Use Authorization (EUA).  This EUA will remain in effect (meaning this test can be used) for the duration of the COVID-19 declaration under Section 564(b)(1) of the Act, 21 U.S.C. section 360bbb-3(b)(1), unless the authorization is terminated or revoked sooner.  Performed at [facility], [address], [place], [place] [address]      Radiology Studies: CT Lumbar Spine Wo Contrast  Result Date: 06/16/2022 CLINICAL DATA:  Low back pain.   Spondyloarthropathy. EXAM: CT LUMBAR SPINE WITHOUT CONTRAST TECHNIQUE: Multidetector CT imaging of the lumbar spine was performed without intravenous contrast administration. Multiplanar CT image reconstructions were also generated. RADIATION DOSE REDUCTION: This exam was performed according to the departmental dose-optimization program which includes automated exposure control, adjustment of the mA and/or kV according to patient size and/or use of iterative reconstruction technique. COMPARISON:  None Available. FINDINGS: Segmentation: 5 lumbar type vertebrae. Alignment: Normal. Vertebrae: No acute fracture or aggressive osseous lesion. Paraspinal and other soft tissues: Negative. Other: None Disc levels: T12-L1: Disc height loss without significant disc bulge, spinal canal or neural foraminal stenosis. L1-L2: No significant disc bulge, spinal canal or neural foraminal stenosis. L2-L3: No significant disc bulge, spinal canal or neural foraminal stenosis. L3-L4: Disc height loss with mild disc bulge and mild bilateral facet joint arthropathy. Mild lateral recess stenosis bilaterally. No significant neural foraminal stenosis. L4-L5: Broad-based disc bulge with mild lateral recess stenosis bilaterally. Mild bilateral facet joint arthropathy. No significant neural foraminal stenosis. L5-S1: Mild circumferential disc bulge without significant spinal canal or neural foraminal stenosis. Mild bilateral facet joint arthropathy. IMPRESSION: 1. Multilevel degenerative disc disease with mild disc bulge at L3-L4 and L4-L5 with mild lateral recess stenosis bilaterally at L3-L4 and L4-L5. No significant neural foraminal stenosis. 2. Mild bilateral facet joint arthropathy at L3-L4, L4-L5 and L5-S1. 3. No acute fracture or aggressive osseous lesion. Electronically Signed   By: Larose Hires D.O.   On: 06/16/2022 18:09   CT Angio Chest PE W and/or Wo Contrast  Result Date: 06/16/2022 CLINICAL DATA:  Respiratory distress. Clinical  concern for pulmonary embolus. Positive D-dimer. EXAM: CT ANGIOGRAPHY CHEST WITH CONTRAST TECHNIQUE: Multidetector CT imaging of the chest was performed using the standard protocol during bolus administration of intravenous contrast. Multiplanar CT image reconstructions and MIPs were obtained to evaluate the vascular anatomy. RADIATION DOSE REDUCTION: This exam was performed according to the departmental dose-optimization program which includes automated exposure control, adjustment of the mA and/or kV according to patient size and/or use of iterative reconstruction technique. CONTRAST:  OMNIPAQUE IOHEXOL 350 MG/ML SOLN COMPARISON:  06/25/2021 FINDINGS: Cardiovascular: The heart size is normal. No substantial pericardial effusion. Mild atherosclerotic calcification is noted in the wall of the thoracic aorta. Linear filling defect identified in segmental/subsegmental branches to the right lower lobe (image 196/5), in the region of the acute pulmonary embolus seen on the study from 1 year ago. Imaging features today are compatible with chronic fibrinous nonocclusive subsegmental right lower lobe embolus. Similar linear filling defect identified in a segmental left upper lobe branch on image 134/5 in a region of acute embolus seen on the previous exam. No evidence for acute pulmonary embolus on today's study. Mediastinum/Nodes: No  mediastinal lymphadenopathy. There is no hilar lymphadenopathy. The esophagus has normal imaging features. There is no axillary lymphadenopathy. Lungs/Pleura: Centrilobular and paraseptal emphysema evident. There is new central ground-glass airspace disease in the anterior right upper lobe (38/6) with patchy and nodular ground-glass and consolidative airspace disease in the inferior right upper lobe. Right middle lobe collapse is similar to prior. Mild bronchiectasis with bronchial wall thickening and peripheral airway impaction in the dependent lower lobes is similar to prior. No  substantial pleural effusion. Upper Abdomen: Visualized portion of the upper abdomen is unremarkable. Musculoskeletal: No worrisome lytic or sclerotic osseous abnormality. Review of the MIP images confirms the above findings. IMPRESSION: 1. No CT evidence for acute pulmonary embolus. 2. Linear filling defects in segmental/subsegmental branches to the right lower lobe and left upper lobe in the areas of acute pulmonary embolus seen on the study from 1 year ago. Imaging features today are compatible with chronic fibrinous nonocclusive residual embolus. 3. New central ground-glass airspace disease in the anterior right upper lobe with patchy and nodular ground-glass and consolidative airspace disease in the inferior right upper lobe. Imaging features are compatible with multifocal infectious/inflammatory etiology. 4. Right middle lobe collapse is similar to prior. 5. Mild bronchiectasis with bronchial wall thickening and peripheral airway impaction in the dependent lower lobes is similar to prior. 6. Aortic Atherosclerosis (ICD10-I70.0) and Emphysema (ICD10-J43.9). Electronically Signed   By: Kennith Center M.D.   On: 06/16/2022 18:00   DG Chest Portable 1 View  Result Date: 06/16/2022 CLINICAL DATA:  Shortness of breath EXAM: PORTABLE CHEST 1 VIEW COMPARISON:  X-ray 06/25/2021 and older.  CT angiogram 06/25/2021. FINDINGS: Under penetrated radiograph with overlapping cardiac leads. Hyperinflation. Stable cardiopericardial silhouette. Mild left basilar atelectasis. The right inferior costophrenic angle is clipped off the edge of the film. No pneumothorax or effusion. No edema. IMPRESSION: Under penetrated radiograph. Hyperinflation. Stable cardiopericardial silhouette. Left-sided basilar atelectasis or scar Electronically Signed   By: Karen Kays M.D.   On: 06/16/2022 15:11     Scheduled Meds:  apixaban  5 mg Oral BID   dofetilide  500 mcg Oral BID   doxycycline  100 mg Oral Q12H   empagliflozin  10 mg Oral  Daily   furosemide  40 mg Intravenous BID   metoprolol succinate  25 mg Oral BID WC   mometasone-formoterol  2 puff Inhalation BID   And   umeclidinium bromide  1 puff Inhalation Daily   montelukast  10 mg Oral QHS   Continuous Infusions:  cefTRIAXone (ROCEPHIN)  IV Stopped (06/17/22 1232)     LOS: 1 day    Time spent:    Zannie Cove, MD [facility]   06/18/2022, 9:48 AM

## 2022-06-18 NOTE — Progress Notes (Signed)
Pt converted to Afib, pt does have a hx of it ECG confirmed it;  MD notified and ordered IV Metoprolol 5mg .  Shortly after this pt's HR went to the 90-100's, will continue to monitor, Thanks Lavonda Jumbo RN.

## 2022-06-19 DIAGNOSIS — J9621 Acute and chronic respiratory failure with hypoxia: Secondary | ICD-10-CM | POA: Diagnosis not present

## 2022-06-19 DIAGNOSIS — I509 Heart failure, unspecified: Secondary | ICD-10-CM | POA: Insufficient documentation

## 2022-06-19 LAB — BASIC METABOLIC PANEL
Anion gap: 10 (ref 5–15)
BUN: 23 mg/dL — ABNORMAL HIGH (ref 6–20)
CO2: 35 mmol/L — ABNORMAL HIGH (ref 22–32)
Calcium: 8.7 mg/dL — ABNORMAL LOW (ref 8.9–10.3)
Chloride: 89 mmol/L — ABNORMAL LOW (ref 98–111)
Creatinine, Ser: 1.13 mg/dL (ref 0.61–1.24)
GFR, Estimated: >60 mL/min (ref >60)
Glucose, Bld: 135 mg/dL — ABNORMAL HIGH (ref 70–99)
Potassium: 3.9 mmol/L (ref 3.5–5.1)
Sodium: 134 mmol/L — ABNORMAL LOW (ref 135–145)

## 2022-06-19 LAB — CBC
HCT: 44.4 % (ref 39.0–52.0)
Hemoglobin: 14.3 g/dL (ref 13.0–17.0)
MCH: 30.3 pg (ref 26.0–34.0)
MCHC: 32.2 g/dL (ref 30.0–36.0)
MCV: 94.1 fL (ref 80.0–100.0)
Platelets: 213 10*3/uL (ref 150–400)
RBC: 4.72 MIL/uL (ref 4.22–5.81)
RDW: 14.1 % (ref 11.5–15.5)
WBC: 8.8 10*3/uL (ref 4.0–10.5)
nRBC: 0 % (ref 0.0–0.2)

## 2022-06-19 MED ORDER — FUROSEMIDE 10 MG/ML IJ SOLN
60.0000 mg | Freq: Two times a day (BID) | INTRAMUSCULAR | Status: DC
  Administered 2022-06-19: 60 mg via INTRAVENOUS
  Filled 2022-06-19: qty 6

## 2022-06-19 MED ORDER — POTASSIUM CHLORIDE CRYS ER 20 MEQ PO TBCR
40.0000 meq | EXTENDED_RELEASE_TABLET | Freq: Once | ORAL | Status: AC
  Administered 2022-06-19: 40 meq via ORAL
  Filled 2022-06-19: qty 2

## 2022-06-19 MED ORDER — SALINE SPRAY 0.65 % NA SOLN
1.0000 | NASAL | Status: DC | PRN
  Administered 2022-06-19: 1 via NASAL
  Filled 2022-06-19: qty 44

## 2022-06-19 MED ORDER — SPIRONOLACTONE 12.5 MG HALF TABLET
12.5000 mg | ORAL_TABLET | Freq: Every day | ORAL | Status: DC
  Administered 2022-06-19 – 2022-06-23 (×5): 12.5 mg via ORAL
  Filled 2022-06-19 (×5): qty 1

## 2022-06-19 NOTE — Progress Notes (Signed)
Physical Therapy Treatment Patient Details Name: [name] [name] MRN: [id number] DOB: 1961-01-10 Today's Date: 06/19/2022   History of Present Illness The pt is a 62 yo male presenting 5/10 with SOB. Found to have COPD exacerbation with hypoxia. PMH includes: CHF, COPD, HTN, obesity, OSA, and afib.    PT Comments    Pt tolerated treatment well today. Pt was able to ambulate in hallway independently however O2 sats continue to drop with ambulation. Pt received on 6L at 97%. Pt dropped to 80% on 6L during ambulation. Pt titrated to 10L and recovered to 97% with pursed lip breathing after around 3 minutes. Pt left on 6L at 98%. No change in DC/DME recs at this time. PT will continue to follow.    Recommendations for follow up therapy are one component of a multi-disciplinary discharge planning process, led by the attending physician.  Recommendations may be updated based on patient status, additional functional criteria and insurance authorization.  Follow Up Recommendations       Assistance Recommended at Discharge Frequent or constant Supervision/Assistance  Patient can return home with the following Help with stairs or ramp for entrance   Equipment Recommendations  None recommended by PT    Recommendations for Other Services       Precautions / Restrictions Precautions Precautions: Fall Precaution Comments: watch O2, on 8L for eval Restrictions Weight Bearing Restrictions: No     Mobility  Bed Mobility Overal bed mobility: Independent             General bed mobility comments: Pt left seated EOB    Transfers Overall transfer level: Independent Equipment used: None Transfers: Sit to/from Stand Sit to Stand: Independent                Ambulation/Gait Ambulation/Gait assistance: Independent Gait Distance (Feet): 450 Feet Assistive device: None Gait Pattern/deviations: Decreased stride length Gait velocity: decreased     General Gait Details: no LOB  noted.   Stairs             Wheelchair Mobility    Modified Rankin (Stroke Patients Only)       Balance Overall balance assessment: Mild deficits observed, not formally tested                                          Cognition Arousal/Alertness: Awake/alert Behavior During Therapy: WFL for tasks assessed/performed Overall Cognitive Status: Within Functional Limits for tasks assessed                                          Exercises      General Comments General comments (skin integrity, edema, etc.): Pt received on 6L at 97%. Pt dropped to 80% on 6L during ambulation. Pt titrated to 10L and recovered to 97% with pursed lip breathing after around 3 minutes. Pt left on 6L at 98%.      Pertinent Vitals/Pain Pain Assessment Pain Assessment: No/denies pain    Home Living                          Prior Function            PT Goals (current goals can now be found in the care plan section) Progress towards PT goals:  Progressing toward goals    Frequency    Min 1X/week      PT Plan Current plan remains appropriate    Co-evaluation              AM-PAC PT "6 Clicks" Mobility   Outcome Measure  Help needed turning from your back to your side while in a flat bed without using bedrails?: None Help needed moving from lying on your back to sitting on the side of a flat bed without using bedrails?: None Help needed moving to and from a bed to a chair (including a wheelchair)?: A Little Help needed standing up from a chair using your arms (e.g., wheelchair or bedside chair)?: A Little Help needed to walk in hospital room?: A Little Help needed climbing 3-5 steps with a railing? : A Little 6 Click Score: 20    End of Session Equipment Utilized During Treatment: Oxygen;Gait belt Activity Tolerance: Patient tolerated treatment well Patient left: in bed;with call bell/phone within reach (Seated EOB) Nurse  Communication: Mobility status PT Visit Diagnosis: Other abnormalities of gait and mobility (R26.89)     Time: 1450-1503 PT Time Calculation (min) (ACUTE ONLY): 13 min  Charges:  $Gait Training: 8-22 mins                     Shela Nevin, PT, DPT Acute Rehab Services [phone]    Gladys Damme 06/19/2022, 4:06 PM

## 2022-06-19 NOTE — Progress Notes (Signed)
Patient refusing cpap at this time

## 2022-06-19 NOTE — Plan of Care (Signed)
  Problem: Education: Goal: Knowledge of General Education information will improve Description: Including pain rating scale, medication(s)/side effects and non-pharmacologic comfort measures Outcome: Progressing   Problem: Clinical Measurements: Goal: Ability to maintain clinical measurements within normal limits will improve Outcome: Progressing Goal: Will remain free from infection Outcome: Progressing   

## 2022-06-19 NOTE — Progress Notes (Signed)
Mobility Specialist Progress Note:    06/19/22 1200  Mobility  Activity Ambulated with assistance in hallway  Level of Assistance Standby assist, set-up cues, supervision of patient - no hands on  Assistive Device None  Distance Ambulated (ft) 500 ft  Activity Response Tolerated well  Mobility Referral Yes  $Mobility charge 1 Mobility  Mobility Specialist Start Time (ACUTE ONLY) 1210  Mobility Specialist Stop Time (ACUTE ONLY) 1220  Mobility Specialist Time Calculation (min) (ACUTE ONLY) 10 min   Pt received in chair, eager to ambulate. On 6L O2 during ambulation, c/o SOB. Unable to obtain reliable SpO2 reading during, 92% on 6LO2 post-ambulation. Pt left sitting EOB with all needs met.   Thompson Grayer Mobility Specialist  Please contact vis Secure Chat or  Rehab Office [phone]

## 2022-06-19 NOTE — Progress Notes (Signed)
PROGRESS NOTE    [name] [name]  ZOX:[id number] DOB: 1960-12-08 DOA: 06/16/2022 PCP: Kaleen Mask, MD  62/M with history of COPD, chronic respiratory failure on 2 L home O2, paroxysmal A-fib on Eliquis, sleep apnea, obesity, CKD 2, presented to the ED with progressive shortness of breath and edema X 2 to 3 weeks, prescribed Lasix by his PCP few weeks ago. -In the ED hypoxic, placed on 6 L high flow nasal cannula, labs with BNP 155, WBC 10.8, D-dimer 0.7, CTA chest with chronic residual fibrinous nonocclusive thrombus, new central groundglass opacities in anterior upper lobe, inferior upper lobe concerning for multifocal infectious/inflammatory process   Subjective: -Feels better, breathing slowly improving  Assessment and Plan:  Acute on chronic respiratory failure with hypoxia  -Suspect primarily secondary to CHF, grossly volume overloaded, low clinical suspicion for pneumonia, in the background has COPD as well -Diuretics as below  Acute diastolic CHF -Bilateral groundglass opacities noted on CT chest, clinically more consistent with CHF -Echo with EF of 60%, mildly reduced RV -Diuresing 2.9 L negative, continue IV Lasix, Jardiance -Will add Aldactone -Monitor I's/O, daily weights -Needs to lose weight, discussed importance of dietary changes as well  Untreated sleep apnea Morbid obesity -Reportedly getting a new CPAP machine, intolerant to previous -Needs weight loss and lifestyle modification -Resume CPAP  ?  Pneumonia -Low clinical suspicion, no fever or leukocytosis, PCT <0.1 -Discontinue antibiotics  Prolonged QT interval -Monitor electrolytes, avoid QT prolonging meds  Chronic back pain -Started on lidocaine patch and gabapentin  COPD (chronic obstructive pulmonary disease) (HCC) No signs of exacerbation at this time PFT in 2023: severe obstruction, positive bronchodilator response, air trapping, and moderate diffusion defect.  -continue Bretzi BID,  singulair and SABA prn   Generalized anxiety disorder Start hydroxyzine PRN Outpatient management with PCP   Persistent atrial fibrillation (HCC) CHA2DS2Vasc is 3, on Eliquis -hx of afib with high burden of 54% on monitor, in NSR today. -NSR, continue eliquis, tikosyn and toprol XL   PE (pulmonary thromboembolism) (HCC) No acute PE on CTA, chronic residual fibrinous thrombus noted Continue eliquis BID   CKD (chronic kidney disease), stage II Stable, continue to monitor   DVT prophylaxis: Eliquis Code Status: Full code Family Communication: None present Disposition Plan: Home likely 2 to 3 days  Consultants:    Procedures:   Antimicrobials:    Objective: Vitals:   06/19/22 0428 06/19/22 0736 06/19/22 0827 06/19/22 0905  BP: 131/65 134/75 118/63   Pulse: 78 82 73 71  Resp: 18 20 16 18   Temp: 98.1 F (36.7 C) 98.1 F (36.7 C)    TempSrc: Oral Oral    SpO2: 91%  93% 95%  Weight:      Height:        Intake/Output Summary (Last 24 hours) at 06/19/2022 1109 Last data filed at 06/19/2022 1020 Gross per 24 hour  Intake 420 ml  Output 4800 ml  Net -4380 ml   Filed Weights   06/17/22 0658 06/18/22 0503 06/19/22 0110  Weight: (!) 140.7 kg (!) 139.6 kg 133.8 kg    Examination:  General exam: Obese chronically ill male sitting up in bed, AAOx3, no distress HEENT: Neck obese unable to assess JVD CVS: S1-S2, regular rhythm Lungs: Few basilar Rales, decreased breath sounds at the bases Abdomen: Soft, nontender, bowel sounds present Extremities: 1+ edema  Skin: No rashes Psychiatry:  Mood & affect appropriate.   Data Reviewed:   CBC: Recent Labs  Lab 06/16/22 1505 06/18/22 0042  06/19/22 0103  WBC 10.8* 7.4 8.8  NEUTROABS 9.1*  --   --   HGB 15.8 13.8 14.3  HCT 48.2 43.9 44.4  MCV 93.2 96.1 94.1  PLT 265 210 213   Basic Metabolic Panel: Recent Labs  Lab 06/16/22 1505 06/17/22 1046 06/18/22 0042 06/19/22 0103  NA 136  --  136 134*  K 4.3  --  3.9  3.9  CL 93*  --  90* 89*  CO2 33*  --  36* 35*  GLUCOSE 93  --  113* 135*  BUN 24*  --  24* 23*  CREATININE 1.18  --  1.18 1.13  CALCIUM 8.4*  --  8.7* 8.7*  MG  --  2.2  --   --    GFR: Estimated Creatinine Clearance: 90.3 mL/min (by C-G formula based on SCr of 1.13 mg/dL). Liver Function Tests: Recent Labs  Lab 06/16/22 1505  AST 32  ALT 23  ALKPHOS 56  BILITOT 0.9  PROT 7.5  ALBUMIN 3.7   No results for input(s): "LIPASE", "AMYLASE" in the last 168 hours. No results for input(s): "AMMONIA" in the last 168 hours. Coagulation Profile: No results for input(s): "INR", "PROTIME" in the last 168 hours. Cardiac Enzymes: No results for input(s): "CKTOTAL", "CKMB", "CKMBINDEX", "TROPONINI" in the last 168 hours. BNP (last 3 results) Recent Labs    08/26/21 1137 02/10/22 1146  PROBNP 54.0 504*   HbA1C: No results for input(s): "HGBA1C" in the last 72 hours. CBG: No results for input(s): "GLUCAP" in the last 168 hours. Lipid Profile: No results for input(s): "CHOL", "HDL", "LDLCALC", "TRIG", "CHOLHDL", "LDLDIRECT" in the last 72 hours. Thyroid Function Tests: Recent Labs    06/17/22 1046 06/17/22 1510  TSH 6.189*  --   FREET4  --  0.79   Anemia Panel: No results for input(s): "VITAMINB12", "FOLATE", "FERRITIN", "TIBC", "IRON", "RETICCTPCT" in the last 72 hours. Urine analysis:    Component Value Date/Time   COLORURINE YELLOW 06/25/2021 1618   APPEARANCEUR CLEAR 06/25/2021 1618   LABSPEC 1.025 06/25/2021 1618   PHURINE 6.5 06/25/2021 1618   GLUCOSEU 100 (A) 06/25/2021 1618   HGBUR NEGATIVE 06/25/2021 1618   BILIRUBINUR NEGATIVE 06/25/2021 1618   KETONESUR NEGATIVE 06/25/2021 1618   PROTEINUR NEGATIVE 06/25/2021 1618   NITRITE NEGATIVE 06/25/2021 1618   LEUKOCYTESUR NEGATIVE 06/25/2021 1618   Sepsis Labs: @LABRCNTIP (procalcitonin:4,lacticidven:4)  ) Recent Results (from the past 240 hour(s))  SARS Coronavirus 2 by RT PCR (hospital order, performed in [facility] hospital lab) *cepheid single result test* Anterior Nasal Swab     Status: None   Collection Time: 06/16/22  3:05 PM   Specimen: Anterior Nasal Swab  Result Value Ref Range Status   SARS Coronavirus 2 by RT PCR NEGATIVE NEGATIVE Final    Comment: (NOTE) SARS-CoV-2 target nucleic acids are NOT DETECTED.  The SARS-CoV-2 RNA is generally detectable in upper and lower respiratory specimens during the acute phase of infection. The lowest concentration of SARS-CoV-2 viral copies this assay can detect is 250 copies / mL. A negative result does not preclude SARS-CoV-2 infection and should not be used as the sole basis for treatment or other patient management decisions.  A negative result may occur with improper specimen collection / handling, submission of specimen other than nasopharyngeal swab, presence of viral mutation(s) within the areas targeted by this assay, and inadequate number of viral copies (<250 copies / mL). A negative result must be combined with clinical observations, patient history, and epidemiological information.  Fact Sheet for Patients:   RoadLapTop.co.za  Fact Sheet for Healthcare Providers: http://kim-miller.com/  This test is not yet approved or  cleared by the [place] FDA and has been authorized for detection and/or diagnosis of SARS-CoV-2 by FDA under an Emergency Use Authorization (EUA).  This EUA will remain in effect (meaning this test can be used) for the duration of the COVID-19 declaration under Section 564(b)(1) of the Act, 21 U.S.C. section 360bbb-3(b)(1), unless the authorization is terminated or revoked sooner.  Performed at [facility], [address] [place], [place] [address]      Radiology Studies: ECHOCARDIOGRAM COMPLETE  Result Date: 06/18/2022    ECHOCARDIOGRAM REPORT   Patient Name:   KARDELL GONZALEZLOPEZ Date of Exam: 06/18/2022 Medical Rec #:  [id number]         Height:        66.0 in Accession #:    [id number]        Weight:       307.8 lb Date of Birth:  03/17/1960          BSA:          2.402 m Patient Age:    62 years          BP:           125/65 mmHg Patient Gender: M                 HR:           98 bpm. Exam Location:  Inpatient Procedure: 2D Echo, Cardiac Doppler, Color Doppler and Intracardiac            Opacification Agent Indications:    CHF-Acute Diastolic I50.31  History:        Patient has prior history of Echocardiogram examinations, most                 recent 03/08/2022. CHF, CKD 2, Arrythmias:Atrial Fibrillation;                 Risk Factors:Sleep Apnea, Hypertension and Former Smoker.  Sonographer:    Dondra Prader RVT RCS Referring Phys: [id number] ALLISON WOLFE IMPRESSIONS  1. Left ventricular ejection fraction, by estimation, is 60 to 65%. The left ventricle has normal function. The left ventricle has no regional wall motion abnormalities. There is mild concentric left ventricular hypertrophy. Left ventricular diastolic parameters were normal.  2. Right ventricular systolic function is low normal. The right ventricular size is mildly enlarged. There is normal pulmonary artery systolic pressure.  3. Right atrial size was mildly dilated.  4. The mitral valve is normal in structure. No evidence of mitral valve regurgitation.  5. The aortic valve is tricuspid. Aortic valve regurgitation is not visualized.  6. The inferior vena cava is normal in size with greater than 50% respiratory variability, suggesting right atrial pressure of 3 mmHg. Comparison(s): No significant change from prior study. FINDINGS  Left Ventricle: Left ventricular ejection fraction, by estimation, is 60 to 65%. The left ventricle has normal function. The left ventricle has no regional wall motion abnormalities. Definity contrast agent was given IV to delineate the left ventricular  endocardial borders. The left ventricular internal cavity size was normal in size. There is mild concentric left  ventricular hypertrophy. Left ventricular diastolic parameters were normal. Right Ventricle: The right ventricular size is mildly enlarged. Right ventricular systolic function is low normal. There is normal pulmonary artery systolic pressure. The tricuspid regurgitant velocity is 1.73 m/s,  and with an assumed right atrial pressure of 3 mmHg, the estimated right ventricular systolic pressure is 15.0 mmHg. Left Atrium: Left atrial size was normal in size. Right Atrium: Right atrial size was mildly dilated. Pericardium: Trivial pericardial effusion is present. Mitral Valve: The mitral valve is normal in structure. No evidence of mitral valve regurgitation. Tricuspid Valve: Tricuspid valve regurgitation is trivial. Aortic Valve: The aortic valve is tricuspid. Aortic valve regurgitation is not visualized. Aortic valve mean gradient measures 2.0 mmHg. Aortic valve peak gradient measures 3.0 mmHg. Aortic valve area, by VTI measures 3.73 cm. Pulmonic Valve: Pulmonic valve regurgitation is not visualized. Aorta: The aortic root and ascending aorta are structurally normal, with no evidence of dilitation. Venous: The inferior vena cava is normal in size with greater than 50% respiratory variability, suggesting right atrial pressure of 3 mmHg. IAS/Shunts: No atrial level shunt detected by color flow Doppler.  LEFT VENTRICLE PLAX 2D LVIDd:         4.60 cm   Diastology LVIDs:         2.80 cm   LV e' medial:    5.11 cm/s LV PW:         1.30 cm   LV E/e' medial:  15.8 LV IVS:        1.40 cm   LV e' lateral:   12.60 cm/s LVOT diam:     2.10 cm   LV E/e' lateral: 6.4 LV SV:         67 LV SV Index:   28 LVOT Area:     3.46 cm  RIGHT VENTRICLE             IVC RV Basal diam:  4.90 cm     IVC diam: 2.00 cm RV Mid diam:    4.70 cm RV S prime:     10.30 cm/s TAPSE (M-mode): 1.8 cm LEFT ATRIUM             Index        RIGHT ATRIUM           Index LA diam:        4.50 cm 1.87 cm/m   RA Area:     32.50 cm LA Vol (A2C):   53.0 ml 22.07  ml/m  RA Volume:   124.00 ml 51.63 ml/m LA Vol (A4C):   49.9 ml 20.78 ml/m LA Biplane Vol: 62.6 ml 26.06 ml/m  AORTIC VALVE                    PULMONIC VALVE AV Area (Vmax):    3.52 cm     PV Vmax:       0.72 m/s AV Area (Vmean):   3.43 cm     PV Peak grad:  2.1 mmHg AV Area (VTI):     3.73 cm AV Vmax:           87.30 cm/s AV Vmean:          57.600 cm/s AV VTI:            0.180 m AV Peak Grad:      3.0 mmHg AV Mean Grad:      2.0 mmHg LVOT Vmax:         88.60 cm/s LVOT Vmean:        57.100 cm/s LVOT VTI:          0.194 m LVOT/AV VTI ratio: 1.08  AORTA Ao Root diam: 3.50 cm Ao Asc diam:  3.30 cm Ao Arch diam: 2.8 cm MITRAL VALVE               TRICUSPID VALVE MV Area (PHT): 3.31 cm    TR Peak grad:   12.0 mmHg MV Decel Time: 229 msec    TR Vmax:        173.00 cm/s MV E velocity: 80.70 cm/s MV A velocity: 69.20 cm/s  SHUNTS MV E/A ratio:  1.17        Systemic VTI:  0.19 m                            Systemic Diam: 2.10 cm Carolan Clines Electronically signed by Carolan Clines Signature Date/Time: 06/18/2022/12:14:54 PM    Final      Scheduled Meds:  apixaban  5 mg Oral BID   cefdinir  300 mg Oral Q12H   dofetilide  500 mcg Oral BID   empagliflozin  10 mg Oral Daily   furosemide  60 mg Intravenous BID   metoprolol succinate  25 mg Oral BID WC   mometasone-formoterol  2 puff Inhalation BID   And   umeclidinium bromide  1 puff Inhalation Daily   montelukast  10 mg Oral QHS   potassium chloride  40 mEq Oral Once   Continuous Infusions:     LOS: 2 days    Time spent:    Zannie Cove, MD [facility]   06/19/2022, 11:09 AM

## 2022-06-20 ENCOUNTER — Telehealth (HOSPITAL_COMMUNITY): Payer: Self-pay | Admitting: Pharmacy Technician

## 2022-06-20 ENCOUNTER — Other Ambulatory Visit (HOSPITAL_COMMUNITY): Payer: Self-pay

## 2022-06-20 ENCOUNTER — Encounter (HOSPITAL_COMMUNITY): Payer: Self-pay | Admitting: Internal Medicine

## 2022-06-20 DIAGNOSIS — J9621 Acute and chronic respiratory failure with hypoxia: Secondary | ICD-10-CM | POA: Diagnosis not present

## 2022-06-20 LAB — MAGNESIUM: Magnesium: 2.1 mg/dL (ref 1.7–2.4)

## 2022-06-20 LAB — BASIC METABOLIC PANEL
Anion gap: 10 (ref 5–15)
BUN: 22 mg/dL — ABNORMAL HIGH (ref 6–20)
CO2: 35 mmol/L — ABNORMAL HIGH (ref 22–32)
Calcium: 8.8 mg/dL — ABNORMAL LOW (ref 8.9–10.3)
Chloride: 88 mmol/L — ABNORMAL LOW (ref 98–111)
Creatinine, Ser: 1.32 mg/dL — ABNORMAL HIGH (ref 0.61–1.24)
GFR, Estimated: >60 mL/min (ref >60)
Glucose, Bld: 128 mg/dL — ABNORMAL HIGH (ref 70–99)
Potassium: 3.8 mmol/L (ref 3.5–5.1)
Sodium: 133 mmol/L — ABNORMAL LOW (ref 135–145)

## 2022-06-20 MED ORDER — POTASSIUM CHLORIDE CRYS ER 20 MEQ PO TBCR
40.0000 meq | EXTENDED_RELEASE_TABLET | Freq: Two times a day (BID) | ORAL | Status: AC
  Administered 2022-06-20 (×2): 40 meq via ORAL
  Filled 2022-06-20 (×2): qty 2

## 2022-06-20 MED ORDER — FUROSEMIDE 10 MG/ML IJ SOLN
40.0000 mg | Freq: Two times a day (BID) | INTRAMUSCULAR | Status: DC
  Administered 2022-06-20 – 2022-06-22 (×6): 40 mg via INTRAVENOUS
  Filled 2022-06-20 (×6): qty 4

## 2022-06-20 MED ORDER — ORAL CARE MOUTH RINSE: 15.0000 mL | OROMUCOSAL | Status: DC | PRN

## 2022-06-20 MED ORDER — TAMSULOSIN HCL 0.4 MG PO CAPS
0.4000 mg | ORAL_CAPSULE | Freq: Every day | ORAL | Status: DC
  Administered 2022-06-20 – 2022-06-22 (×3): 0.4 mg via ORAL
  Filled 2022-06-20 (×3): qty 1

## 2022-06-20 NOTE — Telephone Encounter (Signed)
Patient Advocate Encounter  Prior Authorization for Jardiance 10MG  tablets  has been approved.    PA# [id number] Insurance [facility] Medicaid of [facility] Electronic Prior Authorization Request Form  Effective dates: 06/20/2022 through 06/20/2023  Patients co-pay is $4.00.     Roland Earl, CPhT Pharmacy Patient Advocate Specialist [facility] Patient Advocate Team Direct Number: [phone]  Fax: [phone]

## 2022-06-20 NOTE — Progress Notes (Signed)
Heart Failure Nurse Navigator Progress Note  PCP: Kaleen Mask, MD PCP-Cardiologist: None Admission Diagnosis: Community acquired pneumonia Admitted from: Home to [facility] to [facility]  Presentation:   [name] [name] presented with back pain, weakness, shortness of breath, trouble doing basic activities, swelling to both legs, fingers, and some in his abdomen. On arrival O2 sats were in the 80's and patient was in respiratory distress. BMI 44.30, BNP 155. CTA chest with chronic residual fibrinous nonocclusive thrombus. CXR with pneumonia, IV antibiotics started.   Patient was educated on heart failure, daily weights, when to call his doctor or go to the ED. Diet/ fluid restrictions, reported to eating some salt and drinking some soda as well as going to the Pub with friends 3-4 times per week, where he will drink average 3-4 beers each visit. Education on taking all medications as prescribed, patient reported to using a medication key chain and a alarm for taking his medications on time. Also educated on attending all medical appointments. Patient wanted to have more education on what diets and foods to help him with weight loss and heart care. Spoke with MD and a Dietician order for Heart failure was placed. Patient verbalized his understanding of education, a HF TOC appointment was scheduled for 07/11/2022 @ 2 pm.   ECHO/ LVEF: 60-65 HFpEF   Clinical Course:  Past Medical History:  Diagnosis Date   Chronic diastolic CHF (congestive heart failure) (HCC)    COPD (chronic obstructive pulmonary disease) (HCC)    Hypertension    Obesity (BMI 30-39.9) 12/15/2016   OSA (obstructive sleep apnea)    Persistent atrial fibrillation (HCC) 12/15/2016   Visit for monitoring Tikosyn therapy      Social History   Socioeconomic History   Marital status: Divorced    Spouse name: Not on file   Number of children: Not on file   Years of education: Not on file   Highest education level: Not on file   Occupational History   Not on file  Tobacco Use   Smoking status: Former    Packs/day: 1.00    Years: 30.00    Additional pack years: 0.00    Total pack years: 30.00    Types: Cigarettes    Quit date: 2009    Years since quitting: 15.3    Passive exposure: Past   Smokeless tobacco: Never  Vaping Use   Vaping Use: Never used  Substance and Sexual Activity   Alcohol use: Yes    Alcohol/week: 4.0 standard drinks of alcohol    Types: 4 Cans of beer per week   Drug use: No   Sexual activity: Not on file  Other Topics Concern   Not on file  Social History Narrative   Not on file   Social Determinants of Health   Financial Resource Strain: Not on file  Food Insecurity: No Food Insecurity (06/17/2022)   Hunger Vital Sign    Worried About Running Out of Food in the Last Year: Never true    Ran Out of Food in the Last Year: Never true  Transportation Needs: No Transportation Needs (06/17/2022)   PRAPARE - Administrator, Civil Service (Medical): No    Lack of Transportation (Non-Medical): No  Physical Activity: Not on file  Stress: Not on file  Social Connections: Not on file   Education Assessment and Provision:  Detailed education and instructions provided on heart failure disease management including the following:  Signs and symptoms of Heart Failure  When to call the physician Importance of daily weights Low sodium diet Fluid restriction Medication management Anticipated future follow-up appointments  Patient education given on each of the above topics.  Patient acknowledges understanding via teach back method and acceptance of all instructions.  Education Materials:  "Living Better With Heart Failure" Booklet, HF zone tool, & Daily Weight Tracker Tool.  Patient has scale at home: yes Patient has pill box at home: NA, uses a reminder alarm for medication.    High Risk Criteria for Readmission and/or Poor Patient Outcomes: Heart failure hospital  admissions (last 6 months): 0  No Show rate: 20% Difficult social situation: No Demonstrates medication adherence: Yes Primary Language: English Literacy level: Reading, writing, and comprehension  Barriers of Care:   Diet/ fluid restrictions Daily weights ETOH cessation HF education  Considerations/Referrals:   Referral made to Heart Failure Pharmacist Stewardship: yes Referral made to Heart Failure CSW/NCM TOC: No Referral made to Heart & Vascular TOC clinic: Yes, 07/11/2022 @ 2 pm  Items for Follow-up on DC/TOC: Diet/ fluid restrictions ( salt/ soda) Daily weights ETOH cessation ( drinks 3-4 x per week, of 4 beers)  Continued HF education   Rhae Hammock, BSN, RN Heart Failure Print production planner Chat Only

## 2022-06-20 NOTE — TOC Benefit Eligibility Note (Signed)
Patient Product/process development scientist completed.    The patient is currently admitted and upon discharge could be taking Jardiance 10 mg.  Requires Prior Authorization  The patient is insured through [employer] Medicaid   This test claim was processed through [facility]- copay amounts may vary at other pharmacies due to pharmacy/plan contracts, or as the patient moves through the different stages of their insurance plan.  [name] [name], CPHT Pharmacy Patient Advocate Specialist [facility] Patient Advocate Team Direct Number: [phone]  Fax: [phone]

## 2022-06-20 NOTE — Progress Notes (Signed)
Mobility Specialist Progress Note:   06/20/22 0900  Mobility  Activity Ambulated with assistance in hallway  Level of Assistance Standby assist, set-up cues, supervision of patient - no hands on  Assistive Device None  Distance Ambulated (ft) 500 ft  Activity Response Tolerated well  Mobility Referral Yes  $Mobility charge 1 Mobility  Mobility Specialist Start Time (ACUTE ONLY) 0900  Mobility Specialist Stop Time (ACUTE ONLY) 0910  Mobility Specialist Time Calculation (min) (ACUTE ONLY) 10 min   Pt eager for mobility session. Required no physical assistance throughout. SpO2 86% on 6LO2 ~243ft. Dropped to 80%, required 8Lo2 to recover. Pt back on 5LO2 (pt received on 5L), sitting EOB with all needs met.   Addison Lank Mobility Specialist Please contact via SecureChat or  Rehab office at [phone]

## 2022-06-20 NOTE — Telephone Encounter (Signed)
Patient Advocate Encounter   Received notification that prior authorization for Jardiance 10MG  tablets is required.   PA submitted on 06/20/2022 Key [id number] Insurance [facility] Medicaid of [facility] Electronic Prior Authorization Request Form Status is pending       Roland Earl, CPhT Pharmacy Patient Advocate Specialist [facility] Patient Advocate Team Direct Number: [phone]  Fax: [phone]

## 2022-06-20 NOTE — Progress Notes (Signed)
PROGRESS NOTE    [name] [name]  NUU:[id number] DOB: [birth year] DOA: 06/16/2022 PCP: Kaleen Mask, MD  62/M with history of COPD, chronic respiratory failure on 2 L home O2, paroxysmal A-fib on Eliquis, sleep apnea, obesity, CKD 2, presented to the ED with progressive shortness of breath and edema X 2 to 3 weeks, prescribed Lasix by his PCP few weeks ago. -In the ED hypoxic, placed on 6 L high flow nasal cannula, labs with BNP 155, WBC 10.8, D-dimer 0.7, CTA chest with chronic residual fibrinous nonocclusive thrombus, new central groundglass opacities in anterior upper lobe, inferior upper lobe concerning for multifocal infectious/inflammatory process  -Improving on diuretics slowly  Subjective: -Feels better, breathing is improving, abdomen less distended  Assessment and Plan:  Acute on chronic respiratory failure with hypoxia  -Suspect primarily secondary to CHF, grossly volume overloaded, low clinical suspicion for pneumonia, in the background has COPD as well -Diuretics as below, continue to wean O2 down to 2 L  Acute diastolic CHF -Bilateral groundglass opacities noted on CT chest, clinically more consistent with CHF -Echo with EF of 60%, mildly reduced RV -Diuresing well, 5.5 L negative, weight down 11 LB  -Continue IV Lasix, Jardiance and Aldactone  -Needs to lose weight, discussed importance of dietary changes as well  Untreated sleep apnea Morbid obesity -Reportedly getting a new CPAP machine, intolerant to previous -Needs weight loss and lifestyle modification -Resume CPAP, despite orders this has not happened, will reorder  ?  Pneumonia -Low clinical suspicion, no fever or leukocytosis, PCT <0.1 -Discontinued antibiotics  Prolonged QT interval -Monitor electrolytes, avoid QT prolonging meds  Chronic back pain -Started on lidocaine patch and gabapentin  COPD (chronic obstructive pulmonary disease) (HCC) No signs of exacerbation at this time PFT in 2023:  severe obstruction, positive bronchodilator response, air trapping, and moderate diffusion defect.  -continue Bretzi BID, singulair and SABA prn   Generalized anxiety disorder Start hydroxyzine PRN Outpatient management with PCP   Persistent atrial fibrillation (HCC) CHA2DS2Vasc is 3, on Eliquis -hx of afib with high burden of 54% on monitor, in NSR today. -NSR, continue eliquis, tikosyn and toprol XL   PE (pulmonary thromboembolism) (HCC) No acute PE on CTA, chronic residual fibrinous thrombus noted Continue eliquis BID   CKD (chronic kidney disease), stage II Stable, continue to monitor   DVT prophylaxis: Eliquis Code Status: Full code Family Communication: None present Disposition Plan: Home likely 2 to 3 days  Consultants:    Procedures:   Antimicrobials:    Objective: Vitals:   06/20/22 0455 06/20/22 0712 06/20/22 0843 06/20/22 1122  BP:  118/64  122/68  Pulse:  73  67  Resp:  18    Temp:  97.6 F (36.4 C)  97.6 F (36.4 C)  TempSrc:  Oral  Oral  SpO2:  90% 91% 90%  Weight: 136 kg     Height:        Intake/Output Summary (Last 24 hours) at 06/20/2022 1207 Last data filed at 06/20/2022 1127 Gross per 24 hour  Intake 1560 ml  Output 3125 ml  Net -1565 ml   Filed Weights   06/18/22 0503 06/19/22 0110 06/20/22 0455  Weight: (!) 139.6 kg 133.8 kg 136 kg    Examination:  General exam: Obese chronically ill male sitting up in bed, AAOx3, no distress HEENT: Neck obese unable to assess JVD CVS: S1-S2, regular rhythm Lungs: Few basilar Rales, decreased breath sounds at the bases Abdomen: Soft, nontender, bowel sounds present Extremities: 1+ edema  Skin: No rashes Psychiatry:  Mood & affect appropriate.   Data Reviewed:   CBC: Recent Labs  Lab 06/16/22 1505 06/18/22 0042 06/19/22 0103  WBC 10.8* 7.4 8.8  NEUTROABS 9.1*  --   --   HGB 15.8 13.8 14.3  HCT 48.2 43.9 44.4  MCV 93.2 96.1 94.1  PLT 265 210 213   Basic Metabolic Panel: Recent  Labs  Lab 06/16/22 1505 06/17/22 1046 06/18/22 0042 06/19/22 0103 06/20/22 0101  NA 136  --  136 134* 133*  K 4.3  --  3.9 3.9 3.8  CL 93*  --  90* 89* 88*  CO2 33*  --  36* 35* 35*  GLUCOSE 93  --  113* 135* 128*  BUN 24*  --  24* 23* 22*  CREATININE 1.18  --  1.18 1.13 1.32*  CALCIUM 8.4*  --  8.7* 8.7* 8.8*  MG  --  2.2  --   --  2.1   GFR: Estimated Creatinine Clearance: 78 mL/min (A) (by C-G formula based on SCr of 1.32 mg/dL (H)). Liver Function Tests: Recent Labs  Lab 06/16/22 1505  AST 32  ALT 23  ALKPHOS 56  BILITOT 0.9  PROT 7.5  ALBUMIN 3.7   No results for input(s): "LIPASE", "AMYLASE" in the last 168 hours. No results for input(s): "AMMONIA" in the last 168 hours. Coagulation Profile: No results for input(s): "INR", "PROTIME" in the last 168 hours. Cardiac Enzymes: No results for input(s): "CKTOTAL", "CKMB", "CKMBINDEX", "TROPONINI" in the last 168 hours. BNP (last 3 results) Recent Labs    08/26/21 1137 02/10/22 1146  PROBNP 54.0 504*   HbA1C: No results for input(s): "HGBA1C" in the last 72 hours. CBG: No results for input(s): "GLUCAP" in the last 168 hours. Lipid Profile: No results for input(s): "CHOL", "HDL", "LDLCALC", "TRIG", "CHOLHDL", "LDLDIRECT" in the last 72 hours. Thyroid Function Tests: Recent Labs    06/17/22 1510  FREET4 0.79   Anemia Panel: No results for input(s): "VITAMINB12", "FOLATE", "FERRITIN", "TIBC", "IRON", "RETICCTPCT" in the last 72 hours. Urine analysis:    Component Value Date/Time   COLORURINE YELLOW 06/25/2021 1618   APPEARANCEUR CLEAR 06/25/2021 1618   LABSPEC 1.025 06/25/2021 1618   PHURINE 6.5 06/25/2021 1618   GLUCOSEU 100 (A) 06/25/2021 1618   HGBUR NEGATIVE 06/25/2021 1618   BILIRUBINUR NEGATIVE 06/25/2021 1618   KETONESUR NEGATIVE 06/25/2021 1618   PROTEINUR NEGATIVE 06/25/2021 1618   NITRITE NEGATIVE 06/25/2021 1618   LEUKOCYTESUR NEGATIVE 06/25/2021 1618   Sepsis  Labs: @LABRCNTIP (procalcitonin:4,lacticidven:4)  ) Recent Results (from the past 240 hour(s))  SARS Coronavirus 2 by RT PCR (hospital order, performed in [facility] hospital lab) *cepheid single result test* Anterior Nasal Swab     Status: None   Collection Time: 06/16/22  3:05 PM   Specimen: Anterior Nasal Swab  Result Value Ref Range Status   SARS Coronavirus 2 by RT PCR NEGATIVE NEGATIVE Final    Comment: (NOTE) SARS-CoV-2 target nucleic acids are NOT DETECTED.  The SARS-CoV-2 RNA is generally detectable in upper and lower respiratory specimens during the acute phase of infection. The lowest concentration of SARS-CoV-2 viral copies this assay can detect is 250 copies / mL. A negative result does not preclude SARS-CoV-2 infection and should not be used as the sole basis for treatment or other patient management decisions.  A negative result may occur with improper specimen collection / handling, submission of specimen other than nasopharyngeal swab, presence of viral mutation(s) within the areas targeted by  this assay, and inadequate number of viral copies (<250 copies / mL). A negative result must be combined with clinical observations, patient history, and epidemiological information.  Fact Sheet for Patients:   RoadLapTop.co.za  Fact Sheet for Healthcare Providers: http://kim-miller.com/  This test is not yet approved or  cleared by the [place] FDA and has been authorized for detection and/or diagnosis of SARS-CoV-2 by FDA under an Emergency Use Authorization (EUA).  This EUA will remain in effect (meaning this test can be used) for the duration of the COVID-19 declaration under Section 564(b)(1) of the Act, 21 U.S.C. section 360bbb-3(b)(1), unless the authorization is terminated or revoked sooner.  Performed at [facility], [address] [place], [place] [address]      Radiology Studies: No results  found.   Scheduled Meds:  apixaban  5 mg Oral BID   dofetilide  500 mcg Oral BID   empagliflozin  10 mg Oral Daily   furosemide  40 mg Intravenous BID   metoprolol succinate  25 mg Oral BID WC   mometasone-formoterol  2 puff Inhalation BID   And   umeclidinium bromide  1 puff Inhalation Daily   montelukast  10 mg Oral QHS   potassium chloride  40 mEq Oral BID   spironolactone  12.5 mg Oral Daily   tamsulosin  0.4 mg Oral QPC supper   Continuous Infusions:     LOS: 3 days    Time spent:    Zannie Cove, MD [facility]   06/20/2022, 12:07 PM

## 2022-06-20 NOTE — Progress Notes (Signed)
   Heart Failure Stewardship Pharmacist Progress Note   PCP: Kaleen Mask, MD PCP-Cardiologist: Regan Lemming, MD   HPI:  62 YO male with a PMH of pulmonary embolus, COPD, atrial fibrillation on DOAC, OSA, GERD, CKD stage 2, HTN, diastolic CHF, chronic respiratory failure on 2L oxygen [place], obesity   Patient presented to the [facility] ED on 5/10 with shortness of breath, chronic back pain, and nonproductive cough (reports recent exposure to pneumonia). Patient also states that his legs started to swell about 2 weeks ago and he was given Lasix by his MD--he reports this has not made a difference in swelling. He describes gaining weight but is eating poorly. Most recent echo from 02/2022 showing EF 60-65%, G1DD, mildly reduced RV, RAP 3 mmHg. Repeat echo done 5/12 showing EF 60-65%, mildly reduced RV, RAP 3 mmHg--no significant changes from prior echo.   Current HF Medications:  Diuretic: furosemide 60 mg IV BID Beta Blocker: metoprolol succinate 25 mg BID MRA: spironolactone 12.5 mg daily SGLT2i: Jardiance 10 mg daily  Prior to admission HF Medications: Diuretic: furosemide 80 mg daily Beta blocker: metoprolol succinate 50 mg BID  Pertinent Lab Values: Serum creatinine 1.32, BUN 22, Potassium 3.8, Sodium 133, BNP 155, Magnesium 2.1   Vital Signs: Weight: 299 lbs (admission weight: 300 lbs) Blood pressure: 120s/60s  Heart rate: 60s-70s  I/O: -2.5L yesterday; net -5.6L  Medication Assistance / Insurance Benefits Check: Does the patient have prescription insurance?  Yes Type of insurance plan: Medicaid  Outpatient Pharmacy:  Prior to admission outpatient pharmacy: [facility] Is the patient willing to use [facility] TOC pharmacy at discharge? Yes Is the patient willing to transition their outpatient pharmacy to utilize a [facility] outpatient pharmacy?   Pending   Assessment: 1. Acute on chronic diastolic CHF (LVEF 60-65%. NYHA class III symptoms. - SCr increased 1.13>>1.32  from yesterday on furosemide 60 mg IV BID, spironolactone, and Jardiance. Strict I's and O's. Keep K >4 and Mg >2.  - Agree with decreasing furosemide to 40 mg IV BID today and recommend holding tomorrow if Scr continues to increase.  - Continue metoprolol succinate 25 mg BID and monitor HR closely  - Continue spironolactone 12.5 mg daily and monitor SCr - Continue Jardiance 10 mg daily and monitor SCr    Plan: 1) Medication changes recommended at this time: - Decrease furosemide to 40 mg IV BID and follow-up Scr tomorrow for further dose adjustment  2) Patient assistance: - Jardiance PA approved - copay $4  3)  Education  - Patient has been educated on current HF medications and potential additions to HF medication regimen - Patient verbalizes understanding that over the next few months, these medication doses may change and more medications may be added to optimize HF regimen - Patient has been educated on basic disease state pathophysiology and goals of therapy   Cherylin Mylar, PharmD PGY1 Pharmacy Resident 5/14/202410:57 AM

## 2022-06-20 NOTE — Telephone Encounter (Signed)
Pharmacy Patient Advocate Encounter  Insurance verification completed.    The patient is insured through [employer]   The patient is currently admitted and ran test claims for the following: Jardiance.  Copays and coinsurance results were relayed to Inpatient clinical team. 

## 2022-06-20 NOTE — Plan of Care (Signed)
  Problem: Education: Goal: Knowledge of General Education information will improve Description: Including pain rating scale, medication(s)/side effects and non-pharmacologic comfort measures Outcome: Progressing   Problem: Health Behavior/Discharge Planning: Goal: Ability to manage health-related needs will improve Outcome: Progressing   Problem: Clinical Measurements: Goal: Ability to maintain clinical measurements within normal limits will improve Outcome: Progressing Goal: Will remain free from infection Outcome: Progressing Goal: Diagnostic test results will improve Outcome: Progressing Goal: Respiratory complications will improve Outcome: Progressing Goal: Cardiovascular complication will be avoided Outcome: Progressing   Problem: Activity: Goal: Risk for activity intolerance will decrease Outcome: Progressing   Problem: Elimination: Goal: Will not experience complications related to bowel motility Outcome: Progressing Goal: Will not experience complications related to urinary retention Outcome: Progressing   Problem: Pain Managment: Goal: General experience of comfort will improve Outcome: Progressing   Problem: Safety: Goal: Ability to remain free from injury will improve Outcome: Progressing   Problem: Skin Integrity: Goal: Risk for impaired skin integrity will decrease Outcome: Progressing   Problem: Education: Goal: Ability to demonstrate management of disease process will improve Outcome: Progressing Goal: Ability to verbalize understanding of medication therapies will improve Outcome: Progressing Goal: Individualized Educational Video(s) Outcome: Progressing   Problem: Activity: Goal: Capacity to carry out activities will improve Outcome: Progressing   Problem: Cardiac: Goal: Ability to achieve and maintain adequate cardiopulmonary perfusion will improve Outcome: Progressing   Problem: Activity: Goal: Ability to tolerate increased activity will  improve Outcome: Progressing   Problem: Clinical Measurements: Goal: Ability to maintain a body temperature in the normal range will improve Outcome: Progressing   Problem: Respiratory: Goal: Ability to maintain adequate ventilation will improve Outcome: Progressing Goal: Ability to maintain a clear airway will improve Outcome: Progressing

## 2022-06-21 DIAGNOSIS — I4819 Other persistent atrial fibrillation: Secondary | ICD-10-CM

## 2022-06-21 DIAGNOSIS — I2699 Other pulmonary embolism without acute cor pulmonale: Secondary | ICD-10-CM | POA: Diagnosis not present

## 2022-06-21 DIAGNOSIS — F411 Generalized anxiety disorder: Secondary | ICD-10-CM | POA: Diagnosis not present

## 2022-06-21 DIAGNOSIS — N182 Chronic kidney disease, stage 2 (mild): Secondary | ICD-10-CM

## 2022-06-21 DIAGNOSIS — I5033 Acute on chronic diastolic (congestive) heart failure: Secondary | ICD-10-CM | POA: Diagnosis not present

## 2022-06-21 LAB — BASIC METABOLIC PANEL
Anion gap: 9 (ref 5–15)
BUN: 26 mg/dL — ABNORMAL HIGH (ref 6–20)
CO2: 33 mmol/L — ABNORMAL HIGH (ref 22–32)
Calcium: 8.9 mg/dL (ref 8.9–10.3)
Chloride: 91 mmol/L — ABNORMAL LOW (ref 98–111)
Creatinine, Ser: 1.21 mg/dL (ref 0.61–1.24)
GFR, Estimated: >60 mL/min (ref >60)
Glucose, Bld: 120 mg/dL — ABNORMAL HIGH (ref 70–99)
Potassium: 4 mmol/L (ref 3.5–5.1)
Sodium: 133 mmol/L — ABNORMAL LOW (ref 135–145)

## 2022-06-21 LAB — MAGNESIUM: Magnesium: 2.4 mg/dL (ref 1.7–2.4)

## 2022-06-21 NOTE — Progress Notes (Signed)
Physical Therapy Treatment Patient Details Name: [name] [name] MRN: [id number] DOB: [birth year] Today's Date: 06/21/2022   History of Present Illness The pt is a 62 yo male presenting 5/10 with SOB. Found to have COPD exacerbation with hypoxia. PMH includes: CHF, COPD, HTN, obesity, OSA, and afib.    PT Comments    Pt tolerated treatment well today. Pt with similar presentation to previous session. Pt received on 5L at 92%. Pt dropped to 80% on 6L during ambulation. Pt titrated to 8L and able to recover to 95% with pursed lip breathing. Pt left on 5L at 94%. No change in DC/DME recs at this time. PT will continue to follow   Recommendations for follow up therapy are one component of a multi-disciplinary discharge planning process, led by the attending physician.  Recommendations may be updated based on patient status, additional functional criteria and insurance authorization.  Follow Up Recommendations       Assistance Recommended at Discharge Frequent or constant Supervision/Assistance  Patient can return home with the following Help with stairs or ramp for entrance   Equipment Recommendations  None recommended by PT    Recommendations for Other Services       Precautions / Restrictions Precautions Precautions: Fall Precaution Comments: watch O2, on 8L for eval Restrictions Weight Bearing Restrictions: No     Mobility  Bed Mobility Overal bed mobility: Independent                  Transfers Overall transfer level: Independent Equipment used: None Transfers: Sit to/from Stand Sit to Stand: Independent                Ambulation/Gait Ambulation/Gait assistance: Independent Gait Distance (Feet): 600 Feet Assistive device: None Gait Pattern/deviations: WFL(Within Functional Limits) Gait velocity: decreased     General Gait Details: no LOB noted.   Stairs             Wheelchair Mobility    Modified Rankin (Stroke Patients Only)        Balance Overall balance assessment: Mild deficits observed, not formally tested                                          Cognition Arousal/Alertness: Awake/alert Behavior During Therapy: WFL for tasks assessed/performed Overall Cognitive Status: Within Functional Limits for tasks assessed                                          Exercises      General Comments General comments (skin integrity, edema, etc.): Pt received on 5L at 92%. Pt dropped to 80% on 6L during ambulation. Pt titrated to 8L and able to recover to 95% with pursed lip breathing. Pt left on 5L at 94%.      Pertinent Vitals/Pain Pain Assessment Pain Assessment: No/denies pain    Home Living                          Prior Function            PT Goals (current goals can now be found in the care plan section) Progress towards PT goals: Progressing toward goals    Frequency    Min 1X/week      PT Plan Current  plan remains appropriate    Co-evaluation              AM-PAC PT "6 Clicks" Mobility   Outcome Measure  Help needed turning from your back to your side while in a flat bed without using bedrails?: None Help needed moving from lying on your back to sitting on the side of a flat bed without using bedrails?: None Help needed moving to and from a bed to a chair (including a wheelchair)?: A Little Help needed standing up from a chair using your arms (e.g., wheelchair or bedside chair)?: A Little Help needed to walk in hospital room?: A Little Help needed climbing 3-5 steps with a railing? : A Little 6 Click Score: 20    End of Session Equipment Utilized During Treatment: Oxygen;Gait belt Activity Tolerance: Patient tolerated treatment well Patient left: in bed;with call bell/phone within reach Nurse Communication: Mobility status PT Visit Diagnosis: Other abnormalities of gait and mobility (R26.89)     Time: 5093-2671 PT Time Calculation  (min) (ACUTE ONLY): 11 min  Charges:  $Gait Training: 8-22 mins                     Shela Nevin, PT, DPT Acute Rehab Services [phone]    [name] [name] 06/21/2022, 3:43 PM

## 2022-06-21 NOTE — Hospital Course (Addendum)
[name] [name] was admitted to the hospital with the working diagnosis of heart failure exacerbation.   62/M with history of COPD, chronic respiratory failure on 2 L home O2, paroxysmal A-fib on Eliquis, sleep apnea, obesity, CKD 2, presented to the ED with progressive shortness of breath and edema X 2 to 3 weeks, prescribed Lasix by his PCP few weeks ago. In the ED hypoxic, 81% on room air, placed on 6 L high flow nasal cannula, blood pressure 117/76, HR 80, RR 20. Lungs with no rales or wheezing, heart with S1 and S2 present and rhythmic, abdomen with no distention and positive lower extremity edema.   Na 136, K 4,3 Cl 93 bicarbonate 33, glucose 93 bun 24 cr 1,18. Mag 2,2  BNP 155  Wbc 10,8 hgb 15,8 plt 265  TSH 6,189 free T4 0,79  Sars covid 19 negative   Chest radiograph with cardiomegaly, bilateral hilar vascular congestion, with cephalization of the vasculature, bilateral interstitial infiltrates, predominantly at the lower lobes.   EKG 82 bpm, left axis deviation, normal intervals, sinus rhythm, with left atrial enlargement with no significant ST segment or T wave changes.   CTA chest with chronic residual fibrinous nonocclusive thrombus, new central groundglass opacities in anterior upper lobe, inferior upper lobe concerning for multifocal infectious/inflammatory process  Apical paraseptal emphysematous changes.   -Improving on diuretics slowly 05/16 patient is responding well to diuresis.,

## 2022-06-21 NOTE — Progress Notes (Signed)
Pt able to place self on cpap machine.

## 2022-06-21 NOTE — Progress Notes (Signed)
Patient c/o of difficulty with urination. Reports slow stream and dribbling at times. States " I tried to urinate this morning but nothing came out". Bladder scan revealed 120 mL.

## 2022-06-21 NOTE — Progress Notes (Signed)
Mobility Specialist Progress Note:   06/21/22 0940  Mobility  Activity Ambulated with assistance in hallway  Level of Assistance Standby assist, set-up cues, supervision of patient - no hands on  Assistive Device None  Distance Ambulated (ft) 500 ft  Activity Response Tolerated fair  Mobility Referral Yes  $Mobility charge 1 Mobility  Mobility Specialist Start Time (ACUTE ONLY) 0940  Mobility Specialist Stop Time (ACUTE ONLY) 0955  Mobility Specialist Time Calculation (min) (ACUTE ONLY) 15 min   Pt eager for mobility session. Required no physical assistance. Pt does not understand importance of supplemental O2 (ambulates to BR on RA with no staff assistance). Required 8LO2 to recover from 80% on 6LO2. Pt back sitting EOB with all needs met.  Addison Lank Mobility Specialist Please contact via SecureChat or  Rehab office at [phone]

## 2022-06-21 NOTE — Progress Notes (Signed)
   Heart Failure Stewardship Pharmacist Progress Note   PCP: Kaleen Mask, MD PCP-Cardiologist: Regan Lemming, MD   HPI:  62 YO male with a PMH of pulmonary embolus, COPD, atrial fibrillation on DOAC, OSA, GERD, CKD stage 2, HTN, diastolic CHF, chronic respiratory failure on 2L oxygen [place], and obesity.  Patient presented to the [facility] ED on 5/10 with shortness of breath, chronic back pain, and nonproductive cough (reports recent exposure to pneumonia). Patient also states that his legs started to swell about 2 weeks ago and he was given Lasix by his MD--he reports this has not made a difference in swelling. He describes gaining weight but is eating poorly. Most recent echo from 02/2022 showing EF 60-65%, G1DD, mildly reduced RV, RAP 3 mmHg. Repeat echo done 5/12 showing EF 60-65%, mildly reduced RV, RAP 3 mmHg--no significant changes from prior echo.   Current HF Medications:  Diuretic: furosemide 40 mg IV BID Beta Blocker: metoprolol succinate 25 mg BID MRA: spironolactone 12.5 mg daily SGLT2i: Jardiance 10 mg daily  Prior to admission HF Medications: Diuretic: furosemide 80 mg daily Beta blocker: metoprolol succinate 50 mg BID  Pertinent Lab Values: Serum creatinine 1.21, BUN 26, Potassium 4.0, Sodium 133, BNP 155, Magnesium 2.4  Vital Signs: Weight: 298 lbs (admission weight: 300 lbs) Blood pressure: 110s/60s  Heart rate: 70-80s  I/O: -0.8L yesterday; net -7.5L  Medication Assistance / Insurance Benefits Check: Does the patient have prescription insurance?  Yes Type of insurance plan: Medicaid  Outpatient Pharmacy:  Prior to admission outpatient pharmacy: [facility] Is the patient willing to use [facility] TOC pharmacy at discharge? Yes Is the patient willing to transition their outpatient pharmacy to utilize a [facility] outpatient pharmacy?   Pending   Assessment: 1. Acute on chronic diastolic CHF (LVEF 60-65%. NYHA class III symptoms. - SCr improved 1.32>1.21  from yesterday on furosemide 40 mg IV BID, spironolactone, and Jardiance. Strict I's and O's. Keep K >4 and Mg >2.  - Continue furosemide to 40 mg IV BID. Still with significant peripheral edema but minimal urine output. Recommend TED hose / UNNA boots to help with diuresis. Had patient elevate legs to assist. Is on 2L at home but 5L currently.  - Continue metoprolol succinate 25 mg BID and monitor HR closely  - Continue spironolactone 12.5 mg daily and monitor SCr - Continue Jardiance 10 mg daily and monitor SCr    Plan: 1) Medication changes recommended at this time: - Continue current regimen - Add TED hose / UNNA boots  2) Patient assistance: - Jardiance prior authorization approved - copay $4  3)  Education  - Patient has been educated on current HF medications and potential additions to HF medication regimen - Patient verbalizes understanding that over the next few months, these medication doses may change and more medications may be added to optimize HF regimen - Patient has been educated on basic disease state pathophysiology and goals of therapy   Sharen Hones, PharmD, BCPS Heart Failure Engineer, building services Phone [phone]

## 2022-06-21 NOTE — Progress Notes (Addendum)
Progress Note   Patient: [name] [name] GNF:[id number] DOB: [birth year] DOA: 06/16/2022     4 DOS: the patient was seen and examined on 06/21/2022   Brief hospital course: Mr. Smethers was admitted to the hospital with the working diagnosis of heart failure exacerbation.   62/M with history of COPD, chronic respiratory failure on 2 L home O2, paroxysmal A-fib on Eliquis, sleep apnea, obesity, CKD 2, presented to the ED with progressive shortness of breath and edema X 2 to 3 weeks, prescribed Lasix by his PCP few weeks ago. In the ED hypoxic, 81% on room air, placed on 6 L high flow nasal cannula, blood pressure 117/76, HR 80, RR 20. Lungs with no rales or wheezing, heart with S1 and S2 present and rhythmic, abdomen with no distention and positive lower extremity edema.   Na 136, K 4,3 Cl 93 bicarbonate 33, glucose 93 bun 24 cr 1,18. Mag 2,2  BNP 155  Wbc 10,8 hgb 15,8 plt 265  TSH 6,189 free T4 0,79  Sars covid 19 negative   Chest radiograph with cardiomegaly, bilateral hilar vascular congestion, with cephalization of the vasculature, bilateral interstitial infiltrates, predominantly at the lower lobes.   EKG 82 bpm, left axis deviation, normal intervals, sinus rhythm, with left atrial enlargement with no significant ST segment or T wave changes.   CTA chest with chronic residual fibrinous nonocclusive thrombus, new central groundglass opacities in anterior upper lobe, inferior upper lobe concerning for multifocal infectious/inflammatory process  Apical paraseptal emphysematous changes.   -Improving on diuretics slowly  Assessment and Plan: * Acute on chronic diastolic CHF (congestive heart failure) (HCC) Echocardiogram with preserved LV systolic function, EF 60 to 65%, RV with mild enlargement, with preserved systolic function, trivial pericardial effusion.   Urine output 2,500 cc Systolic blood pressure is 115 to 117 mmHg.   Plan to continue furosemide 40 mg IV q12 hrs.   Empagliflozin, spironolactone.  Metoprolol.  Possible addition of ARB with blood pressure stable.   Acute on chronic hypoxemic respiratory failure, due to cardiogenic pulmonary edema,  Pneumonia has been ruled out. 02 saturation is 92 on 4 L/min per [facility]NC.   Prolonged QT interval    Low back pain DDD with mild disc bulge at L3-L4 and L4-L5 with mild lateral recess stenosis bilaterally at L3-L4 and L4-L5. No significant neural foraminal stenosis.  -no acute findings or red flags on exam  -weight loss and PT would be beneficial -will do lidocaine patch and PRN tramadol for now -trial gabapentin  -check TSH/vitamin D  -weight loss will be life changing  -outpatient f/u   COPD (chronic obstructive pulmonary disease) (HCC) No signs of exacerbation on exam. PFT in 2023: severe obstruction, positive bronchodilator response, air trapping, and moderate diffusion defect.  -continue [place] BID, singulair and SABA prn  Patient with chronic right middle lobe atelectasis.   Generalized anxiety disorder Start hydroxyzine PRN Outpatient management with PCP   Persistent atrial fibrillation (HCC) continue eliquis, tikosyn and toprol XL   PE (pulmonary thromboembolism) (HCC) No acute PE on CTA Continue eliquis BID   CKD (chronic kidney disease), stage II Hyponatremia.   Renal function with serum cr at 1,21 with K at 4,0 and serum bicarbonate at 33. Na is 133 and Mg 2,4   Plan to continue diuresis with furosemide, spironolactone and empagliflozin. .  Follow up renal function in am.   OSA (obstructive sleep apnea) Has been off of his cpap x 1 year, likely contributing to this  Continue  cpap at night   Obesity class 3, with calculated BMI of 48,2   Chronic back pain-resolved as of 06/17/2022 Chronic issue Exam with no red flags CT lumbar spine with no acute findings, DDD Start lidocaine patch  Tramadol prn  Never has tried gabapentin, trial this here.  PT eval          Subjective: Patient with improvement in dyspnea, but not yet back to baseline, no chest pain, continue very weak and deconditioned   Physical Exam: Vitals:   06/21/22 0050 06/21/22 0432 06/21/22 0733 06/21/22 0757  BP: 117/71 (!) 117/59  115/71  Pulse: 80 75  78  Resp: 18 18  18   Temp: 97.7 F (36.5 C)   97.9 F (36.6 C)  TempSrc: Oral Oral    SpO2: 90% 90% 92% 90%  Weight:  135.4 kg    Height:       Neurology awake and alert ENT with mild pallor Cardiovascular with S1 and S2 present and rhythmic with no gallops, rubs or murmurs No JVD Positive lower extremity edema, trace Respiratory with positive expiratory wheezing and prolonged expiratory phase Abdomen with no distention  Data Reviewed:    Family Communication: no family at the bedside   Disposition: Status is: Inpatient Remains inpatient appropriate because: IV diuresis   Planned Discharge Destination: Home      Author: Coralie Keens, MD 06/21/2022 11:51 AM  For on call review [email or web address].

## 2022-06-21 NOTE — Plan of Care (Signed)
Nutrition Education Note  RD consulted for nutrition education regarding acute on chronic CHF.  RD provided "Heart Failure Nutrition Therapy" and "Heart Healthy Nutrition Therapy" handout from the Academy of [employer] Dietetics. Reviewed patient's dietary recall which includes 3 meals daily. His girlfriend prepares most meals. He usually eats 1 packet of grits,1 egg, [employer] sausage/bacon or a bacon egg and cheese biscuit for breakfast. Lunch may include a sandwich (tuna) or leftovers from dinner. Dinner varies but may include items such as fajitas, burritos, or a variation of soul food. They occasionally go to the Pub. He endorses beverage intake of cherry Pepsi, water and beer.   Provided examples on ways to decrease sodium intake in diet. Discouraged intake of processed foods and use of salt shaker which pt recalls not adding extra salt to foods. Encouraged fresh fruits and vegetables as well as whole grain sources of carbohydrates to maximize fiber intake.   RD discussed why it is important for patient to adhere to diet recommendations, and emphasized the role of fluids, foods to avoid, and importance of weighing self daily. Teach back method used.  Expect good compliance as pt is motivated to feel better.  Body mass index is 48.2 kg/m. Pt meets criteria for morbid obesity based on current BMI.  Current diet order is Heart Healthy/Carb Modified, patient is consuming approximately 100% of meals at this time. Labs and medications reviewed. No further nutrition interventions warranted at this time. RD contact information provided. If additional nutrition issues arise, please re-consult RD.   Drusilla Kanner, RDN, LDN Clinical Nutrition

## 2022-06-22 DIAGNOSIS — N182 Chronic kidney disease, stage 2 (mild): Secondary | ICD-10-CM | POA: Diagnosis not present

## 2022-06-22 DIAGNOSIS — J439 Emphysema, unspecified: Secondary | ICD-10-CM

## 2022-06-22 DIAGNOSIS — I5033 Acute on chronic diastolic (congestive) heart failure: Secondary | ICD-10-CM | POA: Diagnosis not present

## 2022-06-22 DIAGNOSIS — I4819 Other persistent atrial fibrillation: Secondary | ICD-10-CM | POA: Diagnosis not present

## 2022-06-22 DIAGNOSIS — I2699 Other pulmonary embolism without acute cor pulmonale: Secondary | ICD-10-CM | POA: Diagnosis not present

## 2022-06-22 LAB — BASIC METABOLIC PANEL
Anion gap: 10 (ref 5–15)
BUN: 27 mg/dL — ABNORMAL HIGH (ref 6–20)
CO2: 32 mmol/L (ref 22–32)
Calcium: 8.7 mg/dL — ABNORMAL LOW (ref 8.9–10.3)
Chloride: 90 mmol/L — ABNORMAL LOW (ref 98–111)
Creatinine, Ser: 1.18 mg/dL (ref 0.61–1.24)
GFR, Estimated: >60 mL/min (ref >60)
Glucose, Bld: 137 mg/dL — ABNORMAL HIGH (ref 70–99)
Potassium: 3.5 mmol/L (ref 3.5–5.1)
Sodium: 132 mmol/L — ABNORMAL LOW (ref 135–145)

## 2022-06-22 MED ORDER — POTASSIUM CHLORIDE CRYS ER 20 MEQ PO TBCR
40.0000 meq | EXTENDED_RELEASE_TABLET | Freq: Once | ORAL | Status: AC
  Administered 2022-06-22: 40 meq via ORAL
  Filled 2022-06-22: qty 2

## 2022-06-22 NOTE — Progress Notes (Signed)
   Heart Failure Stewardship Pharmacist Progress Note   PCP: Kaleen Mask, MD PCP-Cardiologist: Regan Lemming, MD   HPI:  62 YO male with a PMH of pulmonary embolus, COPD, atrial fibrillation on DOAC, OSA, GERD, CKD stage 2, HTN, diastolic CHF, chronic respiratory failure on 2L oxygen [place], and obesity.  Patient presented to the [facility] ED on 5/10 with shortness of breath, chronic back pain, and nonproductive cough (reports recent exposure to pneumonia). Patient also states that his legs started to swell about 2 weeks ago and he was given Lasix by his MD--he reports this has not made a difference in swelling. He describes gaining weight but is eating poorly. Most recent echo from 02/2022 showing EF 60-65%, G1DD, mildly reduced RV, RAP 3 mmHg. Repeat echo done 5/12 showing EF 60-65%, mildly reduced RV, RAP 3 mmHg--no significant changes from prior echo.   Current HF Medications:  Diuretic: furosemide 40 mg IV BID Beta Blocker: metoprolol succinate 25 mg BID MRA: spironolactone 12.5 mg daily SGLT2i: Jardiance 10 mg daily  Prior to admission HF Medications: Diuretic: furosemide 80 mg daily Beta blocker: metoprolol succinate 50 mg BID  Pertinent Lab Values: Serum creatinine 1.18, BUN 27, Potassium 3.5, Sodium 132, BNP 155, Magnesium 2.4  Vital Signs: Weight: 297 lbs (admission weight: 300 lbs) Blood pressure: 120s/70s Heart rate: 70-80s  I/O: -2.2L yesterday; net -9.1L  Medication Assistance / Insurance Benefits Check: Does the patient have prescription insurance?  Yes Type of insurance plan: Medicaid  Outpatient Pharmacy:  Prior to admission outpatient pharmacy: [facility] Is the patient willing to use [facility] TOC pharmacy at discharge? Yes Is the patient willing to transition their outpatient pharmacy to utilize a [facility] outpatient pharmacy?   Pending   Assessment: 1. Acute on chronic diastolic CHF (LVEF 60-65%. NYHA class III symptoms. - SCr improved 1.21>>1.18  from yesterday on furosemide 40 mg IV BID, spironolactone, and Jardiance. Strict I's and O's. Keep K >4 and Mg >2.  Recommend supplementing K today.  - Continue furosemide to 40 mg IV BID given good UOP yesterday, stable Scr and continued peripheral edema. Continue TED hose / UNNA boots to help with diuresis. Have patient elevate legs to assist. Is on 2L at home but 5L currently.  - Continue metoprolol succinate 25 mg BID and monitor HR closely  - Continue spironolactone 12.5 mg daily and monitor SCr - Continue Jardiance 10 mg daily and monitor SCr  - BP low-soft so would continue holding off on starting ACEi/ARB/ARNi   Plan: 1) Medication changes recommended at this time: - Continue current regimen - Give KCl 40 mEq PO x1   2) Patient assistance: - Jardiance prior authorization approved - copay $4  3)  Education  - Patient has been educated on current HF medications and potential additions to HF medication regimen - Patient verbalizes understanding that over the next few months, these medication doses may change and more medications may be added to optimize HF regimen - Patient has been educated on basic disease state pathophysiology and goals of therapy   Cherylin Mylar, PharmD PGY1 Pharmacy Resident 5/16/20248:54 AM

## 2022-06-22 NOTE — Progress Notes (Signed)
Patient cpap on standby at bedside, says he will use if he needs it but is comfortable with medium flow cannula.

## 2022-06-22 NOTE — Progress Notes (Signed)
Mobility Specialist Progress Note:   06/22/22 1548  Mobility  Activity Ambulated with assistance in hallway  Level of Assistance Standby assist, set-up cues, supervision of patient - no hands on  Assistive Device None  Distance Ambulated (ft) 500 ft  Activity Response Tolerated well  Mobility Referral Yes  $Mobility charge 1 Mobility  Mobility Specialist Start Time (ACUTE ONLY) 1534  Mobility Specialist Stop Time (ACUTE ONLY) 1545  Mobility Specialist Time Calculation (min) (ACUTE ONLY) 11 min   Pt recieved at EOB very eager to ambulate. Pt took x1 standing break to due to pt sats decreasing to 84% on 6L/min. Increased flow to 8L/min w/ sats maintaining >90% throughout remainder of session. Pt c/o back and hip pain no rating given. Pt returned to EOB with call light and coffee at hand.  Pre Mobility SPO2 84% During Mobility     6L/min; SPO2 84% Hr 98     8L/min; SPO2 91%  Post Mobility 5L/min; SPO2 95% Hr 85  Thompson Grayer Mobility Specialist  Please Engineer, structural or  Rehab Office [phone]

## 2022-06-22 NOTE — Plan of Care (Signed)
  Problem: Clinical Measurements: Goal: Respiratory complications will improve Outcome: Progressing   Problem: Safety: Goal: Ability to remain free from injury will improve Outcome: Progressing   

## 2022-06-22 NOTE — Progress Notes (Signed)
Progress Note   Patient: [name] [name] ZOX:[id number] DOB: 08-24-1960 DOA: 06/16/2022     5 DOS: the patient was seen and examined on 06/22/2022   Brief hospital course: [name] [name] was admitted to the hospital with the working diagnosis of heart failure exacerbation.   62/M with history of COPD, chronic respiratory failure on 2 L home O2, paroxysmal A-fib on Eliquis, sleep apnea, obesity, CKD 2, presented to the ED with progressive shortness of breath and edema X 2 to 3 weeks, prescribed Lasix by his PCP few weeks ago. In the ED hypoxic, 81% on room air, placed on 6 L high flow nasal cannula, blood pressure 117/76, HR 80, RR 20. Lungs with no rales or wheezing, heart with S1 and S2 present and rhythmic, abdomen with no distention and positive lower extremity edema.   Na 136, K 4,3 Cl 93 bicarbonate 33, glucose 93 bun 24 cr 1,18. Mag 2,2  BNP 155  Wbc 10,8 hgb 15,8 plt 265  TSH 6,189 free T4 0,79  Sars covid 19 negative   Chest radiograph with cardiomegaly, bilateral hilar vascular congestion, with cephalization of the vasculature, bilateral interstitial infiltrates, predominantly at the lower lobes.   EKG 82 bpm, left axis deviation, normal intervals, sinus rhythm, with left atrial enlargement with no significant ST segment or T wave changes.   CTA chest with chronic residual fibrinous nonocclusive thrombus, new central groundglass opacities in anterior upper lobe, inferior upper lobe concerning for multifocal infectious/inflammatory process  Apical paraseptal emphysematous changes.   -Improving on diuretics slowly 05/16 patient is responding well to diuresis.,   Assessment and Plan: * Acute on chronic diastolic CHF (congestive heart failure) (HCC) Echocardiogram with preserved LV systolic function, EF 60 to 65%, RV with mild enlargement, with preserved systolic function, trivial pericardial effusion.   Urine output 3,450 cc Systolic blood pressure is 120 to 123 mmHg.   Plan to  continue furosemide 40 mg IV q12 hrs.  Empagliflozin, spironolactone.  Metoprolol.  Possible addition of ARB with blood pressure stable.   Acute on chronic hypoxemic respiratory failure, due to cardiogenic pulmonary edema,  Pneumonia has been ruled out. 02 saturation is 92 on 5 L/min per [place].  Continue to titrate to 2 to 3 L of supplemental 02 per his home regimen.    Persistent atrial fibrillation (HCC) Continue rate and rhythm control with dofetilide, metoprolol succinate.  Anticoagulation with apixaban.   PE (pulmonary thromboembolism) (HCC) No acute PE on CTA Continue eliquis BID   COPD (chronic obstructive pulmonary disease) (HCC) No signs of exacerbation on exam. Continue bronchodilator therapy, and inhaled corticosteroids.  Supplemental 02 per [place] at least 12 hrs per day.   CKD (chronic kidney disease), stage II Hyponatremia.   Volume status continue to improve. Renal function with serum cr at 1,18 with K at 3,5 and serum bicarbonate at 32.  Na 132.   Plan to continue diuresis with furosemide, spironolactone and empagliflozin. .  Follow up renal function in am.   Generalized anxiety disorder Start hydroxyzine PRN Outpatient management with PCP   Low back pain DDD with mild disc bulge at L3-L4 and L4-L5 with mild lateral recess stenosis bilaterally at L3-L4 and L4-L5. No significant neural foraminal stenosis.   Continue analgesics, PT and OT. Out of bed.   OSA (obstructive sleep apnea) Has been off of his cpap x 1 year, likely contributing to this  Continue cpap at night   Obesity class 3, with calculated BMI of 48,2   Chronic back  pain-resolved as of 06/17/2022 Chronic issue Exam with no red flags CT lumbar spine with no acute findings, DDD Start lidocaine patch  Tramadol prn  Never has tried gabapentin, trial this here.  PT eval         Subjective: Patient with improvement in dyspnea and edema, continue to have some abdominal edema, no chest pain,  mobility is limited due to back pain.,   Physical Exam: Vitals:   06/22/22 0001 06/22/22 0449 06/22/22 0806 06/22/22 0821  BP: 114/75 123/61 122/70   Pulse: 86 72 83   Resp: 19 20 18    Temp: 97.9 F (36.6 C) 98.1 F (36.7 C) 97.8 F (36.6 C)   TempSrc: Oral Oral Oral   SpO2: 90% 90% 90% 92%  Weight:  134.9 kg    Height:       Neurology awake and alert ENT with no pallor Cardiovascular with S1 and S2 present and rhythmic with no gallops, rubs or murmurs No JVD Trace lower extremity edema, ted hose in place. Mild flank edema  Respiratory with no rales or wheezing. No rhonchi.  Abdomen with no distention non tender Data Reviewed:    Family Communication: no family at the bedside   Disposition: Status is: Inpatient Remains inpatient appropriate because: heart failure   Planned Discharge Destination: Home      Author: Coralie Keens, MD 06/22/2022 11:50 AM  For on call review [email or web address].

## 2022-06-22 NOTE — Evaluation (Signed)
Occupational Therapy Evaluation Patient Details Name: [name] [name] MRN: [id number] DOB: Jul 04, 1960 Today's Date: 06/22/2022   History of Present Illness The pt is a 62 yo male presenting 5/10 with SOB. Found to have COPD exacerbation with hypoxia. PMH includes: CHF, COPD, HTN, obesity, OSA, and afib.   Clinical Impression   Pt presents with decline in function and safety with ADLs and ADL mobility with impaired endurance. PTA pt lived at home with girlfriend and was Ind with ADLs/selfcare with occasional assist with socks and shoes, increased time to complete bathing and dressing due to DOE and used no AD/DME for mobility but reports to furniture surfing depending on distance he has to walk. Pt currently requires Sup for safety with VSs due to DOE, pt on 5L O2 (2L home O2). OT initiated energy conservation trg and A/E. Pt's O2 SATs remained >90% throughout in room activity. Pt would benefit from acute OT services to address impairment to maximize level of function and safety to return home      Recommendations for follow up therapy are one component of a multi-disciplinary discharge planning process, led by the attending physician.  Recommendations may be updated based on patient status, additional functional criteria and insurance authorization.   Assistance Recommended at Discharge PRN  Patient can return home with the following A little help with bathing/dressing/bathroom;Assistance with cooking/housework;Assist for transportation    Functional Status Assessment  Patient has had a recent decline in their functional status and demonstrates the ability to make significant improvements in function in a reasonable and predictable amount of time.  Equipment Recommendations  Tub/shower bench;Other (comment) (reacher, long handle bath sponge, sock aid)    Recommendations for Other Services       Precautions / Restrictions Precautions Precautions: Fall Precaution Comments: watch O2, 5L  O2 Restrictions Weight Bearing Restrictions: No      Mobility Bed Mobility Overal bed mobility: Independent                  Transfers Overall transfer level: Independent Equipment used: None   Sit to Stand: Independent           General transfer comment: supervision for safety, VSS      Balance                                           ADL either performed or assessed with clinical judgement   ADL Overall ADL's : Needs assistance/impaired Eating/Feeding: Minimal assistance   Grooming: Wash/dry hands;Wash/dry face;Supervision/safety;Standing   Upper Body Bathing: Set up   Lower Body Bathing: Supervison/ safety   Upper Body Dressing : Set up   Lower Body Dressing: Supervision/safety   Toilet Transfer: Supervision/safety;Ambulation;Regular Toilet   Toileting- Architect and Hygiene: Independent       Functional mobility during ADLs: Supervision/safety General ADL Comments: Sup for safety, VSs. Initiated energy conservation and A/E education with pt     Vision Ability to See in Adequate Light: 0 Adequate Patient Visual Report: No change from baseline       Perception     Praxis      Pertinent Vitals/Pain Pain Assessment Pain Assessment: 0-10 Pain Score: 8  Pain Location: back (chronic 8/10) and hips/LEs 5/10 (dull) Pain Descriptors / Indicators: Dull, Aching Pain Intervention(s): Premedicated before session, Monitored during session     Hand Dominance Right   Extremity/Trunk Assessment Upper Extremity  Assessment Upper Extremity Assessment: Overall WFL for tasks assessed   Lower Extremity Assessment Lower Extremity Assessment: Defer to PT evaluation   Cervical / Trunk Assessment Cervical / Trunk Assessment: Normal   Communication Communication Communication: No difficulties   Cognition Arousal/Alertness: Awake/alert Behavior During Therapy: WFL for tasks assessed/performed Overall Cognitive Status:  Within Functional Limits for tasks assessed                                       General Comments       Exercises     Shoulder Instructions      Home Living Family/patient expects to be discharged to:: Private residence Living Arrangements: Spouse/significant other Available Help at Discharge: Family;Available 24 hours/day Type of Home: House Home Access: Stairs to enter [employer] of Steps: 4 Entrance Stairs-Rails: None Home Layout: One level     Bathroom Shower/Tub: Chief Strategy Officer: Handicapped height     Home Equipment: None          Prior Functioning/Environment Prior Level of Function : Driving;Needs assist             Mobility Comments: no use of DME, poor endurance. reports he has O2 at home for when he sleeps and is sitting on the couch, sounds like he needs it with activity but has poor medical understanding. ADLs Comments: Pt Ind with ADLs/selfcare, IADLs, drives; reports poor endurance and increased time required to complete ADL tasks, assist with donning socks and shoes at times        OT Problem List: Decreased activity tolerance      OT Treatment/Interventions: Self-care/ADL training;Energy conservation;DME and/or AE instruction;Therapeutic activities;Patient/family education    OT Goals(Current goals can be found in the care plan section) Acute Rehab OT Goals Patient Stated Goal: go home OT Goal Formulation: With patient Time For Goal Achievement: 07/06/22 Potential to Achieve Goals: Good ADL Goals Pt Will Perform Lower Body Bathing: with set-up;with modified independence;with adaptive equipment Pt Will Perform Lower Body Dressing: with set-up;with modified independence;with adaptive equipment Pt Will Perform Tub/Shower Transfer: with supervision;with modified independence;ambulating;tub bench Additional ADL Goal #1: Pt will verbalize and demo 3 energy conservation techniques for ADLs and ADL  mobility  OT Frequency: Min 2X/week    Co-evaluation              AM-PAC OT "6 Clicks" Daily Activity     Outcome Measure Help from another person eating meals?: None Help from another person taking care of personal grooming?: A Little Help from another person toileting, which includes using toliet, bedpan, or urinal?: None Help from another person bathing (including washing, rinsing, drying)?: A Little Help from another person to put on and taking off regular upper body clothing?: None Help from another person to put on and taking off regular lower body clothing?: A Little 6 Click Score: 21   End of Session    Activity Tolerance: Patient tolerated treatment well Patient left: in bed (sitting EOB)  OT Visit Diagnosis: Other abnormalities of gait and mobility (R26.89)                Time: 1610-9604 OT Time Calculation (min): 24 min Charges:  OT General Charges $OT Visit: 1 Visit OT Evaluation $OT Eval Low Complexity: 1 Low OT Treatments $Self Care/Home Management : 8-22 mins   Galen Manila 06/22/2022, 1:26 PM

## 2022-06-23 ENCOUNTER — Other Ambulatory Visit (HOSPITAL_COMMUNITY): Payer: Self-pay

## 2022-06-23 DIAGNOSIS — J439 Emphysema, unspecified: Secondary | ICD-10-CM | POA: Diagnosis not present

## 2022-06-23 DIAGNOSIS — I5033 Acute on chronic diastolic (congestive) heart failure: Secondary | ICD-10-CM | POA: Diagnosis not present

## 2022-06-23 DIAGNOSIS — I4819 Other persistent atrial fibrillation: Secondary | ICD-10-CM | POA: Diagnosis not present

## 2022-06-23 DIAGNOSIS — I2699 Other pulmonary embolism without acute cor pulmonale: Secondary | ICD-10-CM | POA: Diagnosis not present

## 2022-06-23 LAB — BASIC METABOLIC PANEL
Anion gap: 8 (ref 5–15)
BUN: 24 mg/dL — ABNORMAL HIGH (ref 6–20)
CO2: 34 mmol/L — ABNORMAL HIGH (ref 22–32)
Calcium: 8.7 mg/dL — ABNORMAL LOW (ref 8.9–10.3)
Chloride: 92 mmol/L — ABNORMAL LOW (ref 98–111)
Creatinine, Ser: 1.22 mg/dL (ref 0.61–1.24)
GFR, Estimated: >60 mL/min (ref >60)
Glucose, Bld: 121 mg/dL — ABNORMAL HIGH (ref 70–99)
Potassium: 3.9 mmol/L (ref 3.5–5.1)
Sodium: 134 mmol/L — ABNORMAL LOW (ref 135–145)

## 2022-06-23 MED ORDER — POTASSIUM CHLORIDE CRYS ER 20 MEQ PO TBCR
20.0000 meq | EXTENDED_RELEASE_TABLET | Freq: Once | ORAL | Status: AC
  Administered 2022-06-23: 20 meq via ORAL
  Filled 2022-06-23: qty 1

## 2022-06-23 MED ORDER — SUCRALFATE 1 GM/10ML PO SUSP: 1.0000 g | Freq: Three times a day (TID) | ORAL | Status: DC

## 2022-06-23 MED ORDER — GABAPENTIN 100 MG PO CAPS
100.0000 mg | ORAL_CAPSULE | Freq: Three times a day (TID) | ORAL | 0 refills | Status: DC | PRN
  Filled 2022-06-23 (×2): qty 90, 30d supply, fill #0

## 2022-06-23 MED ORDER — SPIRONOLACTONE 25 MG PO TABS
12.5000 mg | ORAL_TABLET | Freq: Every day | ORAL | 0 refills | Status: DC
  Filled 2022-06-23: qty 15, 30d supply, fill #0

## 2022-06-23 MED ORDER — TORSEMIDE 20 MG PO TABS
40.0000 mg | ORAL_TABLET | Freq: Every day | ORAL | 0 refills | Status: DC
  Filled 2022-06-23: qty 60, 30d supply, fill #0

## 2022-06-23 MED ORDER — ALPRAZOLAM 0.25 MG PO TABS: 0.2500 mg | ORAL_TABLET | Freq: Three times a day (TID) | ORAL | Status: DC | PRN

## 2022-06-23 MED ORDER — ALPRAZOLAM 0.5 MG PO TABS
0.5000 mg | ORAL_TABLET | Freq: Once | ORAL | Status: AC
  Administered 2022-06-23: 0.5 mg via ORAL
  Filled 2022-06-23: qty 1

## 2022-06-23 MED ORDER — EMPAGLIFLOZIN 10 MG PO TABS
10.0000 mg | ORAL_TABLET | Freq: Every day | ORAL | 0 refills | Status: DC
  Filled 2022-06-23: qty 30, 30d supply, fill #0

## 2022-06-23 MED ORDER — ALPRAZOLAM 0.25 MG PO TABS
0.2500 mg | ORAL_TABLET | Freq: Three times a day (TID) | ORAL | 0 refills | Status: AC | PRN
  Filled 2022-06-23: qty 10, 4d supply, fill #0

## 2022-06-23 MED ORDER — TORSEMIDE 20 MG PO TABS
40.0000 mg | ORAL_TABLET | Freq: Every day | ORAL | Status: DC
  Administered 2022-06-23: 40 mg via ORAL
  Filled 2022-06-23: qty 2

## 2022-06-23 NOTE — Progress Notes (Signed)
   Heart Failure Stewardship Pharmacist Progress Note   PCP: Kaleen Mask, MD PCP-Cardiologist: Regan Lemming, MD   HPI:  62 YO male with a PMH of pulmonary embolus, COPD, atrial fibrillation on DOAC, OSA, GERD, CKD stage 2, HTN, diastolic CHF, chronic respiratory failure on 2L oxygen [place], and obesity.  Patient presented to the [facility] ED on 5/10 with shortness of breath, chronic back pain, and nonproductive cough (reports recent exposure to pneumonia). Patient also states that his legs started to swell about 2 weeks ago and he was given Lasix by his MD--he reports this has not made a difference in swelling. He describes gaining weight but is eating poorly. Most recent echo from 02/2022 showing EF 60-65%, G1DD, mildly reduced RV, RAP 3 mmHg. Repeat echo done 5/12 showing EF 60-65%, mildly reduced RV, RAP 3 mmHg--no significant changes from prior echo.   Current HF Medications:  Diuretic: furosemide 40 mg IV BID Beta Blocker: metoprolol succinate 25 mg BID MRA: spironolactone 12.5 mg daily SGLT2i: Jardiance 10 mg daily  Prior to admission HF Medications: Diuretic: furosemide 80 mg daily Beta blocker: metoprolol succinate 50 mg BID  Pertinent Lab Values: Serum creatinine 1.22, BUN 24, Potassium 3.9, Sodium 134, BNP 155, Magnesium 2.4  Vital Signs: Weight: 297 lbs (admission weight: 300 lbs) Blood pressure: 120/70s Heart rate: 70-80s  I/O: -2.7L yesterday; net -11.8L  Medication Assistance / Insurance Benefits Check: Does the patient have prescription insurance?  Yes Type of insurance plan: Medicaid  Outpatient Pharmacy:  Prior to admission outpatient pharmacy: [facility] Is the patient willing to use [facility] TOC pharmacy at discharge? Yes Is the patient willing to transition their outpatient pharmacy to utilize a [facility] outpatient pharmacy?   No   Assessment: 1. Acute on chronic diastolic CHF (LVEF 60-65%. NYHA class III symptoms. - SCr stable at 1.22 (BL 1-1.2)  on furosemide 40 mg IV BID, spironolactone, and Jardiance. Strict I's and O's. Keep K >4 and Mg >2.  - Agree with transitioning from IV furosemide to torsemide 40 mg daily.  - Continue metoprolol succinate 25 mg BID and monitor HR closely  - Continue spironolactone 12.5 mg daily and monitor SCr - Continue Jardiance 10 mg daily and monitor SCr  - Consider starting losartan 25 mg outpatient in setting of improved BP   Plan: 1) Medication changes recommended at this time: - Discontinue IV furosemide  - Start torsemide 40 mg daily  - Consider starting losartan 25 mg outpatient in setting of improved BP   2) Patient assistance: - Jardiance prior authorization approved - copay $4  3)  Education  - Patient has been educated on current HF medications and potential additions to HF medication regimen - Patient verbalizes understanding that over the next few months, these medication doses may change and more medications may be added to optimize HF regimen - Patient has been educated on basic disease state pathophysiology and goals of therapy   Cherylin Mylar, PharmD PGY1 Pharmacy Resident 5/17/20249:09 AM

## 2022-06-23 NOTE — Plan of Care (Signed)
  Problem: Clinical Measurements: Goal: Diagnostic test results will improve Outcome: Progressing Goal: Respiratory complications will improve Outcome: Progressing   

## 2022-06-23 NOTE — Progress Notes (Signed)
Occupational Therapy Treatment Patient Details Name: [name] [name] MRN: [id number] DOB: Aug 06, 1960 Today's Date: 06/23/2022   History of present illness The pt is a 62 yo male presenting 5/10 with SOB. Found to have COPD exacerbation with hypoxia. PMH includes: CHF, COPD, HTN, obesity, OSA, and afib.   OT comments  Educated pt on Energy conservation adaptive strategies and the use of the 4ps of energy conservation in preparation for DC home. Pt receptive to education and verbalized understanding with hope to become more active at home. Reinforced CHF diet with compensatory methods for eating out if desired, pt verbalized understanding. OT to continue to progress pt as able. DC plans remain appropriate for no follow-up.    Recommendations for follow up therapy are one component of a multi-disciplinary discharge planning process, led by the attending physician.  Recommendations may be updated based on patient status, additional functional criteria and insurance authorization.    Assistance Recommended at Discharge PRN  Patient can return home with the following  Assistance with cooking/housework;Assist for transportation   Equipment Recommendations  Tub/shower bench;Other (comment) (reacher, long handle bath sponge, sock aid)    Recommendations for Other Services      Precautions / Restrictions Precautions Precautions: Fall Precaution Comments: watch O2, 5L O2 Restrictions Weight Bearing Restrictions: No       Mobility Bed Mobility               General bed mobility comments: pt rec'd and left sitting EOB    Transfers                         Balance                                           ADL either performed or assessed with clinical judgement   ADL                                         General ADL Comments: focused session on education of CHF and energy conservation with strategies    Extremity/Trunk  Assessment              Vision       Perception     Praxis      Cognition Arousal/Alertness: Awake/alert Behavior During Therapy: WFL for tasks assessed/performed Overall Cognitive Status: Within Functional Limits for tasks assessed                                          Exercises      Shoulder Instructions       General Comments Pt verbalized understanding of 02 tank use at home, verbalized steps to initiate tanks    Pertinent Vitals/ Pain       Pain Assessment Pain Assessment: No/denies pain  Home Living                                          Prior Functioning/Environment              Frequency  Min 2X/week  Progress Toward Goals  OT Goals(current goals can now be found in the care plan section)  Progress towards OT goals: Progressing toward goals  Acute Rehab OT Goals Patient Stated Goal: go home OT Goal Formulation: With patient Time For Goal Achievement: 07/06/22 Potential to Achieve Goals: Good  Plan Discharge plan remains appropriate;Frequency remains appropriate    Co-evaluation                 AM-PAC OT "6 Clicks" Daily Activity     Outcome Measure   Help from another person eating meals?: None Help from another person taking care of personal grooming?: A Little Help from another person toileting, which includes using toliet, bedpan, or urinal?: None Help from another person bathing (including washing, rinsing, drying)?: A Little Help from another person to put on and taking off regular upper body clothing?: None Help from another person to put on and taking off regular lower body clothing?: A Little 6 Click Score: 21    End of Session    OT Visit Diagnosis: Other abnormalities of gait and mobility (R26.89)   Activity Tolerance Patient tolerated treatment well   Patient Left in bed (sitting EOB)   Nurse Communication Mobility status        Time: 1610-9604 OT Time  Calculation (min): 11 min  Charges: OT General Charges $OT Visit: 1 Visit OT Treatments $Therapeutic Activity: 8-22 mins  06/23/2022  AB, OTR/L  Acute Rehabilitation Services  Office: [phone]   Tristan Schroeder 06/23/2022, 11:57 AM

## 2022-06-23 NOTE — Progress Notes (Signed)
Mobility Specialist Progress Note:   06/23/22 0900  Mobility  Activity Ambulated independently in hallway;Ambulated with assistance in hallway  Level of Assistance Standby assist, set-up cues, supervision of patient - no hands on  Assistive Device None  Distance Ambulated (ft) 500 ft  Activity Response Tolerated well  Mobility Referral Yes  $Mobility charge 1 Mobility  Mobility Specialist Start Time (ACUTE ONLY) 0900  Mobility Specialist Stop Time (ACUTE ONLY) 0910  Mobility Specialist Time Calculation (min) (ACUTE ONLY) 10 min   Pt eager for mobility session. Required no physical assistance throughout ambulation. 6-8LO2 required to maintain SpO2 WFL. Pt back sitting EOB with all needs met.   Addison Lank Mobility Specialist Please contact via SecureChat or  Rehab office at [phone]

## 2022-06-23 NOTE — Progress Notes (Signed)
Physical Therapy Treatment Patient Details Name: [name] [name] MRN: [id number] DOB: 07-17-1960 Today's Date: 06/23/2022   History of Present Illness The pt is a 62 yo male presenting 5/10 with SOB. Found to have COPD exacerbation with hypoxia. PMH includes: CHF, COPD, HTN, obesity, OSA, and afib.    PT Comments    Pt received in supine, agreeable to therapy session for additional gait trial in room/hallway due to previous O2 saturation note indicated need for 8L/min supplemental O2 and previously, pt only used O2 at night. Pt SpO2 monitored and pt desat to 82% on 6L O2 [place] with gait trial, improved to ~86% with standing breaks and pursed-lip breathing briefly but again desat with exertion/postural change. Pt also c/o severe back pain. Pt defers stair trial prior to DC but was receptive to instruction on seated/standing BLE exercises for strengthening/safety instruction and use of IS. Emphasis on acitivty pacing and energy conservation throughout, pt needing Supervision at most to complete all functional mobility tasks. RN notified pt will need HFNC tubing prior to DC in order to use 8L O2 [place] with exertion as standard bore [place] tubing only allows for 6L/min O2 delivery and pt hypoxic on 6L. Case mgmt also notified. Pt continues to benefit from PT services to progress toward functional mobility goals.    Recommendations for follow up therapy are one component of a multi-disciplinary discharge planning process, led by the attending physician.  Recommendations may be updated based on patient status, additional functional criteria and insurance authorization.  Follow Up Recommendations       Assistance Recommended at Discharge Frequent or constant Supervision/Assistance  Patient can return home with the following Help with stairs or ramp for entrance   Equipment Recommendations  None recommended by PT (supplemental O2 needs with exertion, would benefit from rollator for energy conservation (already  delivered to his room))    Recommendations for Other Services       Precautions / Restrictions Precautions Precautions: Fall Precaution Comments: watch O2, 5L O2 only used at night previously Restrictions Weight Bearing Restrictions: No     Mobility  Bed Mobility Overal bed mobility: Modified Independent             General bed mobility comments: supine to EOB using bed rail and HOB elevated; encouraged him to consider using wedge pillow at home and pt agreeable to look into this.    Transfers Overall transfer level: Modified independent   Transfers: Sit to/from Stand             General transfer comment: pt using BUE support to push into standing    Ambulation/Gait Ambulation/Gait assistance: Supervision Gait Distance (Feet): 150 Feet Assistive device: None Gait Pattern/deviations: WFL(Within Functional Limits), Wide base of support Gait velocity: WFL, but frequent standing breaks due to O2 desat     General Gait Details: no LOB noted, mod cues for standing rest breaks when SpO2 desat <88% and pursed-lip breathing vs diaphragmatic breathing. Pt on 4L initially but instantly desat upon standing, increased to 6L O2 [place] with desat as low as 82% with short distance gait into hallway. With standing break, SpO2 improves to 86%, but needed prolonged seated break on 6L to improve SpO2 >88%. RN notified, pt will need high flow nasal cannula and likely at least 8L/min O2 for home for exertional tasks. Pt states he has finger pulse oximiter and will use it at home with exertion so he can properly titrate his O2. He states he had DOE "for a  while" prior to hospital admission.   Stairs             Wheelchair Mobility    Modified Rankin (Stroke Patients Only)       Balance Overall balance assessment: No apparent balance deficits (not formally assessed)                                          Cognition Arousal/Alertness: Awake/alert Behavior  During Therapy: WFL for tasks assessed/performed Overall Cognitive Status: Within Functional Limits for tasks assessed                                 General Comments: likely close to baseline, pt with poor medical literacy and understanding of safety. A and O x4. Handouts to emphasize safer exercises for him (seated/standing), use of IS and breathing exercises.        Exercises Other Exercises Other Exercises: reviewed reciprocal STS, seated LE exercises: AP, LAQ, hip flexion (with pt teachback), standing hip flexion/mini squats and supine SLR, handout to reinforce. Pt pulled ~2250 on IS. Other Exercises: pt defers stair trial    General Comments General comments (skin integrity, edema, etc.): Dense instruction on activity pacing, energy conservation, safety with HEP, use of IS, and self-monitoring of vitals to ensure adequate O2 saturations once home. Pt/girlfriend receptive. Pt states "I think I hurt my back trying to exercise standing in my room here", PTA encouraged him to mention this to primary care for follow-up and use of ice PRN for acute injury, avoid heat for first 3-5 days for acute back pain.      Pertinent Vitals/Pain Pain Assessment Pain Assessment: Faces Faces Pain Scale: Hurts even more Pain Location: back (chronic 8/10) and hips/LEs 5/10 (dull) Pain Descriptors / Indicators: Dull, Aching Pain Intervention(s): Monitored during session, Limited activity within patient's tolerance, Repositioned, Patient requesting pain meds-RN notified    Home Living                          Prior Function            PT Goals (current goals can now be found in the care plan section) Acute Rehab PT Goals Patient Stated Goal: return to independence, improve endurance PT Goal Formulation: With patient Time For Goal Achievement: 07/01/22 Progress towards PT goals: Progressing toward goals    Frequency    Min 1X/week      PT Plan Current plan remains  appropriate       AM-PAC PT "6 Clicks" Mobility   Outcome Measure  Help needed turning from your back to your side while in a flat bed without using bedrails?: None Help needed moving from lying on your back to sitting on the side of a flat bed without using bedrails?: None Help needed moving to and from a bed to a chair (including a wheelchair)?: A Little Help needed standing up from a chair using your arms (e.g., wheelchair or bedside chair)?: None Help needed to walk in hospital room?: A Little Help needed climbing 3-5 steps with a railing? : A Little 6 Click Score: 21    End of Session Equipment Utilized During Treatment: Gait belt;Oxygen Activity Tolerance: Patient tolerated treatment well;Patient limited by pain;Other (comment) (c/o upper and lower back pain and DOE 2/4) Patient left:  in chair;with call bell/phone within reach;with family/visitor present;Other (comment) (sitting EOB awaiting DC) Nurse Communication: Mobility status;Other (comment) (pt needs HF O2 [place] tubing for home O2 as he needs >6L/min with exertional tasks. Portable O2 tanks already in the room.) PT Visit Diagnosis: Other abnormalities of gait and mobility (R26.89)     Time: 0981-1914 PT Time Calculation (min) (ACUTE ONLY): 19 min  Charges:  $Therapeutic Activity: 8-22 mins                     Bracha Frankowski P., PTA Acute Rehabilitation Services Secure Chat Preferred 9a-5:30pm Office: [phone]    Dorathy Kinsman [facility] 06/23/2022, 7:17 PM

## 2022-06-23 NOTE — Progress Notes (Signed)
SATURATION QUALIFICATIONS: (This note is used to comply with regulatory documentation for home oxygen)  Patient Saturations on Room Air at Rest = N/A  (pt SpO2 87% on 5L O2 [place] at rest in supine, improved with upright posture at EOB and pursed-lip breathing to >90%)  Patient Saturations on Room Air while Ambulating = N/A  Patient Saturations on 6 Liters of oxygen while Ambulating = 82%. Improved to 86% with standing break, however did not improve >88% until pt took seated break and cued for pursed-lip breathing strategy.  Please briefly explain why patient needs home oxygen: Anticipate pt will need HFNC tubing for home as well as at least 8L/min O2 equipment as he is desaturating with minimal exertion on 6L/min, case mgmt/RN notified.

## 2022-06-23 NOTE — Discharge Summary (Addendum)
Physician Discharge Summary   Patient: [name] [name] MRN: [id number] DOB: 10/07/1960  Admit date:     06/16/2022  Discharge date: 06/23/22  Discharge Physician: York Ram Isiac Breighner   PCP: Kaleen Mask, MD   Recommendations at discharge:    Patient has been placed on SGLT 2 inh and spironolactone.  Changed furosemide to torsemide.  To consider adding ARB as outpatient if blood pressure stable.  Follow up with Dr Jeannetta Nap in 7 to 10 days. Follow up with Cardiology, EP and Pulmonary as outpatient.  Added as needed alprazolam for anxiety.   Discharge Diagnoses: Principal Problem:   Acute on chronic diastolic CHF (congestive heart failure) (HCC) Active Problems:   Persistent atrial fibrillation (HCC)   PE (pulmonary thromboembolism) (HCC)   COPD (chronic obstructive pulmonary disease) (HCC)   CKD (chronic kidney disease), stage II   Generalized anxiety disorder   Low back pain   OSA (obstructive sleep apnea)  Resolved Problems:   Chronic back pain  Hospital Course: [name] [name] was admitted to the hospital with the working diagnosis of heart failure exacerbation.   62/M with history of COPD, chronic respiratory failure on 2 L home O2, paroxysmal A-fib on Eliquis, sleep apnea, obesity, CKD 2, presented to the ED with progressive shortness of breath and edema X 2 to 3 weeks, prescribed Lasix by his PCP few weeks ago. In the ED hypoxic, 81% on room air, placed on 6 L high flow nasal cannula, blood pressure 117/76, HR 80, RR 20. Lungs with no rales or wheezing, heart with S1 and S2 present and rhythmic, abdomen with no distention and positive lower extremity edema.   Na 136, K 4,3 Cl 93 bicarbonate 33, glucose 93 bun 24 cr 1,18. Mag 2,2  BNP 155  Wbc 10,8 hgb 15,8 plt 265  TSH 6,189 free T4 0,79  Sars covid 19 negative   Chest radiograph with cardiomegaly, bilateral hilar vascular congestion, with cephalization of the vasculature, bilateral interstitial infiltrates,  predominantly at the lower lobes.   EKG 82 bpm, left axis deviation, normal intervals, sinus rhythm, with left atrial enlargement with no significant ST segment or T wave changes.   CTA chest with chronic residual fibrinous nonocclusive thrombus, new central groundglass opacities in anterior upper lobe, inferior upper lobe concerning for multifocal infectious/inflammatory process  Apical paraseptal emphysematous changes.   -Improving on diuretics slowly 05/16 patient is responding well to diuresis.,   Assessment and Plan: * Acute on chronic diastolic CHF (congestive heart failure) (HCC) Echocardiogram with preserved LV systolic function, EF 60 to 65%, RV with mild enlargement, with preserved systolic function, trivial pericardial effusion.   Patient was placed on IV furosemide for diuresis, negative fluid balance was achieved, -11,889 ml, with significant improvement in his symptoms.   Continue medical therapy with metoprolol, SGLT 2 inh and spironolactone.  To consider starting ARB as outpatient.   Acute on chronic hypoxemic respiratory failure, due to cardiogenic pulmonary edema,  Pneumonia has been ruled out. 02 saturation is 92 on 5 L/min per [place].   Persistent atrial fibrillation (HCC) Continue rate and rhythm control with dofetilide, metoprolol succinate.  Anticoagulation with apixaban.   PE (pulmonary thromboembolism) (HCC) No acute PE on CTA Continue eliquis BID   COPD (chronic obstructive pulmonary disease) (HCC) No signs of exacerbation on exam. Continue bronchodilator therapy, and inhaled corticosteroids.  Supplemental 02 per [place] at least 12 hrs per day.   CKD (chronic kidney disease), stage II Hyponatremia.   At the time of his  discharge his renal function has a serum cr of 1,22 with K at 3,9 and serum bicarbonate at 34. Na is 134.  Plan to continue diuresis with torsemide, spironolactone and empagliflozin. .  Follow up renal function as outpatient.,  Generalized  anxiety disorder Follow up as outpatient.   Low back pain DDD with mild disc bulge at L3-L4 and L4-L5 with mild lateral recess stenosis bilaterally at L3-L4 and L4-L5. No significant neural foraminal stenosis.   Patient has been placed on gabapentin with good toleration. Will recommended home health services to continue physical therapy.   OSA (obstructive sleep apnea) Continue Cpap at home.   Obesity class 3, with calculated BMI of 48,2   Chronic back pain-resolved as of 06/17/2022 Chronic issue Exam with no red flags CT lumbar spine with no acute findings, DDD Start lidocaine patch  Tramadol prn  Never has tried gabapentin, trial this here.  PT eval          Consultants: none  Procedures performed: none   Disposition: Home Diet recommendation:  Cardiac diet DISCHARGE MEDICATION: Allergies as of 06/23/2022   No Known Allergies      Medication List     STOP taking these medications    furosemide 40 MG tablet Commonly known as: LASIX       TAKE these medications    ALPRAZolam 0.25 MG tablet Commonly known as: XANAX Take 1 tablet (0.25 mg total) by mouth 3 (three) times daily as needed for anxiety.   Breztri Aerosphere 160-9-4.8 MCG/ACT Aero Generic drug: Budeson-Glycopyrrol-Formoterol Inhale 2 puffs into the lungs in the morning and at bedtime.   dofetilide 500 MCG capsule Commonly known as: TIKOSYN TAKE 1 CAPSULE BY MOUTH 2 TIMES DAILY. What changed:  how much to take how to take this when to take this additional instructions   Eliquis 5 MG Tabs tablet Generic drug: apixaban TAKE 1 TABLET BY MOUTH 2 TIMES DAILY.   empagliflozin 10 MG Tabs tablet Commonly known as: JARDIANCE Take 1 tablet (10 mg total) by mouth daily.   gabapentin 100 MG capsule Commonly known as: NEURONTIN Take 1 capsule (100 mg total) by mouth 3 (three) times daily as needed (back pain).   metoprolol succinate 100 MG 24 hr tablet Commonly known as: TOPROL-XL TAKE 1  TABLET (100 MG TOTAL) BY MOUTH DAILY. What changed:  how much to take how to take this when to take this additional instructions   montelukast 10 MG tablet Commonly known as: SINGULAIR Take 10 mg by mouth at bedtime.   pantoprazole 40 MG tablet Commonly known as: PROTONIX Take 1 tablet (40 mg total) by mouth daily. What changed:  when to take this reasons to take this   albuterol (2.5 MG/3ML) 0.083% nebulizer solution Commonly known as: PROVENTIL Take 2.5 mg by nebulization as needed for wheezing or shortness of breath. What changed: Another medication with the same name was changed. Make sure you understand how and when to take each.   ProAir HFA 108 (90 Base) MCG/ACT inhaler Generic drug: albuterol TAKE 1 TO 2 PUFFS EVERY 4 TO 6 HOURS AS NEEDED FOR SHORTNESS OF BREATH. What changed: See the new instructions.   spironolactone 25 MG tablet Commonly known as: ALDACTONE Take 0.5 tablets (12.5 mg total) by mouth daily.   torsemide 20 MG tablet Commonly known as: DEMADEX Take 2 tablets (40 mg total) by mouth daily.               Durable Medical Equipment  (From admission,  onward)           Start     Ordered   06/23/22 0950  For home use only DME Walker  Once       Comments: Rolling walker 4 wheel and seat  Question:  Patient needs a walker to treat with the following condition  Answer:  Weakness   06/23/22 0950   06/23/22 0949  For home use only DME Shower stool  Once        06/23/22 0950   06/23/22 0934  For home use only DME oxygen  Once       Question Answer Comment  Length of Need 12 Months   Mode or (Route) Nasal cannula   Liters per Minute 5   Frequency Continuous (stationary and portable oxygen unit needed)   Oxygen conserving device Yes   Oxygen delivery system Gas      06/23/22 0933            Follow-up Information     Kaleen Mask, MD Follow up.   Specialty: Family Medicine Contact information: [address] [place] [address] [phone]         [facility] .   Specialty: Cardiology Contact information: [address] [id number] [place]ito [facility] [place] [address] [phone]        [facility]. Go in 18 day(s).   Specialty: Cardiology Why: Hospital follow up6/05/2022 @ 2 pm PLEASE bring a current medication list to appointment FREE valet parking, Entrance C, off [employer] information: [address] [phone] mc [place] [address] [phone]               Discharge Exam: Filed Weights   06/21/22 0432 06/22/22 0449 06/23/22 0506  Weight: 135.4 kg 134.9 kg 134.1 kg   BP 126/67 (BP Location: Left Arm)   Pulse 79   Temp 97.7 F (36.5 C) (Oral)   Resp 18   Ht 5\' 6"  (1.676 m)   Wt 134.1 kg   SpO2 91%   BMI 47.72 kg/m   Patient with improved dyspnea, no chest pain, this am with mild anxiety and dyspepsia.   Neurology awake and alert ENT with no pallor Cardiovascular with S1 and S2 present and rhythmic with no gallops, rubs or murmurs No JVD No lower extremity edema, ted hose in place Respiratory with no rales or wheezing, no rhonchi Abdomen with mild distention, with no rebound or guarding.   Condition at discharge: stable  The results of significant diagnostics from this hospitalization (including imaging, microbiology, ancillary and laboratory) are listed below for reference.   Imaging Studies: ECHOCARDIOGRAM COMPLETE  Result Date: 06/18/2022    ECHOCARDIOGRAM REPORT   Patient Name:   KOH AMOR Date of Exam: 06/18/2022 Medical Rec #:  [id number]         Height:       66.0 in Accession #:    [id number]        Weight:       307.8 lb Date of Birth:  09/06/1960          BSA:          2.402 m Patient Age:    62 years          BP:           125/65 mmHg Patient Gender: M  HR:           98 bpm. Exam Location:   Inpatient Procedure: 2D Echo, Cardiac Doppler, Color Doppler and Intracardiac            Opacification Agent Indications:    CHF-Acute Diastolic I50.31  History:        Patient has prior history of Echocardiogram examinations, most                 recent 03/08/2022. CHF, CKD 2, Arrythmias:Atrial Fibrillation;                 Risk Factors:Sleep Apnea, Hypertension and Former Smoker.  Sonographer:    Dondra Prader RVT RCS Referring Phys: [id number] ALLISON WOLFE IMPRESSIONS  1. Left ventricular ejection fraction, by estimation, is 60 to 65%. The left ventricle has normal function. The left ventricle has no regional wall motion abnormalities. There is mild concentric left ventricular hypertrophy. Left ventricular diastolic parameters were normal.  2. Right ventricular systolic function is low normal. The right ventricular size is mildly enlarged. There is normal pulmonary artery systolic pressure.  3. Right atrial size was mildly dilated.  4. The mitral valve is normal in structure. No evidence of mitral valve regurgitation.  5. The aortic valve is tricuspid. Aortic valve regurgitation is not visualized.  6. The inferior vena cava is normal in size with greater than 50% respiratory variability, suggesting right atrial pressure of 3 mmHg. Comparison(s): No significant change from prior study. FINDINGS  Left Ventricle: Left ventricular ejection fraction, by estimation, is 60 to 65%. The left ventricle has normal function. The left ventricle has no regional wall motion abnormalities. Definity contrast agent was given IV to delineate the left ventricular  endocardial borders. The left ventricular internal cavity size was normal in size. There is mild concentric left ventricular hypertrophy. Left ventricular diastolic parameters were normal. Right Ventricle: The right ventricular size is mildly enlarged. Right ventricular systolic function is low normal. There is normal pulmonary artery systolic pressure. The tricuspid  regurgitant velocity is 1.73 m/s, and with an assumed right atrial pressure of 3 mmHg, the estimated right ventricular systolic pressure is 15.0 mmHg. Left Atrium: Left atrial size was normal in size. Right Atrium: Right atrial size was mildly dilated. Pericardium: Trivial pericardial effusion is present. Mitral Valve: The mitral valve is normal in structure. No evidence of mitral valve regurgitation. Tricuspid Valve: Tricuspid valve regurgitation is trivial. Aortic Valve: The aortic valve is tricuspid. Aortic valve regurgitation is not visualized. Aortic valve mean gradient measures 2.0 mmHg. Aortic valve peak gradient measures 3.0 mmHg. Aortic valve area, by VTI measures 3.73 cm. Pulmonic Valve: Pulmonic valve regurgitation is not visualized. Aorta: The aortic root and ascending aorta are structurally normal, with no evidence of dilitation. Venous: The inferior vena cava is normal in size with greater than 50% respiratory variability, suggesting right atrial pressure of 3 mmHg. IAS/Shunts: No atrial level shunt detected by color flow Doppler.  LEFT VENTRICLE PLAX 2D LVIDd:         4.60 cm   Diastology LVIDs:         2.80 cm   LV e' medial:    5.11 cm/s LV PW:         1.30 cm   LV E/e' medial:  15.8 LV IVS:        1.40 cm   LV e' lateral:   12.60 cm/s LVOT diam:     2.10 cm   LV E/e' lateral: 6.4 LV SV:  67 LV SV Index:   28 LVOT Area:     3.46 cm  RIGHT VENTRICLE             IVC RV Basal diam:  4.90 cm     IVC diam: 2.00 cm RV Mid diam:    4.70 cm RV S prime:     10.30 cm/s TAPSE (M-mode): 1.8 cm LEFT ATRIUM             Index        RIGHT ATRIUM           Index LA diam:        4.50 cm 1.87 cm/m   RA Area:     32.50 cm LA Vol (A2C):   53.0 ml 22.07 ml/m  RA Volume:   124.00 ml 51.63 ml/m LA Vol (A4C):   49.9 ml 20.78 ml/m LA Biplane Vol: 62.6 ml 26.06 ml/m  AORTIC VALVE                    PULMONIC VALVE AV Area (Vmax):    3.52 cm     PV Vmax:       0.72 m/s AV Area (Vmean):   3.43 cm     PV Peak  grad:  2.1 mmHg AV Area (VTI):     3.73 cm AV Vmax:           87.30 cm/s AV Vmean:          57.600 cm/s AV VTI:            0.180 m AV Peak Grad:      3.0 mmHg AV Mean Grad:      2.0 mmHg LVOT Vmax:         88.60 cm/s LVOT Vmean:        57.100 cm/s LVOT VTI:          0.194 m LVOT/AV VTI ratio: 1.08  AORTA Ao Root diam: 3.50 cm Ao Asc diam:  3.30 cm Ao Arch diam: 2.8 cm MITRAL VALVE               TRICUSPID VALVE MV Area (PHT): 3.31 cm    TR Peak grad:   12.0 mmHg MV Decel Time: 229 msec    TR Vmax:        173.00 cm/s MV E velocity: 80.70 cm/s MV A velocity: 69.20 cm/s  SHUNTS MV E/A ratio:  1.17        Systemic VTI:  0.19 m                            Systemic Diam: 2.10 cm Carolan Clines Electronically signed by Carolan Clines Signature Date/Time: 06/18/2022/12:14:54 PM    Final    CT Lumbar Spine Wo Contrast  Result Date: 06/16/2022 CLINICAL DATA:  Low back pain.  Spondyloarthropathy. EXAM: CT LUMBAR SPINE WITHOUT CONTRAST TECHNIQUE: Multidetector CT imaging of the lumbar spine was performed without intravenous contrast administration. Multiplanar CT image reconstructions were also generated. RADIATION DOSE REDUCTION: This exam was performed according to the departmental dose-optimization program which includes automated exposure control, adjustment of the mA and/or kV according to patient size and/or use of iterative reconstruction technique. COMPARISON:  None Available. FINDINGS: Segmentation: 5 lumbar type vertebrae. Alignment: Normal. Vertebrae: No acute fracture or aggressive osseous lesion. Paraspinal and other soft tissues: Negative. Other: None Disc levels: T12-L1: Disc height loss without significant disc bulge, spinal canal or  neural foraminal stenosis. L1-L2: No significant disc bulge, spinal canal or neural foraminal stenosis. L2-L3: No significant disc bulge, spinal canal or neural foraminal stenosis. L3-L4: Disc height loss with mild disc bulge and mild bilateral facet joint arthropathy. Mild lateral  recess stenosis bilaterally. No significant neural foraminal stenosis. L4-L5: Broad-based disc bulge with mild lateral recess stenosis bilaterally. Mild bilateral facet joint arthropathy. No significant neural foraminal stenosis. L5-S1: Mild circumferential disc bulge without significant spinal canal or neural foraminal stenosis. Mild bilateral facet joint arthropathy. IMPRESSION: 1. Multilevel degenerative disc disease with mild disc bulge at L3-L4 and L4-L5 with mild lateral recess stenosis bilaterally at L3-L4 and L4-L5. No significant neural foraminal stenosis. 2. Mild bilateral facet joint arthropathy at L3-L4, L4-L5 and L5-S1. 3. No acute fracture or aggressive osseous lesion. Electronically Signed   By: Larose Hires D.O.   On: 06/16/2022 18:09   CT Angio Chest PE W and/or Wo Contrast  Result Date: 06/16/2022 CLINICAL DATA:  Respiratory distress. Clinical concern for pulmonary embolus. Positive D-dimer. EXAM: CT ANGIOGRAPHY CHEST WITH CONTRAST TECHNIQUE: Multidetector CT imaging of the chest was performed using the standard protocol during bolus administration of intravenous contrast. Multiplanar CT image reconstructions and MIPs were obtained to evaluate the vascular anatomy. RADIATION DOSE REDUCTION: This exam was performed according to the departmental dose-optimization program which includes automated exposure control, adjustment of the mA and/or kV according to patient size and/or use of iterative reconstruction technique. CONTRAST:  OMNIPAQUE IOHEXOL 350 MG/ML SOLN COMPARISON:  06/25/2021 FINDINGS: Cardiovascular: The heart size is normal. No substantial pericardial effusion. Mild atherosclerotic calcification is noted in the wall of the thoracic aorta. Linear filling defect identified in segmental/subsegmental branches to the right lower lobe (image 196/5), in the region of the acute pulmonary embolus seen on the study from 1 year ago. 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 Similar linear filling defect identified in a segmental left upper lobe branch on image 134/5 in a region of acute embolus seen on the previous exam. No evidence for acute pulmonary embolus on today's study. Mediastinum/Nodes: No mediastinal lymphadenopathy. There is no hilar lymphadenopathy. The esophagus has normal imaging features. There is no axillary lymphadenopathy. Lungs/Pleura: Centrilobular and paraseptal emphysema evident. There is new central ground-glass airspace disease in the anterior right upper lobe (38/6) with patchy and nodular ground-glass and consolidative airspace disease in the inferior right upper lobe. Right middle lobe collapse is similar to prior. Mild bronchiectasis with bronchial wall thickening and peripheral airway impaction in the dependent lower lobes is similar to prior. No substantial pleural effusion. Upper Abdomen: Visualized portion of the upper abdomen is unremarkable. Musculoskeletal: No worrisome lytic or sclerotic osseous abnormality. Review of the MIP images confirms the above findings. IMPRESSION: 1. No CT evidence for acute pulmonary embolus. 2. Linear filling defects in segmental/subsegmental branches to the right lower lobe and left upper lobe in the areas of acute pulmonary embolus seen on the study from 1 year ago. Imaging features today are compatible with chronic fibrinous nonocclusive residual embolus. 3. New central ground-glass airspace disease in the anterior right upper lobe with patchy and nodular ground-glass and consolidative airspace disease in the inferior right upper lobe. Imaging features are compatible with multifocal infectious/inflammatory etiology. 4. Right middle lobe collapse is similar to prior. 5. Mild bronchiectasis with bronchial wall thickening and peripheral airway impaction in the dependent lower lobes is similar to prior. 6. Aortic Atherosclerosis (ICD10-I70.0) and Emphysema  (ICD10-J43.9). Electronically Signed  By: Kennith Center M.D.   On: 06/16/2022 18:00   DG Chest Portable 1 View  Result Date: 06/16/2022 CLINICAL DATA:  Shortness of breath EXAM: PORTABLE CHEST 1 VIEW COMPARISON:  X-ray 06/25/2021 and older.  CT angiogram 06/25/2021. FINDINGS: Under penetrated radiograph with overlapping cardiac leads. Hyperinflation. Stable cardiopericardial silhouette. Mild left basilar atelectasis. The right inferior costophrenic angle is clipped off the edge of the film. No pneumothorax or effusion. No edema. IMPRESSION: Under penetrated radiograph. Hyperinflation. Stable cardiopericardial silhouette. Left-sided basilar atelectasis or scar Electronically Signed   By: Karen Kays M.D.   On: 06/16/2022 15:11    Microbiology: Results for orders placed or performed during the hospital encounter of 06/16/22  SARS Coronavirus 2 by RT PCR (hospital order, performed in [facility] hospital lab) *cepheid single result test* Anterior Nasal Swab     Status: None   Collection Time: 06/16/22  3:05 PM   Specimen: Anterior Nasal Swab  Result Value Ref Range Status   SARS Coronavirus 2 by RT PCR NEGATIVE NEGATIVE Final    Comment: (NOTE) SARS-CoV-2 target nucleic acids are NOT DETECTED.  The SARS-CoV-2 RNA is generally detectable in upper and lower respiratory specimens during the acute phase of infection. The lowest concentration of SARS-CoV-2 viral copies this assay can detect is 250 copies / mL. A negative result does not preclude SARS-CoV-2 infection and should not be used as the sole basis for treatment or other patient management decisions.  A negative result may occur with improper specimen collection / handling, submission of specimen other than nasopharyngeal swab, presence of viral mutation(s) within the areas targeted by this assay, and inadequate number of viral copies (<250 copies / mL). A negative result must be combined with clinical observations, patient history, and  epidemiological information.  Fact Sheet for Patients:   RoadLapTop.co.za  Fact Sheet for Healthcare Providers: http://kim-miller.com/  This test is not yet approved or  cleared by the [place] FDA and has been authorized for detection and/or diagnosis of SARS-CoV-2 by FDA under an Emergency Use Authorization (EUA).  This EUA will remain in effect (meaning this test can be used) for the duration of the COVID-19 declaration under Section 564(b)(1) of the Act, 21 U.S.C. section 360bbb-3(b)(1), unless the authorization is terminated or revoked sooner.  Performed at [facility], [address] [place], [place] [address]     Labs: CBC: Recent Labs  Lab 06/16/22 1505 06/18/22 0042 06/19/22 0103  WBC 10.8* 7.4 8.8  NEUTROABS 9.1*  --   --   HGB 15.8 13.8 14.3  HCT 48.2 43.9 44.4  MCV 93.2 96.1 94.1  PLT 265 210 213   Basic Metabolic Panel: Recent Labs  Lab 06/17/22 1046 06/18/22 0042 06/19/22 0103 06/20/22 0101 06/21/22 0115 06/22/22 0104 06/23/22 0059  NA  --    < > 134* 133* 133* 132* 134*  K  --    < > 3.9 3.8 4.0 3.5 3.9  CL  --    < > 89* 88* 91* 90* 92*  CO2  --    < > 35* 35* 33* 32 34*  GLUCOSE  --    < > 135* 128* 120* 137* 121*  BUN  --    < > 23* 22* 26* 27* 24*  CREATININE  --    < > 1.13 1.32* 1.21 1.18 1.22  CALCIUM  --    < > 8.7* 8.8* 8.9 8.7* 8.7*  MG 2.2  --   --  2.1 2.4  --   --    < > = values in this interval not displayed.   Liver Function Tests: Recent Labs  Lab 06/16/22 1505  AST 32  ALT 23  ALKPHOS 56  BILITOT 0.9  PROT 7.5  ALBUMIN 3.7   CBG: No results for input(s): "GLUCAP" in the last 168 hours.  Discharge time spent: greater than 30 minutes.  Signed: Coralie Keens, MD [facility] 06/23/2022

## 2022-06-23 NOTE — Progress Notes (Signed)
Discharge instructions reviewed with patient. IV and telemetry removed. DME and oxygen delivered. Awaiting TOC meds.

## 2022-06-23 NOTE — TOC Transition Note (Addendum)
Transition of Care [facility]) - CM/SW Discharge Note   Patient Details  Name: [name] [name] MRN: [id number] Date of Birth: 1960/09/17  Transition of Care [facility]) CM/SW Contact:  Lockie Pares, RN Phone Number: 06/23/2022, 9:51 AM   Clinical Narrative:    Spoke to team and patient. He will need repeat oxygen qualifications. He stated that he has his oxygen from [employer]. He has some trouble walking any distances. Discussed DME to assist him. Ordered shower chair and rolling walker with seat for patient from [employer]. He states he has transport home. Discussed Home health he would prefer that over OP therapies as he has transportation deficits.  [employer]- Do not take medicaid [facility]-  May take for PT OT will call back Cannot accept [facility]- accepted Earlier consult noted for medication assistance. Patient has insurance, therefore is ineligible for [facility] medication assistance.  Patient will DC when all DME Is delivered     Barriers to Discharge: No Home Care Agency will accept this patient   Patient Goals and CMS Choice   Choice offered to / list presented to : Patient  Discharge Placement                         Discharge Plan and Services Additional resources added to the After Visit Summary for                  DME Arranged: Shower stool, Walker rolling with seat DME Agency: [employer] Date DME Agency Contacted: 06/23/22 Time DME Agency Contacted: [phone] Representative spoke with at DME Agency: Ledell Noss            Social Determinants of Health (SDOH) Interventions SDOH Screenings   Food Insecurity: No Food Insecurity (06/17/2022)  Housing: Low Risk  (06/20/2022)  Transportation Needs: No Transportation Needs (06/17/2022)  Utilities: Not At Risk (06/17/2022)  Alcohol Screen: Low Risk  (06/20/2022)  Financial Resource Strain: Low Risk  (06/20/2022)  Tobacco Use: Medium Risk (06/20/2022)     Readmission Risk Interventions     No data to display

## 2022-06-23 NOTE — TOC Transition Note (Signed)
Discharge medications filled and ready in [facility] pharmacy. Please contact TOC pharmacy before discharge or have patient pick up on way out of hospital.

## 2022-06-23 NOTE — Progress Notes (Signed)
Nurse requested Mobility Specialist to perform oxygen saturation test with pt which includes removing pt from oxygen both at rest and while ambulating.  Below are the results from that testing.     Patient Saturations on Room Air at Rest = spO2 90%  Patient Saturations on Room Air while Ambulating = sp02 82% .   Patient Saturations on 8 Liters of oxygen while Ambulating = sp02 90%  At end of testing pt left in room on 5  Liters of oxygen.  Reported results to nurse.  Addison Lank Mobility Specialist Please contact via SecureChat or  Rehab office at [phone]

## 2022-06-27 LAB — LAB REPORT - SCANNED: EGFR: 57

## 2022-07-05 ENCOUNTER — Encounter: Payer: Self-pay | Admitting: Cardiology

## 2022-07-05 ENCOUNTER — Ambulatory Visit: Payer: Medicaid Other | Attending: Cardiology | Admitting: Cardiology

## 2022-07-05 VITALS — BP 98/56 | HR 28 | Ht 66.0 in | Wt 298.2 lb

## 2022-07-05 DIAGNOSIS — I4811 Longstanding persistent atrial fibrillation: Secondary | ICD-10-CM

## 2022-07-05 DIAGNOSIS — I5032 Chronic diastolic (congestive) heart failure: Secondary | ICD-10-CM

## 2022-07-05 DIAGNOSIS — D6869 Other thrombophilia: Secondary | ICD-10-CM | POA: Diagnosis not present

## 2022-07-05 NOTE — Progress Notes (Signed)
Electrophysiology Office Note   Date:  07/05/2022   ID:  [name] [name], DOB [birth year], MRN [id number]  PCP:  Kaleen Mask, MD  Cardiologist:  Donnie Aho Primary Electrophysiologist:  Dr Elberta Fortis    CC: Follow up for atrial fibrilaltion   History of Present Illness: [name] [name] is a 62 y.o. male who is being seen today for the evaluation of atrial fibrillation at the request of [name]. Presenting today for electrophysiology evaluation.    He has a history significant for COPD, obesity, atrial fibrillation.  He had a cardioversion in 2018 but was unable to convert to sinus rhythm.  He has been loaded on dofetilide.  He wore a cardiac monitor that showed a 54% atrial fibrillation burden.  He recently had a hospitalization for diastolic heart failure.  He was aggressively diuresed and discharged from the hospital.  He was intermittently in and out of atrial fibrillation during his hospitalization.  Today, denies symptoms of palpitations, chest pain, orthopnea, PND, lower extremity edema, claudication, dizziness, presyncope, syncope, bleeding, or neurologic sequela. The patient is tolerating medications without difficulties.  His main complaints today are weakness, fatigue, shortness of breath.  He does have days that he feels better and he attributes this to being in sinus rhythm.   Past Medical History:  Diagnosis Date   Chronic diastolic CHF (congestive heart failure) (HCC)    COPD (chronic obstructive pulmonary disease) (HCC)    Hypertension    Obesity (BMI 30-39.9) 12/15/2016   OSA (obstructive sleep apnea)    Persistent atrial fibrillation (HCC) 12/15/2016   Visit for monitoring Tikosyn therapy    Past Surgical History:  Procedure Laterality Date   CARDIAC CATHETERIZATION  02/22/2018   CARDIOVERSION N/A 12/22/2016   Procedure: CARDIOVERSION;  Surgeon: Othella Boyer, MD;  Location: [facility] ENDOSCOPY;  Service: Cardiovascular;  Laterality: N/A;    HEMORROIDECTOMY     HERNIA REPAIR     RIGHT/LEFT HEART CATH AND CORONARY ANGIOGRAPHY N/A 02/22/2018   Procedure: RIGHT/LEFT HEART CATH AND CORONARY ANGIOGRAPHY;  Surgeon: Yvonne Kendall, MD;  Location: [facility] INVASIVE CV LAB;  Service: Cardiovascular;  Laterality: N/A;     Current Outpatient Medications  Medication Sig Dispense Refill   albuterol (PROAIR HFA) 108 (90 Base) MCG/ACT inhaler TAKE 1 TO 2 PUFFS EVERY 4 TO 6 HOURS AS NEEDED FOR SHORTNESS OF BREATH. (Patient taking differently: Inhale 2 puffs into the lungs every 6 (six) hours as needed for wheezing or shortness of breath.) 8.5 g 2   albuterol (PROVENTIL) (2.5 MG/3ML) 0.083% nebulizer solution Take 2.5 mg by nebulization as needed for wheezing or shortness of breath.     ALPRAZolam (XANAX) 0.25 MG tablet Take 1 tablet (0.25 mg total) by mouth 3 (three) times daily as needed for anxiety. 10 tablet 0   apixaban (ELIQUIS) 5 MG TABS tablet TAKE 1 TABLET BY MOUTH 2 TIMES DAILY. 180 tablet 1   Budeson-Glycopyrrol-Formoterol (BREZTRI AEROSPHERE) 160-9-4.8 MCG/ACT AERO Inhale 2 puffs into the lungs in the morning and at bedtime. 10.7 g 5   dofetilide (TIKOSYN) 500 MCG capsule TAKE 1 CAPSULE BY MOUTH 2 TIMES DAILY. (Patient taking differently: Take 500 mcg by mouth 2 (two) times daily.) 180 capsule 3   empagliflozin (JARDIANCE) 10 MG TABS tablet Take 1 tablet (10 mg total) by mouth daily. 30 tablet 0   gabapentin (NEURONTIN) 100 MG capsule Take 1 capsule (100 mg total) by mouth 3 (three) times daily as needed (back pain). 90 capsule 0   metoprolol succinate (  TOPROL-XL) 100 MG 24 hr tablet TAKE 1 TABLET (100 MG TOTAL) BY MOUTH DAILY. (Patient taking differently: Take 50 mg by mouth in the morning and at bedtime.) 90 tablet 3   montelukast (SINGULAIR) 10 MG tablet Take 10 mg by mouth at bedtime.     spironolactone (ALDACTONE) 25 MG tablet Take 0.5 tablets (12.5 mg total) by mouth daily. 15 tablet 0   torsemide (DEMADEX) 20 MG tablet Take 2 tablets  (40 mg total) by mouth daily. 60 tablet 0   pantoprazole (PROTONIX) 40 MG tablet Take 1 tablet (40 mg total) by mouth daily. (Patient taking differently: Take 40 mg by mouth as needed (reflux).) 30 tablet 1   No current facility-administered medications for this visit.    Allergies:   Patient has no known allergies.   Social History:  The patient  reports that he quit smoking about 15 years ago. His smoking use included cigarettes. He has a 30.00 pack-year smoking history. He has been exposed to tobacco smoke. He has never used smokeless tobacco. He reports current alcohol use of about 4.0 standard drinks of alcohol per week. He reports that he does not use drugs.   Family History:  The patient's family history includes Atrial fibrillation in his brother; CAD in his brother; COPD in his father and mother.   ROS:  Please see the history of present illness.   Otherwise, review of systems is positive for none.   All other systems are reviewed and negative.   PHYSICAL EXAM: VS:  BP (!) 98/56   Pulse (!) 28   Ht 5\' 6"  (1.676 m)   Wt 298 lb 3.2 oz (135.3 kg)   SpO2 (!) 85%   BMI 48.13 kg/m  , BMI Body mass index is 48.13 kg/m. GEN: Well nourished, well developed, in no acute distress  HEENT: normal  Neck: no JVD, carotid bruits, or masses Cardiac: irregular; no murmurs, rubs, or gallops,no edema  Respiratory:  clear to auscultation bilaterally, normal work of breathing GI: soft, nontender, nondistended, + BS MS: no deformity or atrophy  Skin: warm and dry Neuro:  Strength and sensation are intact Psych: euthymic mood, full affect  EKG:  EKG is ordered today. Personal review of the ekg ordered shows atrial fibrillation   Recent Labs: 02/10/2022: NT-Pro BNP 504 06/16/2022: ALT 23; B Natriuretic Peptide 155.6 06/17/2022: TSH 6.189 06/19/2022: Hemoglobin 14.3; Platelets 213 06/21/2022: Magnesium 2.4 06/23/2022: BUN 24; Creatinine, Ser 1.22; Potassium 3.9; Sodium 134    Lipid Panel      Component Value Date/Time   CHOL 183 02/08/2018 1319   TRIG 125 02/08/2018 1319   HDL 47 02/08/2018 1319   CHOLHDL 3.9 02/08/2018 1319   LDLCALC 111 (H) 02/08/2018 1319     Wt Readings from Last 3 Encounters:  07/05/22 298 lb 3.2 oz (135.3 kg)  06/23/22 295 lb 10.2 oz (134.1 kg)  05/03/22 (!) 301 lb 12.8 oz (136.9 kg)      Other studies Reviewed: Additional studies/ records that were reviewed today include: TTE 05/30/19  1. Left ventricular ejection fraction, by estimation, is 60 to 65%. The  left ventricle has normal function. The left ventricle has no regional  wall motion abnormalities. Left ventricular diastolic parameters were  normal.   2. Right ventricular systolic function is normal. The right ventricular  size is normal.   3. Left atrial size was mildly dilated.   4. The mitral valve is normal in structure. No evidence of mitral valve  regurgitation. No  evidence of mitral stenosis.   5. The aortic valve is normal in structure. Aortic valve regurgitation is  not visualized. No aortic stenosis is present.   6. The inferior vena cava is normal in size with greater than 50%  respiratory variability, suggesting right atrial pressure of 3 mmHg.   ASSESSMENT AND PLAN:  1.  Longstanding persistent atrial fibrillation: CHA2DS2-VASc of 3.  Currently on Eliquis and dofetilide.  He is in and out of atrial fibrillation with a 54% burden.  He feels quite poorly in atrial fibrillation.  He would prefer an alternative rhythm control strategy.  I am concerned about his pulmonary status.  He has follow-up with pulmonology.  He would need to be under general anesthesia if we were to move forward with ablation.  Would appreciate to their recommendations on feasibility of general anesthesia.  2.  Obstructive sleep apnea: CPAP compliance encouraged.  I have told him that it is vital that he is compliant with his CPAP prior to scheduling ablation.  3.  Chronic diastolic heart failure: No  obvious volume overload.  4.  COPD: Has follow-up planned with pulmonology  5.  Secondary hypercoagulable state: Currently on Eliquis for atrial fibrillation  6.  Obesity: Lifestyle modification encouraged Body mass index is 48.13 kg/m.    Current medicines are reviewed at length with the patient today.   The patient does not have concerns regarding his medicines.  The following changes were made today: Increase Lasix   Labs/ tests ordered today include:  Orders Placed This Encounter  Procedures   EKG 12-Lead      Disposition:   FU 6 months  Signed, Benito Lemmerman Jorja Loa, MD  07/05/2022 2:50 PM     [facility] [address] [place] [address] [phone] (office) [phone] (fax)

## 2022-07-11 ENCOUNTER — Ambulatory Visit (HOSPITAL_COMMUNITY)
Admit: 2022-07-11 | Discharge: 2022-07-11 | Disposition: A | Discharge status: Home / Self Care | Payer: Medicaid Other | Attending: Physician Assistant | Admitting: Physician Assistant

## 2022-07-11 ENCOUNTER — Encounter (HOSPITAL_COMMUNITY): Payer: Self-pay

## 2022-07-11 VITALS — BP 115/64 | HR 88 | Wt 302.0 lb

## 2022-07-11 DIAGNOSIS — R0602 Shortness of breath: Secondary | ICD-10-CM

## 2022-07-11 DIAGNOSIS — Z7901 Long term (current) use of anticoagulants: Secondary | ICD-10-CM | POA: Insufficient documentation

## 2022-07-11 DIAGNOSIS — I4819 Other persistent atrial fibrillation: Secondary | ICD-10-CM | POA: Diagnosis not present

## 2022-07-11 DIAGNOSIS — J4489 Other specified chronic obstructive pulmonary disease: Secondary | ICD-10-CM | POA: Diagnosis not present

## 2022-07-11 DIAGNOSIS — I480 Paroxysmal atrial fibrillation: Secondary | ICD-10-CM

## 2022-07-11 DIAGNOSIS — Z7984 Long term (current) use of oral hypoglycemic drugs: Secondary | ICD-10-CM | POA: Insufficient documentation

## 2022-07-11 DIAGNOSIS — Z9981 Dependence on supplemental oxygen: Secondary | ICD-10-CM | POA: Insufficient documentation

## 2022-07-11 DIAGNOSIS — I5032 Chronic diastolic (congestive) heart failure: Secondary | ICD-10-CM | POA: Diagnosis not present

## 2022-07-11 DIAGNOSIS — Z86711 Personal history of pulmonary embolism: Secondary | ICD-10-CM | POA: Diagnosis not present

## 2022-07-11 DIAGNOSIS — J9611 Chronic respiratory failure with hypoxia: Secondary | ICD-10-CM | POA: Diagnosis not present

## 2022-07-11 DIAGNOSIS — I2510 Atherosclerotic heart disease of native coronary artery without angina pectoris: Secondary | ICD-10-CM | POA: Insufficient documentation

## 2022-07-11 DIAGNOSIS — Z79899 Other long term (current) drug therapy: Secondary | ICD-10-CM | POA: Insufficient documentation

## 2022-07-11 DIAGNOSIS — I110 Hypertensive heart disease with heart failure: Secondary | ICD-10-CM | POA: Insufficient documentation

## 2022-07-11 DIAGNOSIS — G4733 Obstructive sleep apnea (adult) (pediatric): Secondary | ICD-10-CM

## 2022-07-11 DIAGNOSIS — Z87891 Personal history of nicotine dependence: Secondary | ICD-10-CM | POA: Insufficient documentation

## 2022-07-11 LAB — BRAIN NATRIURETIC PEPTIDE: B Natriuretic Peptide: 76.8 pg/mL (ref 0.0–100.0)

## 2022-07-11 LAB — COMPREHENSIVE METABOLIC PANEL
ALT: 28 U/L (ref 0–44)
AST: 35 U/L (ref 15–41)
Albumin: 3.4 g/dL — ABNORMAL LOW (ref 3.5–5.0)
Alkaline Phosphatase: 52 U/L (ref 38–126)
Anion gap: 11 (ref 5–15)
BUN: 17 mg/dL (ref 6–20)
CO2: 35 mmol/L — ABNORMAL HIGH (ref 22–32)
Calcium: 9 mg/dL (ref 8.9–10.3)
Chloride: 92 mmol/L — ABNORMAL LOW (ref 98–111)
Creatinine, Ser: 1.27 mg/dL — ABNORMAL HIGH (ref 0.61–1.24)
GFR, Estimated: >60 mL/min (ref >60)
Glucose, Bld: 94 mg/dL (ref 70–99)
Potassium: 4.3 mmol/L (ref 3.5–5.1)
Sodium: 138 mmol/L (ref 135–145)
Total Bilirubin: 0.9 mg/dL (ref 0.3–1.2)
Total Protein: 7.4 g/dL (ref 6.5–8.1)

## 2022-07-11 MED ORDER — TORSEMIDE 20 MG PO TABS: 40.0000 mg | ORAL_TABLET | Freq: Every day | ORAL | 0 refills | Status: DC

## 2022-07-11 MED ORDER — EMPAGLIFLOZIN 10 MG PO TABS: 10.0000 mg | ORAL_TABLET | Freq: Every day | ORAL | 1 refills | Status: DC

## 2022-07-11 MED ORDER — SPIRONOLACTONE 25 MG PO TABS: 25.0000 mg | ORAL_TABLET | Freq: Every day | ORAL | 1 refills | Status: DC

## 2022-07-11 NOTE — Progress Notes (Addendum)
HEART & VASCULAR TRANSITION OF CARE CONSULT NOTE     Referring Physician: Dr. Ella Jubilee Primary Care: Dr. Jeannetta Nap Primary Cardiologist: N/A EP: Dr. Elberta Fortis  HPI: Referred to clinic by Dr. Ella Jubilee for heart failure consultation. 62 y.o. male with history of chronic respiratory failure on O2 with exertion, severe mixed asthma/COPD, OSA on CPAP, obesity, Afib, chronic diastolic CHF, HTN and PE 05/23.   R/LHC 2020: mild CAD with 30% mid LAD, RA mean 12, PA mean 29, PCWP mean 17, LVEDP 22, Fick CO/CI 5.18/2.38  He has been followed by EP for atrial fibrillation and is on dofetilide. 53% Afib burden on 30 day monitor in February 2024.  He was admitted 06/16/22 with acute on chronic CHF. Had been started on lasix by PCP a few weeks prior with no significant improvement. Had not been compliant with CPAP, awaiting new machine. He was diuresed with IV lasix. Echo with EF 60-65%, RV low normal. Discharged home on Torsemide 40 mg daily, Jardiance 10 mg daily and metoprolol succinate 50 mg BID.   Saw EP for f/u 05/29. Has not felt well with atrial fibrillation and has wanted to proceed with ablation. However, there are concerns about his pulmonary status and would need general anesthesia prior to ablation.  He is here today for follow-up. Notes he initially felt better after discharge. However, over the last week has been short of breath. Has been on 5L O2 continuously. Gets winded easily with activity. More lower extremity edema. However, his home weight has been consistently 294-295 lb. No orthopnea or PND. Has not used CPAP since discharge. Reports a part needs to be fixed. He drinks a lot of fluids (water, coffee, gingerale). Does not use a salt shaker but eats corn dogs and other processed meats.  Previously worked in [employer] representative. Has been out of work d/t his health issues. No food or housing insecurity.   Quit smoking at 62 years of age. Consumes one alcoholic beverage daily.   Past Medical  History:  Diagnosis Date   Chronic diastolic CHF (congestive heart failure) (HCC)    COPD (chronic obstructive pulmonary disease) (HCC)    Hypertension    Obesity (BMI 30-39.9) 12/15/2016   OSA (obstructive sleep apnea)    Persistent atrial fibrillation (HCC) 12/15/2016   Visit for monitoring Tikosyn therapy     Current Outpatient Medications  Medication Sig Dispense Refill   albuterol (PROAIR HFA) 108 (90 Base) MCG/ACT inhaler TAKE 1 TO 2 PUFFS EVERY 4 TO 6 HOURS AS NEEDED FOR SHORTNESS OF BREATH. (Patient taking differently: Inhale 2 puffs into the lungs every 6 (six) hours as needed for wheezing or shortness of breath.) 8.5 g 2   albuterol (PROVENTIL) (2.5 MG/3ML) 0.083% nebulizer solution Take 2.5 mg by nebulization as needed for wheezing or shortness of breath.     ALPRAZolam (XANAX) 0.25 MG tablet Take 1 tablet (0.25 mg total) by mouth 3 (three) times daily as needed for anxiety. 10 tablet 0   apixaban (ELIQUIS) 5 MG TABS tablet TAKE 1 TABLET BY MOUTH 2 TIMES DAILY. 180 tablet 1   Budeson-Glycopyrrol-Formoterol (BREZTRI AEROSPHERE) 160-9-4.8 MCG/ACT AERO Inhale 2 puffs into the lungs in the morning and at bedtime. 10.7 g 5   dofetilide (TIKOSYN) 500 MCG capsule TAKE 1 CAPSULE BY MOUTH 2 TIMES DAILY. (Patient taking differently: Take 500 mcg by mouth 2 (two) times daily.) 180 capsule 3   metoprolol succinate (TOPROL-XL) 100 MG 24 hr tablet TAKE 1 TABLET (100 MG TOTAL) BY MOUTH DAILY. (  Patient taking differently: Take 50 mg by mouth in the morning and at bedtime.) 90 tablet 3   montelukast (SINGULAIR) 10 MG tablet Take 10 mg by mouth at bedtime.     pantoprazole (PROTONIX) 40 MG tablet Take 1 tablet (40 mg total) by mouth daily. (Patient taking differently: Take 40 mg by mouth as needed (reflux).) 30 tablet 1   empagliflozin (JARDIANCE) 10 MG TABS tablet Take 1 tablet (10 mg total) by mouth daily. 30 tablet 1   spironolactone (ALDACTONE) 25 MG tablet Take 1 tablet (25 mg total) by mouth  daily. 30 tablet 1   torsemide (DEMADEX) 20 MG tablet Take 2 tablets (40 mg total) by mouth daily. 60 tablet 0   No current facility-administered medications for this encounter.    No Known Allergies    Social History   Socioeconomic History   Marital status: Divorced    Spouse name: Not on file   Number of children: 1   Years of education: Not on file   Highest education level: Associate degree: occupational, Scientist, product/process development, or vocational program  Occupational History   Occupation: [employer]  Tobacco Use   Smoking status: Former    Packs/day: 1.00    Years: 30.00    Additional pack years: 0.00    Total pack years: 30.00    Types: Cigarettes    Quit date: 2009    Years since quitting: 15.4    Passive exposure: Past   Smokeless tobacco: Never  Vaping Use   Vaping Use: Never used  Substance and Sexual Activity   Alcohol use: Yes    Alcohol/week: 4.0 standard drinks of alcohol    Types: 4 Cans of beer per week    Comment: 3-4 days per week   Drug use: No   Sexual activity: Not on file  Other Topics Concern   Not on file  Social History Narrative   Not on file   Social Determinants of Health   Financial Resource Strain: Low Risk  (06/20/2022)   Overall Financial Resource Strain (CARDIA)    Difficulty of Paying Living Expenses: Not very hard  Food Insecurity: No Food Insecurity (06/17/2022)   Hunger Vital Sign    Worried About Running Out of Food in the Last Year: Never true    Ran Out of Food in the Last Year: Never true  Transportation Needs: No Transportation Needs (06/17/2022)   PRAPARE - Administrator, Civil Service (Medical): No    Lack of Transportation (Non-Medical): No  Physical Activity: Not on file  Stress: Not on file  Social Connections: Not on file  Intimate Partner Violence: Not At Risk (06/17/2022)   Humiliation, Afraid, Rape, and Kick questionnaire    Fear of Current or Ex-Partner: No    Emotionally Abused: No    Physically Abused: No     Sexually Abused: No      Family History  Problem Relation Age of Onset   COPD Mother    COPD Father    Atrial fibrillation Brother    CAD Brother     Vitals:   07/11/22 1402  BP: 115/64  Pulse: 88  SpO2: 91%  Weight: (!) 137 kg (302 lb)    PHYSICAL EXAM: General:  Chronically ill appearing HEENT: normal Neck: supple. JVP difficult d/t thick neck. Carotids 2+ bilat; no bruits.  Cor: PMI nondisplaced. Regular rate & rhythm. No rubs, gallops or murmurs. Lungs: diminished Abdomen: obese, soft, nontender, nondistended. Extremities: no cyanosis, clubbing, rash, 1-2+ edema  Neuro: alert & oriented x 3. Affect pleasant.  ECG: SR 86 bpm   ASSESSMENT & PLAN: HFpEF -Echo 05/23: EF 60-65%, RV mildly reduced -Echo 01/24: EF 60-65%, RV okay -Echo 06/18/22: EF 60-65%, RV low normal NYHA III.  GDMT  Diuretic-Volume overloaded on exam. Increase Torsemide to 60 mg daily X 4 days then reduce back to 40 mg daily. Suspect dyspnea is multifactorial (ie combination of CHF, pulmonary disease and deconditioning). BB-On metoprolol for afib, does not need for HFpEF Ace/ARB/ARNI-No ARNI-EF > 60% and SBP 110s MRA-Continue spiro 25 mg daily SGLT2i-Continue Jardiance 10 mg daily -Labs today -Had a long discussion about fluid and sodium intake. Provided educational handouts.  Paroxysmal atrial fibrillation -On Tikosyn. SR on ECG today. -Anticoagulated with Eliquis 5 BID  OSA -Not using CPAP. Discussed need for adherence.  Chronic respiratory failure with hypoxia Severe mixed asthma/COPD -On 5L O2 [place] -Needs to f/u with Pulmonary. Reports using inhalers/nebulizers as prescribed  Hx PE 05/23 -Anticoagulated with Eliquis  Referred to HFSW (PCP, Medications, Transportation, ETOH Abuse, Drug Abuse, Insurance, Surveyor, quantity ): No Refer to Pharmacy: No Refer to Home Health: No Refer to Advanced [facility] Clinic: No Refer to General Cardiology: No, already established  Follow up  2 weeks  to assess volume then PRN, referred to establish care with Cardiology

## 2022-07-11 NOTE — Patient Instructions (Signed)
EKG done today.  Labs done today. We will contact you only if your labs are abnormal.  CONTINUE Spironolactone 25mg  (1 tablet) by mouth daily.   INCREASE Torsemide to 60mg  (3 tablets) by mouth daily for 4 days THEN DECREASE back to 40mg  (2 tablets) by mouth daily.   Jardiance, Spironolactone and Torsemide has been refilled.   No other medication changes were made. Please continue all current medications as prescribed.  Your physician recommends that you schedule a follow-up appointment in: 2 weeks  You have been referred to General Cardiology for an appointment in about 4-6 weeks. They will contact you to schedule an appointment.   If you have any questions or concerns before your next appointment please send [place] a message through [place] or call our office at [phone].    TO LEAVE A MESSAGE FOR THE NURSE SELECT OPTION 2, PLEASE LEAVE A MESSAGE INCLUDING: YOUR NAME DATE OF BIRTH CALL BACK NUMBER REASON FOR CALL**this is important as we prioritize the call backs  YOU WILL RECEIVE A CALL BACK THE SAME DAY AS LONG AS YOU CALL BEFORE 4:00 PM   Do the following things EVERYDAY: Weigh yourself in the morning before breakfast. Write it down and keep it in a log. Take your medicines as prescribed Eat low salt foods--Limit salt (sodium) to 2000 mg per day.  Stay as active as you can everyday Limit all fluids for the day to less than 2 liters   At the [facility], you and your health needs are our priority. As part of our continuing mission to provide you with exceptional heart care, we have created designated Provider Care Teams. These Care Teams include your primary Cardiologist (physician) and Advanced Practice Providers (APPs- Physician Assistants and Nurse Practitioners) who all work together to provide you with the care you need, when you need it.   You may see any of the following providers on your designated Care Team at your next follow up: Dr Arvilla Meres Dr Marca Ancona Dr. Marcos Eke, NP Robbie Lis, [place] [facility] [place], [place] Brynda Peon, NP Karle Plumber, PharmD   Please be sure to bring in all your medications bottles to every appointment.    Thank you for choosing [facility]

## 2022-07-20 ENCOUNTER — Other Ambulatory Visit: Payer: Self-pay | Admitting: Physical Medicine & Rehabilitation

## 2022-07-20 ENCOUNTER — Ambulatory Visit
Admission: RE | Admit: 2022-07-20 | Discharge: 2022-07-20 | Disposition: A | Discharge status: Home / Self Care | Payer: Medicaid Other | Source: Ambulatory Visit | Attending: Physical Medicine & Rehabilitation | Admitting: Physical Medicine & Rehabilitation

## 2022-07-20 DIAGNOSIS — M25551 Pain in right hip: Secondary | ICD-10-CM

## 2022-07-20 DIAGNOSIS — M545 Low back pain, unspecified: Secondary | ICD-10-CM

## 2022-07-20 DIAGNOSIS — M549 Dorsalgia, unspecified: Secondary | ICD-10-CM

## 2022-07-20 DIAGNOSIS — M542 Cervicalgia: Secondary | ICD-10-CM

## 2022-07-27 ENCOUNTER — Ambulatory Visit: Payer: Medicaid Other | Admitting: Nurse Practitioner

## 2022-07-27 NOTE — Progress Notes (Deleted)
Assessment / Plan   Primary GI: new-       History of Present Illness   Chief Complaint:   62 y.o. yo male with a past medical history consisting of, but not necessarily limited to     Procedure risk assessment:  No history of CHF.  No supplemental 02 use at home.  Not a known difficult airway Anticoagulant:    Previous Endoscopies / Labs /  Imaging         Latest Ref Rng & Units 06/19/2022    1:03 AM 06/18/2022   12:42 AM 06/16/2022    3:05 PM  CBC  WBC 4.0 - 10.5 K/uL 8.8  7.4  10.8   Hemoglobin 13.0 - 17.0 g/dL 16.1  09.6  04.5   Hematocrit 39.0 - 52.0 % 44.4  43.9  48.2   Platelets 150 - 400 K/uL 213  210  265     Lab Results  Component Value Date   LIPASE 37 06/27/2021      Latest Ref Rng & Units 07/11/2022    3:06 PM 06/23/2022   12:59 AM 06/22/2022    1:04 AM  CMP  Glucose 70 - 99 mg/dL 94  409  811   BUN 6 - 20 mg/dL 17  24  27    Creatinine 0.61 - 1.24 mg/dL 9.14  7.82  9.56   Sodium 135 - 145 mmol/L 138  134  132   Potassium 3.5 - 5.1 mmol/L 4.3  3.9  3.5   Chloride 98 - 111 mmol/L 92  92  90   CO2 22 - 32 mmol/L 35  34  32   Calcium 8.9 - 10.3 mg/dL 9.0  8.7  8.7   Total Protein 6.5 - 8.1 g/dL 7.4     Total Bilirubin 0.3 - 1.2 mg/dL 0.9     Alkaline Phos 38 - 126 U/L 52     AST 15 - 41 U/L 35     ALT 0 - 44 U/L 28           DG Cervical Spine Complete CLINICAL DATA:  Cervicalgia. Pain in the spine and hips for years. Pain is getting worse.  EXAM: CERVICAL SPINE - COMPLETE 4+ VIEW  COMPARISON:  None Available.  FINDINGS: There is normal alignment of the cervical spine. There is moderate disc height loss with uncovertebral spurring at C5-6. There is no acute fracture or subluxation. The lung apices are clear. Prevertebral soft tissues are unremarkable.  IMPRESSION: Degenerative changes at C5-6.  No evidence for acute  abnormality.  Electronically Signed   By: Norva Pavlov M.D.   On: 07/27/2022 09:27    Past  Medical History:  Diagnosis Date   Chronic diastolic CHF (congestive heart failure) (HCC)    COPD (chronic obstructive pulmonary disease) (HCC)    Hypertension    Obesity (BMI 30-39.9) 12/15/2016   OSA (obstructive sleep apnea)    Persistent atrial fibrillation (HCC) 12/15/2016   Visit for monitoring Tikosyn therapy    Past Surgical History:  Procedure Laterality Date   CARDIAC CATHETERIZATION  02/22/2018   CARDIOVERSION N/A 12/22/2016   Procedure: CARDIOVERSION;  Surgeon: Othella Boyer, MD;  Location: [facility] ENDOSCOPY;  Service: Cardiovascular;  Laterality: N/A;   HEMORROIDECTOMY     HERNIA REPAIR     RIGHT/LEFT HEART CATH AND CORONARY ANGIOGRAPHY N/A 02/22/2018   Procedure: RIGHT/LEFT HEART CATH AND CORONARY ANGIOGRAPHY;  Surgeon: Yvonne Kendall, MD;  Location: [facility] INVASIVE CV LAB;  Service: Cardiovascular;  Laterality: N/A;   Family History  Problem Relation Age of Onset   COPD Mother    COPD Father    Atrial fibrillation Brother    CAD Brother    Social History   Tobacco Use   Smoking status: Former    Packs/day: 1.00    Years: 30.00    Additional pack years: 0.00    Total pack years: 30.00    Types: Cigarettes    Quit date: 2009    Years since quitting: 15.4    Passive exposure: Past   Smokeless tobacco: Never  Vaping Use   Vaping Use: Never used  Substance Use Topics   Alcohol use: Yes    Alcohol/week: 4.0 standard drinks of alcohol    Types: 4 Cans of beer per week    Comment: 3-4 days per week   Drug use: No   Current Outpatient Medications  Medication Sig Dispense Refill   albuterol (PROAIR HFA) 108 (90 Base) MCG/ACT inhaler TAKE 1 TO 2 PUFFS EVERY 4 TO 6 HOURS AS NEEDED FOR SHORTNESS OF BREATH. (Patient taking differently: Inhale 2 puffs into the lungs every 6 (six) hours as needed for wheezing or shortness of breath.) 8.5 g 2   albuterol (PROVENTIL) (2.5 MG/3ML) 0.083% nebulizer solution Take 2.5 mg by nebulization as needed for wheezing or shortness of  breath.     ALPRAZolam (XANAX) 0.25 MG tablet Take 1 tablet (0.25 mg total) by mouth 3 (three) times daily as needed for anxiety. 10 tablet 0   apixaban (ELIQUIS) 5 MG TABS tablet TAKE 1 TABLET BY MOUTH 2 TIMES DAILY. 180 tablet 1   Budeson-Glycopyrrol-Formoterol (BREZTRI AEROSPHERE) 160-9-4.8 MCG/ACT AERO Inhale 2 puffs into the lungs in the morning and at bedtime. 10.7 g 5   dofetilide (TIKOSYN) 500 MCG capsule TAKE 1 CAPSULE BY MOUTH 2 TIMES DAILY. (Patient taking differently: Take 500 mcg by mouth 2 (two) times daily.) 180 capsule 3   empagliflozin (JARDIANCE) 10 MG TABS tablet Take 1 tablet (10 mg total) by mouth daily. 30 tablet 1   metoprolol succinate (TOPROL-XL) 100 MG 24 hr tablet TAKE 1 TABLET (100 MG TOTAL) BY MOUTH DAILY. (Patient taking differently: Take 50 mg by mouth in the morning and at bedtime.) 90 tablet 3   montelukast (SINGULAIR) 10 MG tablet Take 10 mg by mouth at bedtime.     pantoprazole (PROTONIX) 40 MG tablet Take 1 tablet (40 mg total) by mouth daily. (Patient taking differently: Take 40 mg by mouth as needed (reflux).) 30 tablet 1   spironolactone (ALDACTONE) 25 MG tablet Take 1 tablet (25 mg total) by mouth daily. 30 tablet 1   torsemide (DEMADEX) 20 MG tablet Take 2 tablets (40 mg total) by mouth daily. 60 tablet 0   No current facility-administered medications for this visit.   No Known Allergies   Review of Systems: Positive for ***.  All other systems reviewed and negative except where noted in HPI.   Wt Readings from Last 3 Encounters:  07/11/22 (!) 302 lb (137 kg)  07/05/22 298 lb 3.2 oz (135.3 kg)  06/23/22 295 lb 10.2 oz (134.1 kg)    Physical Exam:  There were no vitals taken for this visit. Constitutional:  Pleasant, generally well appearing ***male in no acute distress. Psychiatric:  Normal mood and affect. Behavior is normal. EENT: Pupils normal.  Conjunctivae are normal. No scleral icterus. Neck supple.  Cardiovascular: Normal rate, regular  rhythm.  Pulmonary/chest: Effort normal and breath sounds  normal. No wheezing, rales or rhonchi. Abdominal: Soft, nondistended, nontender. Bowel sounds active throughout. There are no masses palpable. No hepatomegaly. Neurological: Alert and oriented to person place and time. Extremities: *** No edema Skin: Skin is warm and dry. No rashes noted.  Willette Cluster, NP  07/27/2022, 1:19 PM  Cc:  Referring Provider Kaleen Mask, *

## 2022-07-28 ENCOUNTER — Encounter: Payer: Self-pay | Admitting: Gastroenterology

## 2022-07-28 ENCOUNTER — Ambulatory Visit (INDEPENDENT_AMBULATORY_CARE_PROVIDER_SITE_OTHER): Payer: Medicaid Other | Admitting: Gastroenterology

## 2022-07-28 ENCOUNTER — Other Ambulatory Visit (INDEPENDENT_AMBULATORY_CARE_PROVIDER_SITE_OTHER): Payer: Medicaid Other

## 2022-07-28 ENCOUNTER — Ambulatory Visit (HOSPITAL_COMMUNITY): Payer: Medicaid Other

## 2022-07-28 ENCOUNTER — Ambulatory Visit: Payer: Medicaid Other | Admitting: Gastroenterology

## 2022-07-28 VITALS — BP 124/70 | HR 82 | Ht 66.0 in | Wt 301.0 lb

## 2022-07-28 DIAGNOSIS — R1084 Generalized abdominal pain: Secondary | ICD-10-CM

## 2022-07-28 LAB — SEDIMENTATION RATE: Sed Rate: 47 mm/hr — ABNORMAL HIGH (ref 0–20)

## 2022-07-28 LAB — C-REACTIVE PROTEIN: CRP: 5.7 mg/dL (ref 0.5–20.0)

## 2022-07-28 NOTE — Patient Instructions (Signed)
_______________________________________________________  If your blood pressure at your visit was 140/90 or greater, please contact your primary care physician to follow up on this.  _______________________________________________________  If you are age 61 or older, your body mass index should be between 23-30. Your Body mass index is 48.58 kg/m. If this is out of the aforementioned range listed, please consider follow up with your Primary Care Provider.  If you are age 55 or younger, your body mass index should be between 19-25. Your Body mass index is 48.58 kg/m. If this is out of the aformentioned range listed, please consider follow up with your Primary Care Provider.   Your provider has requested that you go to the basement level for lab work before leaving today. Press "B" on the elevator. The lab is located at the first door on the left as you exit the elevator.  You have been scheduled for a CT scan of the abdomen and pelvis at [facility], 1st floor Radiology. You are scheduled on 08/05/22 at 3pm. You should arrive 15 minutes prior to your appointment time for registration.    Please follow the written instructions below on the day of your exam:   1) Do not eat anything after 11am (4 hours prior to your test)   You may take any medications as prescribed with a small amount of water, if necessary. If you take any of the following medications: METFORMIN, GLUCOPHAGE, GLUCOVANCE, AVANDAMET, RIOMET, FORTAMET, ACTOPLUS MET, JANUMET, GLUMETZA or METAGLIP, you MAY be asked to HOLD this medication 48 hours AFTER the exam.   If you have any questions regarding your exam or if you need to reschedule, you may call [facility] at [phone] between the hours of 8:00 am and 5:00 pm, Monday-Friday.   Due to recent changes in healthcare laws, you may see the results of your imaging and laboratory studies on MyChart before your provider has had a chance to review them.  We  understand that in some cases there may be results that are confusing or concerning to you. Not all laboratory results come back in the same time frame and the provider may be waiting for multiple results in order to interpret others.  Please give [place] 48 hours in order for your provider to thoroughly review all the results before contacting the office for clarification of your results.    ________________________________________________________  The [facility] providers would like to encourage you to use [facility] to communicate with providers for non-urgent requests or questions.  Due to long hold times on the telephone, sending your provider a message by [facility] may be a faster and more efficient way to get a response.  Please allow 48 business hours for a response.  Please remember that this is for non-urgent requests.   It was a pleasure to see you today!  Thank you for trusting me with your gastrointestinal care!    Scott E.Tomasa Rand, MD

## 2022-07-28 NOTE — Progress Notes (Signed)
HPI : [name] [name] is a 62 y.o. male with an extensive cardiopulmonary history history to include chronic respiratory failure on O2 with exertion, severe mixed asthma/COPD, OSA on CPAP, obesity, Afib, chronic diastolic CHF, HTN and PE 05/23who is referred to [place] by Kaleen Mask, * for further evaluation of chronic abdominal pain. The patient states that he has had problems with abdominal pain for about a year now.  He describes having a chronic pain that never completely goes away, as well as episodes of severe abdominal pain associated with inability to breathe.  These episodes of severe pain occur 3-4 times per month and last for a few minutes each time.  During these episodes he feels like he is being choked, but also has severe pain in his abdomen.  The pain is localized to the lower abdomen usually. In addition to these episodes, he has a persistent abdominal discomfort that is not severe, but interferes with his quality of life.  He denies any problems with his bowel movements.  He has regular formed stools, no problems with constipation, straining or diarrhea. Eating often makes the pain better.  He denies symptoms of heartburn or acid regurgitation.  No dysphagia.  He has bouts of nausea and abdominal bloating, which is relieved with belching. He denies any NSAID use. His weight is going down, but this is because of increasing his diuretics.  He is being followed closely by cardiology for recent acute on chronic diastolic heart failure, requiring IV diuresis.  He also has persistent A-fib, but is not felt to be a candidate for ablation.  He currently has very little exercise tolerance, and gets short of breath with minimal ambulation.  He had a CT of the abdomen in May of last year which showed evidence of sigmoid diverticulitis.  Of note the patient states that his appointment with me today at 1:30 was incorrectly canceled.  He was trying to cancel a cardiology appointment, but  the administrator apparently canceled both my appointment and his cardiology appointment.   From recent cardiology note (6/4/204)   R/LHC 2020: mild CAD with 30% mid LAD, RA mean 12, PA mean 29, PCWP mean 17, LVEDP 22, Fick CO/CI 5.18/2.38   He has been followed by EP for atrial fibrillation and is on dofetilide. 53% Afib burden on 30 day monitor in February 2024.   He was admitted 06/16/22 with acute on chronic CHF. Had been started on lasix by PCP a few weeks prior with no significant improvement. Had not been compliant with CPAP, awaiting new machine. He was diuresed with IV lasix. Echo with EF 60-65%, RV low normal. Discharged home on Torsemide 40 mg daily, Jardiance 10 mg daily and metoprolol succinate 50 mg BID.    Saw EP for f/u 05/29. Has not felt well with atrial fibrillation and has wanted to proceed with ablation. However, there are concerns about his pulmonary status and would need general anesthesia prior to ablation.  Past Medical History:  Diagnosis Date   Chronic diastolic CHF (congestive heart failure) (HCC)    COPD (chronic obstructive pulmonary disease) (HCC)    Hypertension    Obesity (BMI 30-39.9) 12/15/2016   OSA (obstructive sleep apnea)    Persistent atrial fibrillation (HCC) 12/15/2016   Visit for monitoring Tikosyn therapy      Past Surgical History:  Procedure Laterality Date   CARDIAC CATHETERIZATION  02/22/2018   CARDIOVERSION N/A 12/22/2016   Procedure: CARDIOVERSION;  Surgeon: Othella Boyer, MD;  Location:  [facility] ENDOSCOPY;  Service: Cardiovascular;  Laterality: N/A;   HEMORROIDECTOMY     HERNIA REPAIR     RIGHT/LEFT HEART CATH AND CORONARY ANGIOGRAPHY N/A 02/22/2018   Procedure: RIGHT/LEFT HEART CATH AND CORONARY ANGIOGRAPHY;  Surgeon: Yvonne Kendall, MD;  Location: [facility] INVASIVE CV LAB;  Service: Cardiovascular;  Laterality: N/A;   Family History  Problem Relation Age of Onset   COPD Mother    COPD Father    Atrial fibrillation Brother    CAD  Brother    Social History   Tobacco Use   Smoking status: Former    Packs/day: 1.00    Years: 30.00    Additional pack years: 0.00    Total pack years: 30.00    Types: Cigarettes    Quit date: 2009    Years since quitting: 15.4    Passive exposure: Past   Smokeless tobacco: Never  Vaping Use   Vaping Use: Never used  Substance Use Topics   Alcohol use: Yes    Alcohol/week: 4.0 standard drinks of alcohol    Types: 4 Cans of beer per week    Comment: 3-4 days per week   Drug use: No   Current Outpatient Medications  Medication Sig Dispense Refill   albuterol (PROAIR HFA) 108 (90 Base) MCG/ACT inhaler TAKE 1 TO 2 PUFFS EVERY 4 TO 6 HOURS AS NEEDED FOR SHORTNESS OF BREATH. (Patient taking differently: Inhale 2 puffs into the lungs every 6 (six) hours as needed for wheezing or shortness of breath.) 8.5 g 2   albuterol (PROVENTIL) (2.5 MG/3ML) 0.083% nebulizer solution Take 2.5 mg by nebulization as needed for wheezing or shortness of breath.     ALPRAZolam (XANAX) 0.25 MG tablet Take 1 tablet (0.25 mg total) by mouth 3 (three) times daily as needed for anxiety. 10 tablet 0   apixaban (ELIQUIS) 5 MG TABS tablet TAKE 1 TABLET BY MOUTH 2 TIMES DAILY. 180 tablet 1   Budeson-Glycopyrrol-Formoterol (BREZTRI AEROSPHERE) 160-9-4.8 MCG/ACT AERO Inhale 2 puffs into the lungs in the morning and at bedtime. 10.7 g 5   dofetilide (TIKOSYN) 500 MCG capsule TAKE 1 CAPSULE BY MOUTH 2 TIMES DAILY. (Patient taking differently: Take 500 mcg by mouth 2 (two) times daily.) 180 capsule 3   empagliflozin (JARDIANCE) 10 MG TABS tablet Take 1 tablet (10 mg total) by mouth daily. 30 tablet 1   metoprolol succinate (TOPROL-XL) 100 MG 24 hr tablet TAKE 1 TABLET (100 MG TOTAL) BY MOUTH DAILY. (Patient taking differently: Take 50 mg by mouth in the morning and at bedtime.) 90 tablet 3   montelukast (SINGULAIR) 10 MG tablet Take 10 mg by mouth at bedtime.     pantoprazole (PROTONIX) 40 MG tablet Take 1 tablet (40 mg  total) by mouth daily. (Patient taking differently: Take 40 mg by mouth as needed (reflux).) 30 tablet 1   spironolactone (ALDACTONE) 25 MG tablet Take 1 tablet (25 mg total) by mouth daily. 30 tablet 1   torsemide (DEMADEX) 20 MG tablet Take 2 tablets (40 mg total) by mouth daily. 60 tablet 0   No current facility-administered medications for this visit.   No Known Allergies   Review of Systems: All systems reviewed and negative except where noted in HPI.    DG Cervical Spine Complete  Result Date: 07/27/2022 CLINICAL DATA:  Cervicalgia. Pain in the spine and hips for years. Pain is getting worse. EXAM: CERVICAL SPINE - COMPLETE 4+ VIEW COMPARISON:  None Available. FINDINGS: There is normal alignment of the  cervical spine. There is moderate disc height loss with uncovertebral spurring at C5-6. There is no acute fracture or subluxation. The lung apices are clear. Prevertebral soft tissues are unremarkable. IMPRESSION: Degenerative changes at C5-6.  No evidence for acute  abnormality. Electronically Signed   By: Norva Pavlov M.D.   On: 07/27/2022 09:27   DG HIPS BILAT WITH PELVIS 2V  Result Date: 07/26/2022 CLINICAL DATA:  Chronic bilateral hip pain. EXAM: DG HIP (WITH OR WITHOUT PELVIS) 2V BILAT COMPARISON:  None Available. FINDINGS: Slight bilateral hip joint space narrowing and acetabular spurring. No dislocation, femoral heads are normally located. No evidence of acute or healing/healed fracture. Bony pelvis and pubic rami are intact. Pubic symphysis and sacroiliac joints are congruent. No visible avascular necrosis or erosive change. Soft tissue attenuation from habitus partially obscures detailed assessment. IMPRESSION: Mild osteoarthritis of both hips. Electronically Signed   By: Narda Rutherford M.D.   On: 07/26/2022 18:25   DG Lumbar Spine Complete  Result Date: 07/26/2022 CLINICAL DATA:  Vertebrogenic low back pain. Patient reports diffuse spine and hip pain for years,  progressive. EXAM: LUMBAR SPINE - COMPLETE 4+ VIEW COMPARISON:  Lumbar spine CT 06/16/2022 FINDINGS: Five non-rib-bearing lumbar vertebra. Trace retrolisthesis of L5 on S1. Otherwise normal alignment. Normal vertebral body heights. No evidence of fracture or compression deformity. Multilevel spurring in the lumbar spine with slight disc space narrowing most prominent at L3-L4. Mild lower lumbar facet hypertrophy. No evidence of focal abnormality or bony destruction. IMPRESSION: 1. Mild multilevel degenerative disc disease and lower lumbar facet hypertrophy. 2. Trace retrolisthesis of L5 on S1. Electronically Signed   By: Narda Rutherford M.D.   On: 07/26/2022 18:24   DG Thoracic Spine W/Swimmers  Result Date: 07/26/2022 CLINICAL DATA:  Thoracic back pain. Patient reports diffuse spine and hip pain for years, progressive. EXAM: THORACIC SPINE - 3 VIEWS COMPARISON:  Reformats from chest CTA 06/16/2022 FINDINGS: Twelve rib-bearing thoracic vertebra. Upper thoracic spine partially obscured on the lateral view due to osseous and soft tissue overlap. Slight exaggerated upper thoracic kyphosis. Minor dextroscoliotic curvature of the lower thoracic spine. Occasional Schmorl's nodes in the midthoracic spine. No evidence of fracture or compression deformity. No visible focal bone lesion or bone destruction. IMPRESSION: 1. Multiple degenerative Schmorl's nodes. 2. Minor dextroscoliotic curvature of the lower thoracic spine. Slight exaggerated upper thoracic kyphosis. Electronically Signed   By: Narda Rutherford M.D.   On: 07/26/2022 18:21    Physical Exam: BP 124/70   Pulse 82   Ht 5\' 6"  (1.676 m)   Wt (!) 301 lb (136.5 kg)   BMI 48.58 kg/m  Constitutional: Pleasant,well-developed, obese Caucasian male in no acute distress.  Accompanied by spouse HEENT: Normocephalic and atraumatic. Conjunctivae are normal. No scleral icterus. Neck supple.  Cardiovascular: Normal rate, regular rhythm.  Pulmonary/chest: Effort  normal and breath sounds normal but decreased. No wheezing, rales or rhonchi. Abdominal: Soft, nondistended, tenderness to palpation in the left lower quadrant and infraumbilical region. Bowel sounds active throughout. There are no masses palpable. No hepatomegaly. Extremities: Bilateral trace lower extremity edema Neurological: Alert and oriented to person place and time. Skin: Skin is warm and dry. No rashes noted. Psychiatric: Normal mood and affect. Behavior is normal.  CBC    Component Value Date/Time   WBC 8.8 06/19/2022 0103   RBC 4.72 06/19/2022 0103   HGB 14.3 06/19/2022 0103   HGB 14.9 02/10/2022 1146   HCT 44.4 06/19/2022 0103   HCT 44.9 02/10/2022 1146  PLT 213 06/19/2022 0103   PLT 247 02/10/2022 1146   MCV 94.1 06/19/2022 0103   MCV 95 02/10/2022 1146   MCH 30.3 06/19/2022 0103   MCHC 32.2 06/19/2022 0103   RDW 14.1 06/19/2022 0103   RDW 12.9 02/10/2022 1146   LYMPHSABS 0.9 06/16/2022 1505   MONOABS 0.6 06/16/2022 1505   EOSABS 0.1 06/16/2022 1505   BASOSABS 0.1 06/16/2022 1505    CMP     Component Value Date/Time   NA 138 07/11/2022 1506   NA 137 02/10/2022 1146   K 4.3 07/11/2022 1506   CL 92 (L) 07/11/2022 1506   CO2 35 (H) 07/11/2022 1506   GLUCOSE 94 07/11/2022 1506   BUN 17 07/11/2022 1506   BUN 14 02/10/2022 1146   CREATININE 1.27 (H) 07/11/2022 1506   CALCIUM 9.0 07/11/2022 1506   PROT 7.4 07/11/2022 1506   ALBUMIN 3.4 (L) 07/11/2022 1506   AST 35 07/11/2022 1506   ALT 28 07/11/2022 1506   ALKPHOS 52 07/11/2022 1506   BILITOT 0.9 07/11/2022 1506   GFRNONAA >60 07/11/2022 1506   GFRAA 92 05/30/2019 1211       Latest Ref Rng & Units 06/19/2022    1:03 AM 06/18/2022   12:42 AM 06/16/2022    3:05 PM  CBC EXTENDED  WBC 4.0 - 10.5 K/uL 8.8  7.4  10.8   RBC 4.22 - 5.81 MIL/uL 4.72  4.57  5.17   Hemoglobin 13.0 - 17.0 g/dL 40.9  81.1  91.4   HCT 39.0 - 52.0 % 44.4  43.9  48.2   Platelets 150 - 400 K/uL 213  210  265   NEUT# 1.7 - 7.7 K/uL    9.1   Lymph# 0.7 - 4.0 K/uL   0.9       ASSESSMENT AND PLAN: 62 year old male with significant cardiopulmonary comorbidities as mentioned above with chronic abdominal pain interspersed with acute severe episodes of pain associated with shortness of breath.  He did have evidence of diverticulitis on the CT scan last year.  I would like to repeat a CT scan and see if there is still evidence of any chronic inflammatory change.  He may have some degree of chronic diverticulitis based on his physical exam and symptoms.  An endoscopic exam would not be unreasonable to evaluate his symptoms, however given his significant cardiopulmonary comorbidities, I feel that he is very high risk for complications from sedation.  Right now, I think the risk of complications outweigh the benefits of the evaluation.  I would prefer to do a noninvasive evaluation first and reassess his symptoms. In addition to the CT scan, we will get H. pylori stool antigen, celiac serologies, fecal elastase and inflammatory markers.  Abdominal pain - CT abdomen/pelvis  - H. Pylori stool Ag - TTG/IgA - elastase - CRP, ESR  Colon cancer screening - Defer for now, given patient's significant cardiopulmonary issues.  Aldean Pipe E. Tomasa Rand, MD [facility]   Kaleen Mask, *

## 2022-07-29 LAB — TISSUE TRANSGLUTAMINASE, IGA: (tTG) Ab, IgA: <1 U/mL

## 2022-07-29 LAB — IGA: Immunoglobulin A: 246 mg/dL (ref 47–310)

## 2022-08-02 ENCOUNTER — Ambulatory Visit (HOSPITAL_COMMUNITY): Payer: Medicaid Other

## 2022-08-03 ENCOUNTER — Other Ambulatory Visit: Payer: Medicaid Other

## 2022-08-03 DIAGNOSIS — R1084 Generalized abdominal pain: Secondary | ICD-10-CM

## 2022-08-05 ENCOUNTER — Ambulatory Visit (HOSPITAL_BASED_OUTPATIENT_CLINIC_OR_DEPARTMENT_OTHER)
Admission: RE | Admit: 2022-08-05 | Discharge: 2022-08-05 | Disposition: A | Discharge status: Home / Self Care | Payer: Medicaid Other | Source: Ambulatory Visit | Attending: Gastroenterology | Admitting: Gastroenterology

## 2022-08-05 DIAGNOSIS — R1084 Generalized abdominal pain: Secondary | ICD-10-CM | POA: Diagnosis present

## 2022-08-05 LAB — H. PYLORI ANTIGEN, STOOL: H pylori Ag, Stl: NEGATIVE

## 2022-08-05 MED ORDER — IOHEXOL 300 MG/ML  SOLN
100.0000 mL | Freq: Once | INTRAMUSCULAR | Status: AC | PRN
  Administered 2022-08-05: 100 mL via INTRAVENOUS

## 2022-08-07 ENCOUNTER — Ambulatory Visit (HOSPITAL_COMMUNITY): Payer: Medicaid Other

## 2022-08-12 LAB — PANCREATIC ELASTASE, FECAL: Pancreatic Elastase-1, Stool: 286 mcg/g

## 2022-08-13 NOTE — Progress Notes (Signed)
Mr. Widmeyer,  Your stool tests, blood tests and CT have been unrevealing for a cause of your abdominal pain.  The CT did not show any evidence of diverticulitis.  Let's try some Bentyl as needed for your pain.  This is an anti-spasmodic medication that can help with abdominal pain.   Please make a follow up office visit and we can discuss some dietary options that may help reduce your symptoms and we can also rediscuss an endoscopic evaluation.  Linda,  Please relay the above message and schedule him for a routine follow up visit.

## 2022-08-14 ENCOUNTER — Other Ambulatory Visit: Payer: Self-pay | Admitting: *Deleted

## 2022-08-14 ENCOUNTER — Telehealth: Payer: Self-pay | Admitting: *Deleted

## 2022-08-14 ENCOUNTER — Other Ambulatory Visit (HOSPITAL_COMMUNITY): Payer: Self-pay | Admitting: Physician Assistant

## 2022-08-14 MED ORDER — DICYCLOMINE HCL 20 MG PO TABS: 20.0000 mg | ORAL_TABLET | Freq: Four times a day (QID) | ORAL | 1 refills | Status: AC | PRN

## 2022-08-14 NOTE — Telephone Encounter (Signed)
-----   Message from Jenel Lucks, MD sent at 08/14/2022  2:49 PM EDT ----- 20 mg PO Q6 hours PRN #[id number], thanks ----- Message ----- From: Avanell Shackleton, RN Sent: 08/14/2022   8:36 AM EDT To: Jenel Lucks, MD  Patient is agreeable to taking the Bentyl medication for abdominal pain. Please advise about the dosage and frequency.

## 2022-08-14 NOTE — Telephone Encounter (Signed)
Patient called to notify medication has been ordered; Bentyl 20mg  to take by mouth every 6 hours as needed. Patient also informed Bentyl is for abdominal cramping and discomfort. Patient understood.

## 2022-08-15 NOTE — Progress Notes (Unsigned)
Cardiology Office Note:   Date:  08/15/2022  NAME:  [name] [name]    MRN: [id number] DOB:  18-Sep-1960   PCP:  [name] Mask, MD  Cardiologist:  [name] Jorja Loa, MD  Electrophysiologist:  [name] Lemming, MD   Referring MD: [name] [name], *   No chief complaint on file.   History of Present Illness:   [name] [name] is a 62 y.o. male with a hx of COPD, OSA, persistent Afib, HFpEF, non-obstructive CAD, HTN who is being seen today for the evaluation of HFpEF at the request of [name] Mask, MD.  ***  Problem List COPD -severe obstructive disease  HFpEF Persistent Afib  PE HTN Non-obstructive CAD -30% LAD 2020 7. OSA  Past Medical History: Past Medical History:  Diagnosis Date   Chronic diastolic CHF (congestive heart failure) (HCC)    COPD (chronic obstructive pulmonary disease) (HCC)    Hypertension    Obesity (BMI 30-39.9) 12/15/2016   OSA (obstructive sleep apnea)    Persistent atrial fibrillation (HCC) 12/15/2016   Visit for monitoring Tikosyn therapy     Past Surgical History: Past Surgical History:  Procedure Laterality Date   CARDIAC CATHETERIZATION  02/22/2018   CARDIOVERSION N/A 12/22/2016   Procedure: CARDIOVERSION;  Surgeon: Othella Boyer, MD;  Location: [facility] ENDOSCOPY;  Service: Cardiovascular;  Laterality: N/A;   HEMORROIDECTOMY     HERNIA REPAIR     RIGHT/LEFT HEART CATH AND CORONARY ANGIOGRAPHY N/A 02/22/2018   Procedure: RIGHT/LEFT HEART CATH AND CORONARY ANGIOGRAPHY;  Surgeon: Yvonne Kendall, MD;  Location: [facility] INVASIVE CV LAB;  Service: Cardiovascular;  Laterality: N/A;    Current Medications: No outpatient medications have been marked as taking for the 08/17/22 encounter (Appointment) with O'Neal, Ronnald Ramp, MD.     Allergies:    Patient has no known allergies.   Social History: Social History   Socioeconomic History   Marital status: Divorced    Spouse name: Not on file   Number of children: 1    Years of education: Not on file   Highest education level: Associate degree: occupational, Scientist, product/process development, or vocational program  Occupational History   Occupation: [employer]   Occupation: disabled  Tobacco Use   Smoking status: Former    Packs/day: 1.00    Years: 30.00    Additional pack years: 0.00    Total pack years: 30.00    Types: Cigarettes    Quit date: 2009    Years since quitting: 15.5    Passive exposure: Past   Smokeless tobacco: Never  Vaping Use   Vaping Use: Never used  Substance and Sexual Activity   Alcohol use: Yes    Alcohol/week: 4.0 standard drinks of alcohol    Types: 4 Cans of beer per week    Comment: 3-4 days per week   Drug use: No   Sexual activity: Not on file  Other Topics Concern   Not on file  Social History Narrative   Not on file   Social Determinants of Health   Financial Resource Strain: Low Risk  (06/20/2022)   Overall Financial Resource Strain (CARDIA)    Difficulty of Paying Living Expenses: Not very hard  Food Insecurity: No Food Insecurity (06/17/2022)   Hunger Vital Sign    Worried About Running Out of Food in the Last Year: Never true    Ran Out of Food in the Last Year: Never true  Transportation Needs: No Transportation Needs (06/17/2022)   PRAPARE - Transportation  Lack of Transportation (Medical): No    Lack of Transportation (Non-Medical): No  Physical Activity: Not on file  Stress: Not on file  Social Connections: Not on file     Family History: The patient's family history includes Atrial fibrillation in his brother; CAD in his brother; COPD in his father and mother. There is no history of Colon cancer, Stomach cancer, or Esophageal cancer.  ROS:   All other ROS reviewed and negative. Pertinent positives noted in the HPI.     EKGs/Labs/Other Studies Reviewed:   The following studies were personally reviewed by me today:  EKG:  EKG is *** ordered today.        TTE 06/18/2022  1. Left ventricular ejection fraction, by  estimation, is 60 to 65%. The  left ventricle has normal function. The left ventricle has no regional  wall motion abnormalities. There is mild concentric left ventricular  hypertrophy. Left ventricular diastolic  parameters were normal.   2. Right ventricular systolic function is low normal. The right  ventricular size is mildly enlarged. There is normal pulmonary artery  systolic pressure.   3. Right atrial size was mildly dilated.   4. The mitral valve is normal in structure. No evidence of mitral valve  regurgitation.   5. The aortic valve is tricuspid. Aortic valve regurgitation is not  visualized.   6. The inferior vena cava is normal in size with greater than 50%  respiratory variability, suggesting right atrial pressure of 3 mmHg.   LHC/RHC 02/22/2018 Conclusions: Mild, non-obstructive coronary artery disease. Moderately to severely elevated left heart, right heart, and pulmonary artery pressures.  Hemodynamics suggest restrictive physiology. Significant respiratory variation noted in intracardiac pressures, which can be seen with obstructive sleep apnea. Low normal to mildly reduced Fick cardiac output/index.  Recent Labs: 02/10/2022: NT-Pro BNP 504 06/17/2022: TSH 6.189 06/19/2022: Hemoglobin 14.3; Platelets 213 06/21/2022: Magnesium 2.4 07/11/2022: ALT 28; B Natriuretic Peptide 76.8; BUN 17; Creatinine, Ser 1.27; Potassium 4.3; Sodium 138   Recent Lipid Panel    Component Value Date/Time   CHOL 183 02/08/2018 1319   TRIG 125 02/08/2018 1319   HDL 47 02/08/2018 1319   CHOLHDL 3.9 02/08/2018 1319   LDLCALC 111 (H) 02/08/2018 1319    Physical Exam:   VS:  There were no vitals taken for this visit.   Wt Readings from Last 3 Encounters:  07/28/22 (!) 301 lb (136.5 kg)  07/11/22 (!) 302 lb (137 kg)  07/05/22 298 lb 3.2 oz (135.3 kg)    General: Well nourished, well developed, in no acute distress Head: Atraumatic, normal size  Eyes: PEERLA, EOMI  Neck: Supple, no  JVD Endocrine: No thryomegaly Cardiac: Normal S1, S2; RRR; no murmurs, rubs, or gallops Lungs: Clear to auscultation bilaterally, no wheezing, rhonchi or rales  Abd: Soft, nontender, no hepatomegaly  Ext: No edema, pulses 2+ Musculoskeletal: No deformities, BUE and BLE strength normal and equal Skin: Warm and dry, no rashes   Neuro: Alert and oriented to person, place, time, and situation, CNII-XII grossly intact, no focal deficits  Psych: Normal mood and affect   ASSESSMENT:   [name] [name] is a 62 y.o. male who presents for the following: No diagnosis found.  PLAN:   There are no diagnoses linked to this encounter.  {Are you ordering a CV Procedure (e.g. stress test, cath, DCCV, TEE, etc)?   Press F2        :[id number]}  Disposition: No follow-ups on file.  Medication Adjustments/Labs and Tests  Ordered: Current medicines are reviewed at length with the patient today.  Concerns regarding medicines are outlined above.  No orders of the defined types were placed in this encounter.  No orders of the defined types were placed in this encounter.  There are no Patient Instructions on file for this visit.   Time Spent with Patient: I have spent a total of *** minutes with patient reviewing hospital notes, telemetry, EKGs, labs and examining the patient as well as establishing an assessment and plan that was discussed with the patient.  > 50% of time was spent in direct patient care.  Signed, Lenna Gilford. Flora Lipps, MD, [facility]  [facility]  [address] [place], [place] [address] [phone]  08/15/2022 6:39 PM

## 2022-08-17 ENCOUNTER — Encounter: Payer: Self-pay | Admitting: Cardiovascular Disease

## 2022-08-17 ENCOUNTER — Ambulatory Visit: Payer: Medicaid Other | Attending: Cardiovascular Disease | Admitting: Cardiovascular Disease

## 2022-08-17 VITALS — BP 108/68 | HR 88 | Ht 66.0 in | Wt 298.4 lb

## 2022-08-17 DIAGNOSIS — R0602 Shortness of breath: Secondary | ICD-10-CM

## 2022-08-17 DIAGNOSIS — J441 Chronic obstructive pulmonary disease with (acute) exacerbation: Secondary | ICD-10-CM

## 2022-08-17 DIAGNOSIS — I5032 Chronic diastolic (congestive) heart failure: Secondary | ICD-10-CM | POA: Diagnosis not present

## 2022-08-17 DIAGNOSIS — I480 Paroxysmal atrial fibrillation: Secondary | ICD-10-CM | POA: Diagnosis not present

## 2022-08-17 DIAGNOSIS — I150 Renovascular hypertension: Secondary | ICD-10-CM

## 2022-08-17 MED ORDER — PREDNISONE 20 MG PO TABS: 20.0000 mg | ORAL_TABLET | Freq: Every day | ORAL | 0 refills | Status: AC

## 2022-08-17 NOTE — Patient Instructions (Addendum)
Medication Instructions:  Your physician has recommended you make the following change in your medication: Prednisone 20 mg for 5 days  *If you need a refill on your cardiac medications before your next appointment, please call your pharmacy*   Follow-Up: At [facility], you and your health needs are our priority.  As part of our continuing mission to provide you with exceptional heart care, we have created designated Provider Care Teams.  These Care Teams include your primary Cardiologist (physician) and Advanced Practice Providers (APPs -  Physician Assistants and Nurse Practitioners) who all work together to provide you with the care you need, when you need it.  We recommend signing up for the patient portal called "MyChart".  Sign up information is provided on this After Visit Summary.  MyChart is used to connect with patients for Virtual Visits (Telemedicine).  Patients are able to view lab/test results, encounter notes, upcoming appointments, etc.  Non-urgent messages can be sent to your provider as well.   To learn more about what you can do with MyChart, go to [email or web address].    Your next appointment:   6 month(s)  Provider:   Dr. Flora Lipps  Other Instructions Dr. Flora Lipps suggest that you follow up with your pulmonologist soon.

## 2022-08-22 ENCOUNTER — Encounter (HOSPITAL_COMMUNITY): Payer: Self-pay

## 2022-08-22 ENCOUNTER — Ambulatory Visit (HOSPITAL_COMMUNITY): Payer: Medicaid Other

## 2022-09-06 ENCOUNTER — Other Ambulatory Visit (HOSPITAL_COMMUNITY): Payer: Self-pay | Admitting: Cardiology

## 2022-09-12 ENCOUNTER — Other Ambulatory Visit (HOSPITAL_COMMUNITY): Payer: Self-pay | Admitting: Physician Assistant

## 2022-09-13 ENCOUNTER — Other Ambulatory Visit (HOSPITAL_COMMUNITY): Payer: Self-pay

## 2022-09-13 MED ORDER — EMPAGLIFLOZIN 10 MG PO TABS: 10.0000 mg | ORAL_TABLET | Freq: Every day | ORAL | 1 refills | Status: DC

## 2022-10-12 ENCOUNTER — Other Ambulatory Visit (HOSPITAL_COMMUNITY): Payer: Self-pay | Admitting: Physician Assistant

## 2022-10-20 ENCOUNTER — Other Ambulatory Visit: Payer: Self-pay | Admitting: Pulmonary Disease

## 2022-10-23 ENCOUNTER — Other Ambulatory Visit: Payer: Self-pay | Admitting: Cardiology

## 2022-10-23 DIAGNOSIS — I4819 Other persistent atrial fibrillation: Secondary | ICD-10-CM

## 2022-10-23 NOTE — Telephone Encounter (Signed)
Prescription refill request for Eliquis received. Indication:afib Last office visit:7/24 Scr:1.27  6/24 Age: 62 Weight:135.4  kg  Prescription refilled

## 2022-11-10 ENCOUNTER — Other Ambulatory Visit (HOSPITAL_COMMUNITY): Payer: Self-pay | Admitting: Physician Assistant

## 2022-11-14 ENCOUNTER — Ambulatory Visit: Payer: Medicaid Other | Admitting: Gastroenterology

## 2022-11-28 ENCOUNTER — Other Ambulatory Visit (HOSPITAL_COMMUNITY): Payer: Self-pay | Admitting: Physician Assistant

## 2022-11-28 NOTE — Telephone Encounter (Signed)
This is a CHF patient please address.

## 2022-12-27 ENCOUNTER — Other Ambulatory Visit (HOSPITAL_COMMUNITY): Payer: Self-pay | Admitting: Physician Assistant

## 2023-01-25 ENCOUNTER — Other Ambulatory Visit (HOSPITAL_COMMUNITY): Payer: Self-pay | Admitting: Physician Assistant

## 2023-02-06 ENCOUNTER — Encounter (HOSPITAL_COMMUNITY): Payer: Medicaid Other

## 2023-02-13 ENCOUNTER — Other Ambulatory Visit: Payer: Self-pay | Admitting: Cardiology

## 2023-02-23 ENCOUNTER — Other Ambulatory Visit (HOSPITAL_COMMUNITY): Payer: Self-pay | Admitting: Physician Assistant

## 2023-03-03 ENCOUNTER — Other Ambulatory Visit: Payer: Self-pay | Admitting: Cardiology

## 2023-04-18 ENCOUNTER — Other Ambulatory Visit: Payer: Self-pay | Admitting: Cardiology

## 2023-04-18 DIAGNOSIS — I4819 Other persistent atrial fibrillation: Secondary | ICD-10-CM

## 2023-04-18 NOTE — Telephone Encounter (Signed)
 Eliquis 5mg  refill request received. Patient is 63 years old, weight-135.kg, Crea-1.27 on 07/11/22, Diagnosis-Afib, and last seen by Dr. Flora Lipps on 08/17/22. Dose is appropriate based on dosing criteria. Will send in refill to requested pharmacy.

## 2023-05-25 ENCOUNTER — Encounter: Payer: Self-pay | Admitting: Cardiovascular Disease

## 2023-08-08 ENCOUNTER — Other Ambulatory Visit: Payer: Self-pay | Admitting: Cardiovascular Disease

## 2023-09-23 NOTE — Progress Notes (Deleted)
  Cardiology Office Note:  .   Date:  09/23/2023  ID:  [name] [name], DOB 1960/12/23, MRN [id number] PCP: Loring Tanda Mae, MD  [facility] Providers Cardiologist:  Will Gladis Norton, MD Electrophysiologist:  Will Gladis Norton, MD { Click to update primary MD,subspecialty MD or APP then REFRESH:1}   History of Present Illness: .   No chief complaint on file.   [name] [name] is a 63 y.o. male with history of COPD, HFpEF, pHTN, CAD who presents for follow-up.      Problem List COPD -severe obstructive disease  HFpEF/pHTN -RHC 02/22/2018 PCWP 30, mPAP 37 mmHG Persistent Afib  PE HTN Non-obstructive CAD -30% LAD 2020 7. OSA    ROS: All other ROS reviewed and negative. Pertinent positives noted in the HPI.     Studies Reviewed: SABRA       TTE 06/18/2022  1. Left ventricular ejection fraction, by estimation, is 60 to 65%. The  left ventricle has normal function. The left ventricle has no regional  wall motion abnormalities. There is mild concentric left ventricular  hypertrophy. Left ventricular diastolic  parameters were normal.   2. Right ventricular systolic function is low normal. The right  ventricular size is mildly enlarged. There is normal pulmonary artery  systolic pressure.   3. Right atrial size was mildly dilated.   4. The mitral valve is normal in structure. No evidence of mitral valve  regurgitation.   5. The aortic valve is tricuspid. Aortic valve regurgitation is not  visualized.   6. The inferior vena cava is normal in size with greater than 50%  respiratory variability, suggesting right atrial pressure of 3 mmHg.  Physical Exam:   VS:  There were no vitals taken for this visit.   Wt Readings from Last 3 Encounters:  08/17/22 298 lb 6.4 oz (135.4 kg)  07/28/22 (!) 301 lb (136.5 kg)  07/11/22 (!) 302 lb (137 kg)    GEN: Well nourished, well developed in no acute distress NECK: No JVD; No carotid bruits CARDIAC: ***RRR, no murmurs,  rubs, gallops RESPIRATORY:  Clear to auscultation without rales, wheezing or rhonchi  ABDOMEN: Soft, non-tender, non-distended EXTREMITIES:  No edema; No deformity  ASSESSMENT AND PLAN: .   ***    {Are you ordering a CV Procedure (e.g. stress test, cath, DCCV, TEE, etc)?   Press F2        :[id number]}   Follow-up: No follow-ups on file.  Time Spent with Patient: I have spent a total of *** minutes caring for this patient today face to face, ordering and reviewing labs/tests, reviewing prior records/medical history, examining the patient, establishing an assessment and plan, communicating results/findings to the patient/family, and documenting in the medical record.   Signed, Darryle DASEN. Barbaraann, MD, [facility]  [facility]  [address] [place], [place] [address] [phone]  12:53 PM

## 2023-09-24 ENCOUNTER — Ambulatory Visit: Admitting: Cardiovascular Disease

## 2023-09-24 DIAGNOSIS — I5032 Chronic diastolic (congestive) heart failure: Secondary | ICD-10-CM

## 2023-09-24 DIAGNOSIS — I150 Renovascular hypertension: Secondary | ICD-10-CM

## 2023-09-24 DIAGNOSIS — R0602 Shortness of breath: Secondary | ICD-10-CM

## 2023-09-24 DIAGNOSIS — I480 Paroxysmal atrial fibrillation: Secondary | ICD-10-CM

## 2023-10-15 ENCOUNTER — Other Ambulatory Visit: Payer: Self-pay | Admitting: Cardiovascular Disease

## 2023-10-15 DIAGNOSIS — I4819 Other persistent atrial fibrillation: Secondary | ICD-10-CM

## 2023-10-15 NOTE — Telephone Encounter (Signed)
 Prescription refill request for Eliquis  received. Indication: AF Last office visit: 08/17/22  LELON Decent MD Scr: 1.2 on 04/23/23  [facility] Age: 63 Weight: 135.4kg  Based on above findings Eliquis  5mg  twice daily is the appropriate dose.  Refill approved.

## 2023-11-05 ENCOUNTER — Other Ambulatory Visit: Payer: Self-pay | Admitting: Cardiovascular Disease

## 2023-11-07 ENCOUNTER — Other Ambulatory Visit: Payer: Self-pay | Admitting: Cardiovascular Disease

## 2023-12-11 NOTE — Progress Notes (Deleted)
  Cardiology Office Note:  .   Date:  12/11/2023  ID:  Silver KATHEE Husband, DOB 1960/08/19, MRN [id number] PCP: Loring Tanda Mae, MD  [facility] Providers Cardiologist:  Will Gladis Norton, MD Electrophysiologist:  Will Gladis Norton, MD { Click to update primary MD,subspecialty MD or APP then REFRESH:1}   History of Present Illness: .   No chief complaint on file.   [name] [name] is a 63 y.o. male with history of HFpEF, COPD, non-obstructive CAD, HLD who presents for follow-up.      Problem List COPD -severe obstructive disease  HFpEF/pHTN -RHC 02/22/2018 PCWP 30, mPAP 37 mmHG Persistent Afib  -On tikosyn  PE HTN Non-obstructive CAD -30% LAD 2020 7. OSA    ROS: All other ROS reviewed and negative. Pertinent positives noted in the HPI.     Studies Reviewed: SABRA       TTE 03/08/2022  1. Left ventricular ejection fraction, by estimation, is 60 to 65%. The  left ventricle has normal function. The left ventricle has no regional  wall motion abnormalities. Left ventricular diastolic parameters are  consistent with Grade I diastolic  dysfunction (impaired relaxation). The average left ventricular global  longitudinal strain is -17.9 %. The global longitudinal strain is normal.   2. Right ventricular systolic function is normal. The right ventricular  size is mildly enlarged. There is normal pulmonary artery systolic  pressure.   3. Right atrial size was mildly dilated.   4. The mitral valve is normal in structure. No evidence of mitral valve  regurgitation. No evidence of mitral stenosis.   5. The aortic valve is normal in structure. Aortic valve regurgitation is  not visualized. No aortic stenosis is present.   6. The inferior vena cava is normal in size with greater than 50%  respiratory variability, suggesting right atrial pressure of 3 mmHg.   LHC/RHC 02/22/2018 Conclusions: Mild, non-obstructive coronary artery disease. Moderately to severely elevated  left heart, right heart, and pulmonary artery pressures.  Hemodynamics suggest restrictive physiology. Significant respiratory variation noted in intracardiac pressures, which can be seen with obstructive sleep apnea. Low normal to mildly reduced Fick cardiac output/index. Physical Exam:   VS:  There were no vitals taken for this visit.   Wt Readings from Last 3 Encounters:  08/17/22 298 lb 6.4 oz (135.4 kg)  07/28/22 (!) 301 lb (136.5 kg)  07/11/22 (!) 302 lb (137 kg)    GEN: Well nourished, well developed in no acute distress NECK: No JVD; No carotid bruits CARDIAC: ***RRR, no murmurs, rubs, gallops RESPIRATORY:  Clear to auscultation without rales, wheezing or rhonchi  ABDOMEN: Soft, non-tender, non-distended EXTREMITIES:  No edema; No deformity  ASSESSMENT AND PLAN: .   ***    {Are you ordering a CV Procedure (e.g. stress test, cath, DCCV, TEE, etc)?   Press F2        :[id number]}   Follow-up: No follow-ups on file.  Time Spent with Patient: I have spent a total of *** minutes caring for this patient today face to face, ordering and reviewing labs/tests, reviewing prior records/medical history, examining the patient, establishing an assessment and plan, communicating results/findings to the patient/family, and documenting in the medical record.   Signed, Darryle DASEN. Barbaraann, MD, [facility]  [facility]  [address] [place], [place] [address] [phone]  11:19 AM

## 2023-12-13 ENCOUNTER — Ambulatory Visit: Attending: Cardiovascular Disease | Admitting: Cardiovascular Disease

## 2023-12-13 DIAGNOSIS — I5032 Chronic diastolic (congestive) heart failure: Secondary | ICD-10-CM

## 2023-12-13 DIAGNOSIS — I4819 Other persistent atrial fibrillation: Secondary | ICD-10-CM

## 2023-12-13 DIAGNOSIS — I150 Renovascular hypertension: Secondary | ICD-10-CM

## 2024-01-02 ENCOUNTER — Other Ambulatory Visit: Payer: Self-pay | Admitting: Cardiovascular Disease

## 2024-02-06 ENCOUNTER — Other Ambulatory Visit: Payer: Self-pay | Admitting: Cardiovascular Disease

## 2024-02-22 ENCOUNTER — Other Ambulatory Visit: Payer: Self-pay | Admitting: Cardiovascular Disease

## 2024-02-25 NOTE — Telephone Encounter (Signed)
 In accordance with refill protocols, please review and address the following requirements before this medication refill can be authorized:  Labs

## 2024-03-11 ENCOUNTER — Other Ambulatory Visit: Payer: Self-pay | Admitting: Cardiovascular Disease

## 2024-03-14 MED ORDER — DOFETILIDE 500 MCG PO CAPS: 500.0000 ug | ORAL_CAPSULE | Freq: Two times a day (BID) | ORAL | 1 refills | Status: AC

## 2024-03-14 NOTE — Addendum Note (Signed)
 Addended by: BLUFORD RAMP D on: 03/14/2024 03:39 PM   Modules accepted: Orders

## 2024-03-14 NOTE — Telephone Encounter (Signed)
 Pt's medication was resent to pt's pharmacy, enough medication to get pt to his appt on 05/09/2024 with Cardiologist. Confirmation received.

## 2024-05-09 ENCOUNTER — Ambulatory Visit: Admitting: Emergency Medicine
